# Patient Record
Sex: Male | Born: 1960
Health system: Southern US, Community
[De-identification: ages and names within clinical notes are randomized; demographics above are authoritative.]

## PROBLEM LIST (undated history)

## (undated) DIAGNOSIS — E7800 Pure hypercholesterolemia, unspecified: Secondary | ICD-10-CM

## (undated) DIAGNOSIS — I10 Essential (primary) hypertension: Secondary | ICD-10-CM

## (undated) DIAGNOSIS — E785 Hyperlipidemia, unspecified: Secondary | ICD-10-CM

## (undated) HISTORY — PX: APPENDECTOMY: SHX54

---

## 2010-01-04 ENCOUNTER — Encounter: Admission: RE | Admit: 2010-01-04 | Discharge: 2010-01-27 | Payer: Self-pay | Admitting: Internal Medicine

## 2010-10-09 ENCOUNTER — Ambulatory Visit: Payer: Self-pay | Admitting: Family Medicine

## 2010-10-09 DIAGNOSIS — J069 Acute upper respiratory infection, unspecified: Secondary | ICD-10-CM | POA: Insufficient documentation

## 2010-10-09 DIAGNOSIS — E785 Hyperlipidemia, unspecified: Secondary | ICD-10-CM

## 2010-10-11 ENCOUNTER — Telehealth (INDEPENDENT_AMBULATORY_CARE_PROVIDER_SITE_OTHER): Payer: Self-pay

## 2010-12-27 NOTE — Progress Notes (Signed)
Summary: Courtesy call  Phone Note Outgoing Call   Call placed by: Areta Haber CMA,  October 11, 2010 12:10 PM Call placed to: Patient Summary of Call: Courtesy call to pt - Wife states spouse emailed Dr. Cathren Harsh re cough. She does not know response. Wife stated she would let him know that we had called. Initial call taken by: Areta Haber CMA,  October 11, 2010 12:17 PM

## 2010-12-27 NOTE — Assessment & Plan Note (Signed)
Summary: Runny nose.cough - clear,dry, sinus press/HA/ST x 3 dys rm 5   Vital Signs:  Patient Profile:   50 Years Old Male CC:      Cold & URI symptoms Height:     73 inches Weight:      173 pounds O2 Sat:      100 % O2 treatment:    Oxygen Temp:     98.4 degrees F oral Pulse rate:   81 / minute Pulse rhythm:   regular Resp:     18 per minute BP sitting:   144 / 89  (left arm) Cuff size:   regular  Vitals Entered By: Areta Haber CMA (October 09, 2010 12:16 PM)                  Current Allergies: No known allergies History of Present Illness Chief Complaint: Cold & URI symptoms History of Present Illness:    Subjective: Patient complains of onset of URI symptoms 3 days ago with mild sore throat and nasal congestion, followed by a mild cough which is now worse at night.  His wife developed similar symptoms several days earlier.   No pleuritic pain No wheezing ? post-nasal drainage No sinus pain/pressure No itchy/red eyes No earache No hemoptysis No SOB No fever/chills No nausea No vomiting No abdominal pain No diarrhea No skin rashes + mild fatigue No myalgias No headache    Current Problems: UPPER RESPIRATORY INFECTION, VIRAL (ICD-465.9) FAMILY HISTORY OF CAD MALE 1ST DEGREE RELATIVE <60 (ICD-V16.49) HYPERLIPIDEMIA (ICD-272.4)   Current Meds LIPITOR 10 MG TABS (ATORVASTATIN CALCIUM) 1 tab by mouth once daily MUCINEX DM 30-600 MG XR12H-TAB (DEXTROMETHORPHAN-GUAIFENESIN) as directed BENZONATATE 200 MG CAPS (BENZONATATE) One by mouth hs as needed cough  REVIEW OF SYSTEMS Constitutional Symptoms       Complains of fatigue.     Denies fever, chills, night sweats, weight loss, and weight gain.  Eyes       Denies change in vision, eye pain, eye discharge, glasses, contact lenses, and eye surgery. Ear/Nose/Throat/Mouth       Complains of sinus problems and sore throat.      Denies hearing loss/aids, change in hearing, ear pain, ear discharge,  dizziness, frequent runny nose, frequent nose bleeds, hoarseness, and tooth pain or bleeding.      Comments: x 3 dys Respiratory       Complains of dry cough.      Denies productive cough, wheezing, shortness of breath, asthma, bronchitis, and emphysema/COPD.  Cardiovascular       Denies murmurs, chest pain, and tires easily with exhertion.    Gastrointestinal       Denies stomach pain, nausea/vomiting, diarrhea, constipation, blood in bowel movements, and indigestion. Genitourniary       Denies painful urination, kidney stones, and loss of urinary control. Neurological       Complains of headaches.      Denies paralysis, seizures, and fainting/blackouts. Musculoskeletal       Denies muscle pain, joint pain, joint stiffness, decreased range of motion, redness, swelling, muscle weakness, and gout.  Skin       Denies bruising, unusual mles/lumps or sores, and hair/skin or nail changes.  Psych       Denies mood changes, temper/anger issues, anxiety/stress, speech problems, depression, and sleep problems. Other Comments: Pt has not seen his PCP for this.   Past History:  Past Medical History: Hyperlipidemia  Past Surgical History: Appendectomy  Family History: Family History of Arthritis Family  History of CAD Male 1st degree relative <60 Family History High cholesterol Family History Hypertension Family History Kidney disease  Social History: Married Never Smoked Alcohol use-yes - 1 weekly Drug use-no Regular exercise-yes Smoking Status:  never Does Patient Exercise:  yes Drug Use:  no   Objective:  Appearance:  Patient appears healthy, stated age, and in no acute distress  Eyes:  Pupils are equal, round, and reactive to light and accomdation.  Extraocular movement is intact.  Conjunctivae are not inflamed.  Ears:  Canals normal.  Tympanic membranes normal.   Nose:  Normal septum.  Normal turbinates, mildly congested.    No sinus tenderness present. Pharynx:  Normal    Neck:  Supple.  No adenopathy is present.  No thyromegaly is present  Lungs:  Clear to auscultation.  Breath sounds are equal.  Heart:  Regular rate and rhythm without murmurs, rubs, or gallops.  Abdomen:  Nontender without masses or hepatosplenomegaly.  Bowel sounds are present.  No CVA or flank tenderness.  Extremities:  No edema.   Skin:  No rash Assessment New Problems: UPPER RESPIRATORY INFECTION, VIRAL (ICD-465.9) FAMILY HISTORY OF CAD MALE 1ST DEGREE RELATIVE <60 (ICD-V16.49) HYPERLIPIDEMIA (ICD-272.4)  NO EVIDENCE BACTERIAL INFECTION TODAY  Plan New Medications/Changes: BENZONATATE 200 MG CAPS (BENZONATATE) One by mouth hs as needed cough  #12 x 0, 10/09/2010, Donna Christen MD  New Orders: New Patient Level III [phone] Planning Comments:   Treat symptomatically for now:  Increase fluid intake, begin expectorant/decongestant, cough suppressant at bedtime.  Call if not improving within one week or if symptoms worsen.   The patient and/or caregiver has been counseled thoroughly with regard to medications prescribed including dosage, schedule, interactions, rationale for use, and possible side effects and they verbalize understanding.  Diagnoses and expected course of recovery discussed and will return if not improved as expected or if the condition worsens. Patient and/or caregiver verbalized understanding.  Prescriptions: BENZONATATE 200 MG CAPS (BENZONATATE) One by mouth hs as needed cough  #12 x 0   Entered and Authorized by:   Donna Christen MD   Signed by:   Donna Christen MD on 10/09/2010   Method used:   Print then Give to Patient   RxID:   [id number]   Patient Instructions: 1)  May use Mucinex D (guaifenesin with decongestant) twice daily for congestion. 2)  Increase fluid intake, rest. 3)  May use Afrin nasal spray (or generic oxymetazoline) twice daily for about 5 days.  Also recommend using saline nasal spray several times daily and/or saline nasal  irrigation. 4)  Call if not improving 7 to 10 days.  Orders Added: 1)  New Patient Level III [95621]

## 2011-02-03 ENCOUNTER — Encounter: Payer: Self-pay | Admitting: Emergency Medicine

## 2011-02-03 ENCOUNTER — Ambulatory Visit (INDEPENDENT_AMBULATORY_CARE_PROVIDER_SITE_OTHER): Payer: Commercial Managed Care - PPO | Admitting: Emergency Medicine

## 2011-02-03 DIAGNOSIS — J069 Acute upper respiratory infection, unspecified: Secondary | ICD-10-CM

## 2011-02-03 LAB — CONVERTED CEMR LAB: Rapid Strep: NEGATIVE

## 2011-02-04 ENCOUNTER — Encounter: Payer: Self-pay | Admitting: Emergency Medicine

## 2011-02-06 ENCOUNTER — Telehealth (INDEPENDENT_AMBULATORY_CARE_PROVIDER_SITE_OTHER): Payer: Self-pay | Admitting: *Deleted

## 2011-02-07 NOTE — Assessment & Plan Note (Signed)
Summary: SORE THROAT, HEADACHE Room 5   Vital Signs:  Patient Profile:   50 Years Old Male CC:      Sore throat, hedache x 3 days Height:     73 inches Weight:      165 pounds O2 Sat:      100 % O2 treatment:    Room Air Temp:     98.0 degrees F oral Pulse rate:   73 / minute Pulse rhythm:   regular Resp:     12 per minute BP sitting:   147 / 91  (left arm) Cuff size:   regular  Vitals Entered By: Emilio Math (February 03, 2011 10:22 AM)                  Current Allergies: No known allergies History of Present Illness History from: patient Chief Complaint: Sore throat, hedache x 3 days History of Present Illness: 50 Years Old Male complains of onset of cold symptoms for 3 days.  Larry [name] has been using no OTC meds. ++ sore throat No cough No pleuritic pain No wheezing + nasal congestion + post-nasal drainage No sinus pain/pressure No chest congestion No itchy/red eyes No earache No hemoptysis No SOB No chills/sweats No fever No nausea No vomiting No abdominal pain No diarrhea No skin rashes No fatigue No myalgias No headache   REVIEW OF SYSTEMS Constitutional Symptoms      Denies fever, chills, night sweats, weight loss, weight gain, and fatigue.  Eyes       Denies change in vision, eye pain, eye discharge, glasses, contact lenses, and eye surgery. Ear/Nose/Throat/Mouth       Complains of sore throat.      Denies hearing loss/aids, change in hearing, ear pain, ear discharge, dizziness, frequent runny nose, frequent nose bleeds, sinus problems, hoarseness, and tooth pain or bleeding.  Respiratory       Denies dry cough, productive cough, wheezing, shortness of breath, asthma, bronchitis, and emphysema/COPD.  Cardiovascular       Denies murmurs, chest pain, and tires easily with exhertion.    Gastrointestinal       Denies stomach pain, nausea/vomiting, diarrhea, constipation, blood in bowel movements, and indigestion. Genitourniary       Denies painful  urination, kidney stones, and loss of urinary control. Neurological       Complains of headaches.      Denies paralysis, seizures, and fainting/blackouts. Musculoskeletal       Denies muscle pain, joint pain, joint stiffness, decreased range of motion, redness, swelling, muscle weakness, and gout.  Skin       Denies bruising, unusual mles/lumps or sores, and hair/skin or nail changes.  Psych       Denies mood changes, temper/anger issues, anxiety/stress, speech problems, depression, and sleep problems.  Past History:  Past Medical History: Reviewed history from 10/09/2010 and no changes required. Hyperlipidemia  Past Surgical History: Reviewed history from 10/09/2010 and no changes required. Appendectomy  Family History: Reviewed history from 10/09/2010 and no changes required. Family History of Arthritis Family History of CAD Male 1st degree relative <60 Family History High cholesterol Family History Hypertension Family History Kidney disease  Social History: Reviewed history from 10/09/2010 and no changes required. Married Never Smoked Alcohol use-yes - 1 weekly Drug use-no Regular exercise-yes [place] Physical Exam General appearance: well developed, well nourished, no acute distress Ears: normal, no lesions or deformities Nasal: mucosa pink, nonedematous, no septal deviation, turbinates normal Oral/Pharynx: tongue normal, posterior pharynx  without erythema or exudate Chest/Lungs: no rales, wheezes, or rhonchi bilateral, breath sounds equal without effort Heart: regular rate and  rhythm, no murmur MSE: oriented to time, place, and person Assessment New Problems: UPPER RESPIRATORY INFECTION, ACUTE (ICD-465.9)   Plan New Medications/Changes: AMOXICILLIN 875 MG TABS (AMOXICILLIN) 1 by mouth two times a day for 7 days  #14 x 0, 02/03/2011, Hoyt Koch MD  New Orders: Est. Patient Level IV [16109] Rapid Strep [87880] T-Culture, Throat  [[id number]] Planning Comments:   Rapid strep negative 1)  Take the prescribed antibiotic as instructed.  Hold for a few days since is currently just viral. 2)  Use nasal saline solution (over the counter) at least 3 times a day. 3)  Use over the counter decongestants like Zyrtec-D every 12 hours as needed to help with congestion. 4)  Can take tylenol every 6 hours or motrin every 8 hours for pain or fever. 5)  Follow up with your primary doctor  if no improvement in 5-7 days, sooner if increasing pain, fever, or new symptoms.    The patient and/or caregiver has been counseled thoroughly with regard to medications prescribed including dosage, schedule, interactions, rationale for use, and possible side effects and they verbalize understanding.  Diagnoses and expected course of recovery discussed and will return if not improved as expected or if the condition worsens. Patient and/or caregiver verbalized understanding.  Prescriptions: AMOXICILLIN 875 MG TABS (AMOXICILLIN) 1 by mouth two times a day for 7 days  #14 x 0   Entered and Authorized by:   Hoyt Koch MD   Signed by:   Hoyt Koch MD on 02/03/2011   Method used:   Print then Give to Patient   RxID:   [id number]   Orders Added: 1)  Est. Patient Level IV [78469] 2)  Rapid Strep [62952] 3)  T-Culture, Throat [[id number]]    Laboratory Results  Date/Time Received: February 03, 2011 10:41 AM  Date/Time Reported: February 03, 2011 10:41 AM   Other Tests  Rapid Strep: negative

## 2011-02-14 NOTE — Progress Notes (Signed)
  Phone Note Outgoing Call Call back at Home Phone [phone] [facility]     Call placed by: Lajean Saver RN,  February 06, 2011 3:24 PM Call placed to: Patient Summary of Call: Patient unavailable. Spoke with patient's wife who reports he is doing better after fill his Rx. I gave her his negative throat culture result

## 2014-04-15 ENCOUNTER — Emergency Department
Admission: EM | Admit: 2014-04-15 | Discharge: 2014-04-15 | Disposition: A | Discharge status: Home / Self Care | Payer: 59 | Source: Home / Self Care | Attending: Family Medicine | Admitting: Family Medicine

## 2014-04-15 ENCOUNTER — Encounter: Payer: Self-pay | Admitting: Emergency Medicine

## 2014-04-15 DIAGNOSIS — B9789 Other viral agents as the cause of diseases classified elsewhere: Principal | ICD-10-CM

## 2014-04-15 DIAGNOSIS — J069 Acute upper respiratory infection, unspecified: Secondary | ICD-10-CM

## 2014-04-15 HISTORY — DX: Hyperlipidemia, unspecified: E78.5

## 2014-04-15 LAB — POCT RAPID STREP A (OFFICE): Rapid Strep A Screen: NEGATIVE

## 2014-04-15 MED ORDER — BENZONATATE 200 MG PO CAPS: 200.0000 mg | ORAL_CAPSULE | Freq: Every day | ORAL | Status: DC

## 2014-04-15 MED ORDER — AZITHROMYCIN 250 MG PO TABS: ORAL_TABLET | ORAL | Status: DC

## 2014-04-15 NOTE — ED Notes (Signed)
Pt c/o sore throat, cough, RT ear pain, and HA x 2 days. Denies fever.

## 2014-04-15 NOTE — Discharge Instructions (Signed)
Take plain Mucinex (1200 mg guaifenesin) twice daily for cough and congestion.  May add Sudafed for sinus congestion.   Increase fluid intake, rest. °May use Afrin nasal spray (or generic oxymetazoline) twice daily for about 5 days.  Also recommend using saline nasal spray several times daily and saline nasal irrigation (AYR is a common brand) °Try warm salt water gargles for sore throat.  °Stop all antihistamines for now, and other non-prescription cough/cold preparations. °Begin Azithromycin if not improving about one week or if persistent fever develops   °Follow-up with family doctor if not improving about10 days.  °

## 2014-04-15 NOTE — ED Provider Notes (Signed)
CSN: [id number]     Arrival date & time 04/15/14  1839 History   First MD Initiated Contact with Patient 04/15/14 1859     Chief Complaint  Patient presents with  . Nasal Congestion      HPI Comments: Patient complains of two day history of mild sore throat, nasal congestion, and cough.  The history is provided by the patient.    Past Medical History  Diagnosis Date  . Hyperlipidemia    Past Surgical History  Procedure Laterality Date  . Appendectomy     Family History  Problem Relation Age of Onset  . Hyperlipidemia Mother   . Hyperlipidemia Father    History  Substance Use Topics  . Smoking status: Never Smoker   . Smokeless tobacco: Not on file  . Alcohol Use: Yes     Comment: 2 q wk    Review of Systems + sore throat + cough No pleuritic pain No wheezing + nasal congestion + post-nasal drainage No sinus pain/pressure No itchy/red eyes No earache No hemoptysis No SOB No fever/chills No nausea No vomiting No abdominal pain No diarrhea No urinary symptoms No skin rash + fatigue No myalgias + headache Used OTC meds without relief  Allergies  Review of patient's allergies indicates no known allergies.  Home Medications   Prior to Admission medications   Not on File   BP 154/93  Pulse 76  Temp(Src) 98.2 F (36.8 C) (Oral)  Resp 18  Ht 6\' 1"  (1.854 m)  Wt 190 lb (86.183 kg)  BMI 25.07 kg/m2  SpO2 100% Physical Exam Nursing notes and Vital Signs reviewed. Appearance:  Patient appears healthy, stated age, and in no acute distress Eyes:  Pupils are equal, round, and reactive to light and accomodation.  Extraocular movement is intact.  Conjunctivae are not inflamed  Ears:  Canals normal.  Tympanic membranes normal.  Nose:  Mildly congested turbinates.  No sinus tenderness.   Pharynx:   Minimal erythema.  Uvula slightly erythematous Neck:  Supple.  Slightly tender shotty posterior nodes are palpated bilaterally  Lungs:  Clear to auscultation.   Breath sounds are equal.  Heart:  Regular rate and rhythm without murmurs, rubs, or gallops.  Abdomen:  Nontender without masses or hepatosplenomegaly.  Bowel sounds are present.  No CVA or flank tenderness.  Extremities:  No edema.  No calf tenderness Skin:  No rash present.   ED Course  Procedures  none    Labs Reviewed  POCT RAPID STREP A (OFFICE) negative         MDM   1. Viral URI with cough    Prescription written for Benzonatate (Tessalon) to take at bedtime for night-time cough.  There is no evidence of bacterial infection today.  Treat symptomatically for now:  Take plain Mucinex (1200 mg guaifenesin) twice daily for cough and congestion.  May add Sudafed for sinus congestion.   Increase fluid intake, rest. May use Afrin nasal spray (or generic oxymetazoline) twice daily for about 5 days.  Also recommend using saline nasal spray several times daily and saline nasal irrigation (AYR is a common brand) Try warm salt water gargles for sore throat.  Stop all antihistamines for now, and other non-prescription cough/cold preparations. Begin Azithromycin if not improving about one week or if persistent fever develops (Given a prescription to hold) Follow-up with family doctor if not improving about10 days.     Kandra Nicolas, MD 04/20/14 1136

## 2014-04-20 ENCOUNTER — Telehealth: Payer: Self-pay | Admitting: *Deleted

## 2014-09-07 ENCOUNTER — Telehealth: Payer: Self-pay

## 2014-09-07 NOTE — Telephone Encounter (Signed)
Scheduled patient for a New sport medicine appointment.

## 2014-09-08 ENCOUNTER — Ambulatory Visit (INDEPENDENT_AMBULATORY_CARE_PROVIDER_SITE_OTHER): Payer: 59 | Admitting: Sports Medicine

## 2014-09-08 ENCOUNTER — Ambulatory Visit (INDEPENDENT_AMBULATORY_CARE_PROVIDER_SITE_OTHER): Payer: 59

## 2014-09-08 ENCOUNTER — Encounter: Payer: Self-pay | Admitting: Sports Medicine

## 2014-09-08 VITALS — BP 132/81 | HR 77 | Ht 74.0 in | Wt 192.0 lb

## 2014-09-08 DIAGNOSIS — M25572 Pain in left ankle and joints of left foot: Secondary | ICD-10-CM

## 2014-09-08 DIAGNOSIS — M79672 Pain in left foot: Secondary | ICD-10-CM | POA: Insufficient documentation

## 2014-09-08 NOTE — Assessment & Plan Note (Signed)
3 weeks post stubbing his toe. Pain is at the distal neck of the fifth metatarsal. This is likely a simple sprain. Toes were buddy taped together. X-rays. NSAIDs as needed. Return in 3 weeks.

## 2014-09-08 NOTE — Progress Notes (Signed)
   Subjective:    I'm seeing this patient as a consultation for:  Dr. Morene Crocker  CC: Left foot pain  HPI: This is a very pleasant 53 year old 3 weeks ago he kicked a box with his left foot, had immediate pain but no swelling or bruising at the neck of his fifth metatarsal. Pain has been persistent, and he presents here for further evaluation and definitive treatment.  Past medical history, Surgical history, Family history not pertinant except as noted below, Social history, Allergies, and medications have been entered into the medical record, reviewed, and no changes needed.   Review of Systems: No headache, visual changes, nausea, vomiting, diarrhea, constipation, dizziness, abdominal pain, skin rash, fevers, chills, night sweats, weight loss, swollen lymph nodes, body aches, joint swelling, muscle aches, chest pain, shortness of breath, mood changes, visual or auditory hallucinations.   Objective:   General: Well Developed, well nourished, and in no acute distress.  Neuro/Psych: Alert and oriented x3, extra-ocular muscles intact, able to move all 4 extremities, sensation grossly intact. Skin: Warm and dry, no rashes noted.  Respiratory: Not using accessory muscles, speaking in full sentences, trachea midline.  Cardiovascular: Pulses palpable, no extremity edema. Abdomen: Does not appear distended. Left Foot: No visible erythema or swelling. Range of motion is full in all directions. Strength is 5/5 in all directions. No hallux valgus. Pes planus. No abnormal callus noted. No pain over the navicular prominence, or base of fifth metatarsal. No tenderness to palpation of the calcaneal insertion of plantar fascia. No pain at the Achilles insertion. No pain over the calcaneal bursa. No pain of the retrocalcaneal bursa. Tender to palpation at the neck of the fifth metatarsal No hallux rigidus or limitus. No tenderness palpation over interphalangeal joints. No pain with compression  of the metatarsal heads. Neurovascularly intact distally.  Fourth and fifth toes were buddy taped together  Impression and Recommendations:   This case required medical decision making of moderate complexity.

## 2014-09-29 ENCOUNTER — Encounter: Payer: Self-pay | Admitting: Sports Medicine

## 2014-09-29 ENCOUNTER — Ambulatory Visit (INDEPENDENT_AMBULATORY_CARE_PROVIDER_SITE_OTHER): Payer: 59

## 2014-09-29 ENCOUNTER — Ambulatory Visit (INDEPENDENT_AMBULATORY_CARE_PROVIDER_SITE_OTHER): Payer: 59 | Admitting: Sports Medicine

## 2014-09-29 VITALS — BP 137/75 | HR 76 | Ht 73.0 in | Wt 192.0 lb

## 2014-09-29 DIAGNOSIS — M545 Low back pain, unspecified: Secondary | ICD-10-CM

## 2014-09-29 DIAGNOSIS — M5416 Radiculopathy, lumbar region: Secondary | ICD-10-CM | POA: Insufficient documentation

## 2014-09-29 DIAGNOSIS — M79672 Pain in left foot: Secondary | ICD-10-CM

## 2014-09-29 NOTE — Assessment & Plan Note (Signed)
Sounds discogenic, initially had some left-sided radicular symptoms but todayrecalls it more on the right side. Home rehabilitation exercises, x-rays, he does have some Flexeril which seems to help. Return as needed for this.

## 2014-09-29 NOTE — Assessment & Plan Note (Signed)
Resolved, return as needed 

## 2014-09-29 NOTE — Progress Notes (Signed)
  Subjective:    CC: follow-up  HPI: Foot sprain: Pain is resolved.  Back pain: Present for years, moderate, persistent, did have some left-sided radicular symptoms in the past but currently resolved, pain is predominantly axial and right-sided worse with flexion. No bowel or bladder dysfunction, no constitutional symptoms.  Past medical history, Surgical history, Family history not pertinant except as noted below, Social history, Allergies, and medications have been entered into the medical record, reviewed, and no changes needed.   Review of Systems: No fevers, chills, night sweats, weight loss, chest pain, or shortness of breath.   Objective:    General: Well Developed, well nourished, and in no acute distress.  Neuro: Alert and oriented x3, extra-ocular muscles intact, sensation grossly intact.  HEENT: Normocephalic, atraumatic, pupils equal round reactive to light, neck supple, no masses, no lymphadenopathy, thyroid nonpalpable.  Skin: Warm and dry, no rashes. Cardiac: Regular rate and rhythm, no murmurs rubs or gallops, no lower extremity edema.  Respiratory: Clear to auscultation bilaterally. Not using accessory muscles, speaking in full sentences. leftFoot: No visible erythema or swelling. Range of motion is full in all directions. Strength is 5/5 in all directions. No hallux valgus. No pes cavus or pes planus. No abnormal callus noted. No pain over the navicular prominence, or base of fifth metatarsal. No tenderness to palpation of the calcaneal insertion of plantar fascia. No pain at the Achilles insertion. No pain over the calcaneal bursa. No pain of the retrocalcaneal bursa. No tenderness to palpation over the tarsals, metatarsals, or phalanges. No hallux rigidus or limitus. No tenderness palpation over interphalangeal joints. No pain with compression of the metatarsal heads. Neurovascularly intact distally.  X-rays personally reviewed and show L3-S1 degenerative  disc disease.  Impression and Recommendations:

## 2015-02-05 ENCOUNTER — Encounter: Payer: Self-pay | Admitting: Sports Medicine

## 2015-02-05 ENCOUNTER — Ambulatory Visit (INDEPENDENT_AMBULATORY_CARE_PROVIDER_SITE_OTHER): Payer: 59 | Admitting: Sports Medicine

## 2015-02-05 DIAGNOSIS — M5416 Radiculopathy, lumbar region: Secondary | ICD-10-CM

## 2015-02-05 DIAGNOSIS — M5417 Radiculopathy, lumbosacral region: Secondary | ICD-10-CM

## 2015-02-05 MED ORDER — PREDNISONE 50 MG PO TABS
ORAL_TABLET | ORAL | Status: DC
Start: 2015-02-05 — End: 2015-03-04

## 2015-02-05 MED ORDER — CYCLOBENZAPRINE HCL 10 MG PO TABS: ORAL_TABLET | ORAL | Status: DC

## 2015-02-05 NOTE — Assessment & Plan Note (Signed)
Clinically represents right L4. Overall does well, has a flare recently. Herniated disc rehabilitation, prednisone, Flexeril at bedtime, over-the-counter analgesics. X-ray does show L3-S1 moderate to severe degenerative changes. Return to see me in a month, MRI for intervention if no better.

## 2015-02-05 NOTE — Progress Notes (Signed)
  Subjective:    CC: Flare of back pain  HPI: This is a pleasant 54 year old male, he has known lumbar degenerative disc disease at multiple levels, more recently he overdid it and developed pain in his back worse with sitting, flexion, Valsalva with radiation to the right anterior thigh but not past the knee. Pain is moderate, persistent without constitutional symptoms, trauma, or bowel or bladder dysfunction.  Past medical history, Surgical history, Family history not pertinant except as noted below, Social history, Allergies, and medications have been entered into the medical record, reviewed, and no changes needed.   Review of Systems: No fevers, chills, night sweats, weight loss, chest pain, or shortness of breath.   Objective:    General: Well Developed, well nourished, and in no acute distress.  Neuro: Alert and oriented x3, extra-ocular muscles intact, sensation grossly intact.  HEENT: Normocephalic, atraumatic, pupils equal round reactive to light, neck supple, no masses, no lymphadenopathy, thyroid nonpalpable.  Skin: Warm and dry, no rashes. Cardiac: Regular rate and rhythm, no murmurs rubs or gallops, no lower extremity edema.  Respiratory: Clear to auscultation bilaterally. Not using accessory muscles, speaking in full sentences. Back Exam:  Inspection: Unremarkable  Motion: Flexion 45 deg, Extension 45 deg, Side Bending to 45 deg bilaterally,  Rotation to 45 deg bilaterally  SLR laying: Negative  XSLR laying: Negative  Palpable tenderness: None. FABER: negative. Sensory change: Gross sensation intact to all lumbar and sacral dermatomes.  Reflexes: 2+ at both patellar tendons, 2+ at achilles tendons, Babinski's downgoing.  Strength at foot  Plantar-flexion: 5/5 Dorsi-flexion: 5/5 Eversion: 5/5 Inversion: 5/5  Leg strength  Quad: 5/5 Hamstring: 5/5 Hip flexor: 5/5 Hip abductors: 5/5  Gait unremarkable.  X-rays again reviewed and show multilevel degenerative disc disease  from L3-S1 worst at the L5-S1 level.  Impression and Recommendations:

## 2015-02-09 ENCOUNTER — Ambulatory Visit: Payer: 59 | Admitting: Sports Medicine

## 2015-03-04 ENCOUNTER — Ambulatory Visit (INDEPENDENT_AMBULATORY_CARE_PROVIDER_SITE_OTHER): Payer: 59 | Admitting: Sports Medicine

## 2015-03-04 ENCOUNTER — Encounter: Payer: Self-pay | Admitting: Sports Medicine

## 2015-03-04 VITALS — BP 141/86 | HR 95 | Ht 73.0 in | Wt 192.0 lb

## 2015-03-04 DIAGNOSIS — M5417 Radiculopathy, lumbosacral region: Secondary | ICD-10-CM

## 2015-03-04 DIAGNOSIS — M5416 Radiculopathy, lumbar region: Secondary | ICD-10-CM

## 2015-03-04 NOTE — Progress Notes (Signed)
  Subjective:    CC: follow-up  HPI: Check returns, he has multilevel lumbar degenerative disc disease with right-sided L4 and L3 radiculopathy, he had a flare in pain, we treated him conservatively with therapy, steroids, NSAIDs, and he returns with symptoms completely resolved after 3 weeks. Happy with results. He had a few questions about using chiropractors and acupuncturists which were answered.  Past medical history, Surgical history, Family history not pertinant except as noted below, Social history, Allergies, and medications have been entered into the medical record, reviewed, and no changes needed.   Review of Systems: No fevers, chills, night sweats, weight loss, chest pain, or shortness of breath.   Objective:    General: Well Developed, well nourished, and in no acute distress.  Neuro: Alert and oriented x3, extra-ocular muscles intact, sensation grossly intact.  HEENT: Normocephalic, atraumatic, pupils equal round reactive to light, neck supple, no masses, no lymphadenopathy, thyroid nonpalpable.  Skin: Warm and dry, no rashes. Cardiac: Regular rate and rhythm, no murmurs rubs or gallops, no lower extremity edema.  Respiratory: Clear to auscultation bilaterally. Not using accessory muscles, speaking in full sentences.  Impression and Recommendations:

## 2015-03-04 NOTE — Assessment & Plan Note (Signed)
Overall symptoms have completely resolved, he does have extensive lumbar degenerative disc disease. For future flares we can certainly call in medication and physical therapy. Prominent radicular component is right L3 and L4.

## 2015-07-15 ENCOUNTER — Encounter: Payer: Self-pay | Admitting: Sports Medicine

## 2015-07-15 ENCOUNTER — Ambulatory Visit (INDEPENDENT_AMBULATORY_CARE_PROVIDER_SITE_OTHER): Payer: 59 | Admitting: Sports Medicine

## 2015-07-15 DIAGNOSIS — M5417 Radiculopathy, lumbosacral region: Secondary | ICD-10-CM | POA: Diagnosis not present

## 2015-07-15 DIAGNOSIS — M5416 Radiculopathy, lumbar region: Secondary | ICD-10-CM

## 2015-07-15 MED ORDER — PREDNISONE 10 MG (21) PO TBPK: ORAL_TABLET | ORAL | Status: DC

## 2015-07-15 NOTE — Progress Notes (Signed)
  Subjective:    CC: follow-up  HPI: Larry [name] returns, he had a fantasticresponse to conservative treatment for lumbar radiculitis in the recent past, unfortunately for the past couple of weeks he's had increasing pain with numbness and tingling on the right leg, anterior shin as well as lateral leg. Minimal back pain.symptoms are moderate, persistent, no constitutional symptoms or bowel or bladder dysfunction.  Past medical history, Surgical history, Family history not pertinant except as noted below, Social history, Allergies, and medications have been entered into the medical record, reviewed, and no changes needed.   Review of Systems: No fevers, chills, night sweats, weight loss, chest pain, or shortness of breath.   Objective:    General: Well Developed, well nourished, and in no acute distress.  Neuro: Alert and oriented x3, extra-ocular muscles intact, sensation grossly intact.  HEENT: Normocephalic, atraumatic, pupils equal round reactive to light, neck supple, no masses, no lymphadenopathy, thyroid nonpalpable.  Skin: Warm and dry, no rashes. Cardiac: Regular rate and rhythm, no murmurs rubs or gallops, no lower extremity edema.  Respiratory: Clear to auscultation bilaterally. Not using accessory muscles, speaking in full sentences. Back Exam:  Inspection: Unremarkable  Motion: Flexion 45 deg, Extension 45 deg, Side Bending to 45 deg bilaterally,  Rotation to 45 deg bilaterally  SLR laying: positive with reproduction of right sided radicular symptoms. XSLR laying: Negative  Palpable tenderness: None. FABER: negative. Sensory change: Gross sensation intact to all lumbar and sacral dermatomes.  Reflexes: 2+ at both patellar tendons, 2+ at achilles tendons, Babinski's downgoing.  Strength at foot  Plantar-flexion: 5/5 Dorsi-flexion: 5/5 Eversion: 5/5 Inversion: 5/5  Leg strength  Quad: 5/5 Hamstring: 5/5 Hip flexor: 5/5 Hip abductors: 5/5  Gait unremarkable.  Impression and  Recommendations:

## 2015-07-15 NOTE — Assessment & Plan Note (Signed)
Persistence of right-sided L5 radiculopathy. We are going to go with another conservative course of tapering prednisone, and herniated disc rehabilitation exercises, at this point however I am going to obtain an MRI for further evaluation of clinical right-sided L5 radiculopathy. Next line return to go over MRI results.

## 2015-07-23 ENCOUNTER — Encounter: Payer: Self-pay | Admitting: Sports Medicine

## 2015-08-02 ENCOUNTER — Encounter: Payer: Self-pay | Admitting: Sports Medicine

## 2015-08-03 MED ORDER — CYCLOBENZAPRINE HCL 10 MG PO TABS: ORAL_TABLET | ORAL | Status: DC

## 2015-08-03 MED ORDER — MELOXICAM 15 MG PO TABS: ORAL_TABLET | ORAL | Status: DC

## 2015-08-04 ENCOUNTER — Encounter: Payer: Self-pay | Admitting: Sports Medicine

## 2015-08-04 NOTE — Telephone Encounter (Signed)
Please look into this. Still waiting to hear from [place] regarding his MRI.

## 2015-08-05 NOTE — Telephone Encounter (Signed)
Pt preferred location, [facility]. Spoke with them, they will call to contact Pt now.

## 2015-08-05 NOTE — Telephone Encounter (Signed)
Thank you so much, [place].

## 2015-09-08 ENCOUNTER — Encounter: Payer: Self-pay | Admitting: Sports Medicine

## 2015-09-09 MED ORDER — DIAZEPAM 5 MG PO TABS: ORAL_TABLET | ORAL | Status: DC

## 2015-09-10 ENCOUNTER — Inpatient Hospital Stay: Admission: RE | Admit: 2015-09-10 | Payer: 59 | Source: Ambulatory Visit

## 2015-09-29 ENCOUNTER — Ambulatory Visit
Admission: RE | Admit: 2015-09-29 | Discharge: 2015-09-29 | Disposition: A | Discharge status: Home / Self Care | Payer: 59 | Source: Ambulatory Visit | Attending: Sports Medicine | Admitting: Sports Medicine

## 2015-09-29 DIAGNOSIS — M5416 Radiculopathy, lumbar region: Secondary | ICD-10-CM

## 2015-10-10 ENCOUNTER — Encounter: Payer: Self-pay | Admitting: Emergency Medicine

## 2015-10-10 ENCOUNTER — Emergency Department
Admission: EM | Admit: 2015-10-10 | Discharge: 2015-10-10 | Disposition: A | Discharge status: Home / Self Care | Payer: 59 | Source: Home / Self Care | Attending: Family Medicine | Admitting: Family Medicine

## 2015-10-10 DIAGNOSIS — J029 Acute pharyngitis, unspecified: Secondary | ICD-10-CM | POA: Diagnosis not present

## 2015-10-10 LAB — POCT RAPID STREP A (OFFICE): Rapid Strep A Screen: NEGATIVE

## 2015-10-10 MED ORDER — AZITHROMYCIN 250 MG PO TABS: ORAL_TABLET | ORAL | Status: DC

## 2015-10-10 MED ORDER — BENZONATATE 200 MG PO CAPS: 200.0000 mg | ORAL_CAPSULE | Freq: Every day | ORAL | Status: DC

## 2015-10-10 NOTE — Discharge Instructions (Signed)
As cold symptoms develop, try the following: Take plain guaifenesin (1200mg  extended release tabs such as Mucinex) twice daily, with plenty of water, for cough and congestion.  May add Pseudoephedrine (30mg , one or two every 4 to 6 hours) for sinus congestion.  Get adequate rest.   May use Afrin nasal spray (or generic oxymetazoline) twice daily for about 5 days and then discontinue.  Also recommend using saline nasal spray several times daily and saline nasal irrigation (AYR is a common brand).   Try warm salt water gargles for sore throat.  Stop all antihistamines for now, and other non-prescription cough/cold preparations. May take Ibuprofen 200mg , 4 tabs every 8 hours with food for chest/sternum discomfort, sore throat, headache, etc. Begin Azithromycin if not improving about one week or if persistent fever develops   Follow-up with family doctor if not improving about10 days.

## 2015-10-10 NOTE — ED Provider Notes (Signed)
CSN: [id number]     Arrival date & time 10/10/15  1425 History   First MD Initiated Contact with Patient 10/10/15 1556     Chief Complaint  Patient presents with  . Nasal Congestion  . Sore Throat      HPI Comments:  Patient complains of two day history of early cold-like symptoms including mild sore throat, sinus congestion, fatigue, and right earache.   The history is provided by the patient.    Past Medical History  Diagnosis Date  . Hyperlipidemia    Past Surgical History  Procedure Laterality Date  . Appendectomy     Family History  Problem Relation Age of Onset  . Hyperlipidemia Mother   . Hyperlipidemia Father    Social History  Substance Use Topics  . Smoking status: Never Smoker   . Smokeless tobacco: None  . Alcohol Use: Yes     Comment: 2 q wk    Review of Systems + sore throat No cough No pleuritic pain, but has soreness in anterior chest No wheezing + nasal congestion + post-nasal drainage No sinus pain/pressure No itchy/red eyes ? earache No hemoptysis No SOB No fever/chills No nausea No vomiting No abdominal pain No diarrhea No urinary symptoms No skin rash + fatigue No myalgias + headache Used OTC meds without relief  Allergies  Review of patient's allergies indicates no known allergies.  Home Medications   Prior to Admission medications   Medication Sig Start Date End Date Taking? Authorizing Provider  atorvastatin (LIPITOR) 10 MG tablet Take 10 mg by mouth daily.    Historical Provider, MD  azithromycin (ZITHROMAX Z-PAK) 250 MG tablet Take 2 tabs today; then begin one tab once daily for 4 more days. 10/10/15   Kandra Nicolas, MD  benzonatate (TESSALON) 200 MG capsule Take 1 capsule (200 mg total) by mouth at bedtime. Take as needed for cough 10/10/15   Kandra Nicolas, MD  cyclobenzaprine (FLEXERIL) 10 MG tablet One half tab PO qHS, then increase gradually to one tab TID. 08/03/15   Silverio Decamp, MD  diazepam (VALIUM) 5 MG  tablet Take 1 tab PO 1 hour before procedure or imaging. 09/09/15   Silverio Decamp, MD  meloxicam (MOBIC) 15 MG tablet One tab PO qAM with breakfast for 2 weeks, then daily prn pain. 08/03/15   Silverio Decamp, MD  predniSONE (STERAPRED UNI-PAK 21 TAB) 10 MG (21) TBPK tablet 12 day taper pack, use as directed 07/15/15   Silverio Decamp, MD   Meds Ordered and Administered this Visit  Medications - No data to display  BP 126/78 mmHg  Pulse 98  Temp(Src) 99.6 F (37.6 C) (Oral)  Resp 16  Ht 6\' 1"  (1.854 m)  Wt 190 lb (86.183 kg)  BMI 25.07 kg/m2  SpO2 94% No data found.   Physical Exam Nursing notes and Vital Signs reviewed. Appearance:  Patient appears stated age, and in no acute distress Eyes:  Pupils are equal, round, and reactive to light and accomodation.  Extraocular movement is intact.  Conjunctivae are not inflamed  Ears:  Canals normal.  Tympanic membranes normal.  Nose:  Mildly congested turbinates.  No sinus tenderness.   Pharynx:   Uvula slightly erythematous Neck:  Supple.  Nontender enlarged posterior nodes are palpated bilaterally  Lungs:  Clear to auscultation.  Breath sounds are equal.  Moving air well. Heart:  Regular rate and rhythm without murmurs, rubs, or gallops.  Abdomen:  Nontender without masses or  hepatosplenomegaly.  Bowel sounds are present.  No CVA or flank tenderness.  Extremities:  No edema.  No calf tenderness Skin:  No rash present.   ED Course  Procedures  None    Labs Reviewed  POCT RAPID STREP A (OFFICE) negative     MDM   1. Acute pharyngitis, unspecified etiology; suspect early viral URI   There is no evidence of bacterial infection today.  Treat symptomatically for now  Throat culture pending Prescription written for Benzonatate (Tessalon) to take at bedtime for night-time cough.  As cold symptoms develop, try the following: Take plain guaifenesin (1200mg  extended release tabs such as Mucinex) twice daily, with  plenty of water, for cough and congestion.  May add Pseudoephedrine (30mg , one or two every 4 to 6 hours) for sinus congestion.  Get adequate rest.   May use Afrin nasal spray (or generic oxymetazoline) twice daily for about 5 days and then discontinue.  Also recommend using saline nasal spray several times daily and saline nasal irrigation (AYR is a common brand).   Try warm salt water gargles for sore throat.  Stop all antihistamines for now, and other non-prescription cough/cold preparations. May take Ibuprofen 200mg , 4 tabs every 8 hours with food for chest/sternum discomfort, sore throat, headache, etc. Begin Azithromycin if not improving about one week or if persistent fever develops (Given a prescription to hold, with an expiration date)  Follow-up with family doctor if not improving about10 days.     Kandra Nicolas, MD 10/16/15 [phone]

## 2015-10-10 NOTE — ED Notes (Signed)
Reports start of sore throat 2 days ago and now head congestion more prominent. No OTC today.

## 2016-01-04 DIAGNOSIS — L709 Acne, unspecified: Secondary | ICD-10-CM | POA: Diagnosis not present

## 2016-01-04 DIAGNOSIS — D239 Other benign neoplasm of skin, unspecified: Secondary | ICD-10-CM | POA: Diagnosis not present

## 2016-01-04 DIAGNOSIS — L720 Epidermal cyst: Secondary | ICD-10-CM | POA: Diagnosis not present

## 2016-02-21 MED FILL — SIMVASTATIN 20 MG TABLET: 20 | 90 days supply | Qty: 90 | Fill #1

## 2016-02-21 MED FILL — MINOCYCLINE 50 MG CAPSULE: 50 | 30 days supply | Qty: 60 | Fill #0

## 2016-04-19 MED FILL — MINOCYCLINE 50 MG CAPSULE: 50 | 30 days supply | Qty: 60 | Fill #1

## 2016-05-18 DIAGNOSIS — H6123 Impacted cerumen, bilateral: Secondary | ICD-10-CM | POA: Diagnosis not present

## 2016-06-19 MED FILL — MINOCYCLINE 50 MG CAPSULE: 50 | 30 days supply | Qty: 60 | Fill #2

## 2016-07-24 MED FILL — MINOCYCLINE 50 MG CAPSULE: 50 | 30 days supply | Qty: 60 | Fill #3

## 2016-07-24 MED FILL — SIMVASTATIN 20 MG TABLET: 20 | 90 days supply | Qty: 90 | Fill #2

## 2016-09-21 DIAGNOSIS — H00012 Hordeolum externum right lower eyelid: Secondary | ICD-10-CM | POA: Diagnosis not present

## 2016-09-22 MED FILL — NEO/POLYMYXIN/DEXAMETH DROP: 3.5-10000-0 | 10 days supply | Qty: 5 | Fill #0

## 2016-10-05 MED FILL — MINOCYCLINE 50 MG CAPSULE: 50 | 30 days supply | Qty: 60 | Fill #4

## 2016-10-10 DIAGNOSIS — F411 Generalized anxiety disorder: Secondary | ICD-10-CM | POA: Diagnosis not present

## 2016-10-10 DIAGNOSIS — M549 Dorsalgia, unspecified: Secondary | ICD-10-CM | POA: Diagnosis not present

## 2016-10-10 DIAGNOSIS — Z0000 Encounter for general adult medical examination without abnormal findings: Secondary | ICD-10-CM | POA: Diagnosis not present

## 2016-10-10 DIAGNOSIS — Z79899 Other long term (current) drug therapy: Secondary | ICD-10-CM | POA: Diagnosis not present

## 2016-10-10 DIAGNOSIS — E785 Hyperlipidemia, unspecified: Secondary | ICD-10-CM | POA: Diagnosis not present

## 2016-10-10 DIAGNOSIS — R413 Other amnesia: Secondary | ICD-10-CM | POA: Diagnosis not present

## 2016-10-10 DIAGNOSIS — Z23 Encounter for immunization: Secondary | ICD-10-CM | POA: Diagnosis not present

## 2016-10-10 DIAGNOSIS — R030 Elevated blood-pressure reading, without diagnosis of hypertension: Secondary | ICD-10-CM | POA: Diagnosis not present

## 2016-10-10 DIAGNOSIS — F419 Anxiety disorder, unspecified: Secondary | ICD-10-CM | POA: Diagnosis not present

## 2016-11-07 MED FILL — CYCLOBENZAPRINE 10 MG TAB: 10 | 15 days supply | Qty: 30 | Fill #0

## 2016-11-07 MED FILL — MELOXICAM 15 MG TABLET: 15 | 30 days supply | Qty: 30 | Fill #0

## 2016-11-08 ENCOUNTER — Other Ambulatory Visit: Payer: Self-pay | Admitting: *Deleted

## 2016-11-08 ENCOUNTER — Ambulatory Visit (INDEPENDENT_AMBULATORY_CARE_PROVIDER_SITE_OTHER): Payer: 59 | Admitting: Sports Medicine

## 2016-11-08 ENCOUNTER — Telehealth: Payer: Self-pay | Admitting: Internal Medicine

## 2016-11-08 DIAGNOSIS — M5416 Radiculopathy, lumbar region: Secondary | ICD-10-CM

## 2016-11-08 MED ORDER — PREDNISONE 50 MG PO TABS: ORAL_TABLET | ORAL | 0 refills | Status: DC

## 2016-11-08 MED ORDER — KETOROLAC TROMETHAMINE 30 MG/ML IJ SOLN
30.0000 mg | Freq: Once | INTRAMUSCULAR | Status: AC
  Administered 2016-11-08: 30 mg via INTRAMUSCULAR

## 2016-11-08 MED ORDER — HYDROCODONE-ACETAMINOPHEN 5-325 MG PO TABS: 1.0000 | ORAL_TABLET | Freq: Three times a day (TID) | ORAL | 0 refills | Status: DC | PRN

## 2016-11-08 MED FILL — predniSONE 50 MG TABS: 50 | 5 days supply | Qty: 5 | Fill #0

## 2016-11-08 MED FILL — HYDROCODON-APAP 5-325: 5-325 | 7 days supply | Qty: 20 | Fill #0

## 2016-11-08 NOTE — Progress Notes (Signed)
  Subjective:    CC: Low back pain  HPI: Larry [name] is a pleasant 55 year old male, he is one of our [employer], we have been treating him for axial and occasionally radicular low back pain, we did an MRI sometime ago with L4-5 and L5-S1 protruding discs.  Overall he was doing well until he bent over to pick up something, felt a sharp pain in his right low back, there is no radiation down the leg, but pain was axial, discogenic, worse with sitting, flexion, Valsalva. No bowel or bladder distention, saddle numbness, constitutional symptoms.  Past medical history:  Negative.  See flowsheet/record as well for more information.  Surgical history: Negative.  See flowsheet/record as well for more information.  Family history: Negative.  See flowsheet/record as well for more information.  Social history: Negative.  See flowsheet/record as well for more information.  Allergies, and medications have been entered into the medical record, reviewed, and no changes needed.   Review of Systems: No fevers, chills, night sweats, weight loss, chest pain, or shortness of breath.   Objective:    General: Well Developed, well nourished, and in no acute distress.  Neuro: Alert and oriented x3, extra-ocular muscles intact, sensation grossly intact.  HEENT: Normocephalic, atraumatic, pupils equal round reactive to light, neck supple, no masses, no lymphadenopathy, thyroid nonpalpable.  Skin: Warm and dry, no rashes. Cardiac: Regular rate and rhythm, no murmurs rubs or gallops, no lower extremity edema.  Respiratory: Clear to auscultation bilaterally. Not using accessory muscles, speaking in full sentences. Back Exam:  Inspection: Unremarkable  Motion: Flexion 45 deg, Extension 45 deg, Side Bending to 45 deg bilaterally,  Rotation to 45 deg bilaterally  SLR laying: Negative  XSLR laying: Negative  Palpable tenderness: None. FABER: negative. Sensory change: Gross sensation intact to all lumbar and sacral  dermatomes.  Reflexes: 2+ at both patellar tendons, 2+ at achilles tendons, Babinski's downgoing.  Strength at foot  Plantar-flexion: 5/5 Dorsi-flexion: 5/5 Eversion: 5/5 Inversion: 5/5  Leg strength  Quad: 5/5 Hamstring: 5/5 Hip flexor: 5/5 Hip abductors: 5/5  Gait unremarkable.  Impression and Recommendations:    Right lumbar radiculitis Axial discogenic low back pain, nothing radicular today. He has done well with occasional bursts of prednisone. Bent over and had a new onset pain, nothing resembling cauda equina syndrome. Adding prednisone, his PCP called in Flexeril and meloxicam appropriately, I'm going to add a bit of hydrocodone, he is going on a road trip to [place] and I would like him to have something for emergency pain.

## 2016-11-08 NOTE — Assessment & Plan Note (Signed)
Axial discogenic low back pain, nothing radicular today. He has done well with occasional bursts of prednisone. Bent over and had a new onset pain, nothing resembling cauda equina syndrome. Adding prednisone, his PCP called in Flexeril and meloxicam appropriately, I'm going to add a bit of hydrocodone, he is going on a road trip to [place] and I would like him to have something for emergency pain.

## 2016-11-08 NOTE — Addendum Note (Signed)
Addended by: Elizabeth Sauer on: 11/08/2016 03:20 PM   Modules accepted: Orders

## 2016-11-08 NOTE — Telephone Encounter (Signed)
Communicated with Larry [name] He has a hx of muscle spasms and has been treated with meloxicam previously. He reports no neurologic compromise  I called in meloxicam and flexeril to [place]. He will call his md for appt and/or call me if sxs wrosen

## 2016-11-14 DIAGNOSIS — F419 Anxiety disorder, unspecified: Secondary | ICD-10-CM | POA: Diagnosis not present

## 2016-11-14 DIAGNOSIS — Z79899 Other long term (current) drug therapy: Secondary | ICD-10-CM | POA: Diagnosis not present

## 2016-11-14 DIAGNOSIS — E785 Hyperlipidemia, unspecified: Secondary | ICD-10-CM | POA: Diagnosis not present

## 2016-11-14 DIAGNOSIS — I10 Essential (primary) hypertension: Secondary | ICD-10-CM | POA: Diagnosis not present

## 2016-11-14 MED FILL — HYDROCHLOROTHIAZIDE 25 MG T: 25 | 90 days supply | Qty: 90 | Fill #0

## 2016-11-15 DIAGNOSIS — I10 Essential (primary) hypertension: Secondary | ICD-10-CM | POA: Diagnosis not present

## 2016-11-28 MED FILL — MINOCYCLINE 50 MG CAPSULE: 50 | 30 days supply | Qty: 60 | Fill #5

## 2016-11-28 MED FILL — SIMVASTATIN 20 MG TABLET: 20 | 90 days supply | Qty: 90 | Fill #0

## 2016-12-19 DIAGNOSIS — I10 Essential (primary) hypertension: Secondary | ICD-10-CM | POA: Diagnosis not present

## 2016-12-19 MED FILL — HYDROCHLOROTHIAZIDE 12.5 MG: 12.5 | 90 days supply | Qty: 90 | Fill #0

## 2016-12-19 MED FILL — LISINOPRIL 10 MG TABLET: 10 | 90 days supply | Qty: 90 | Fill #0

## 2017-01-02 MED FILL — MINOCYCLINE 50 MG CAPSULE: 50 | 30 days supply | Qty: 60 | Fill #6

## 2017-02-06 MED FILL — MINOCYCLINE 50 MG CAPSULE: 50 | 30 days supply | Qty: 60 | Fill #0

## 2017-02-09 DIAGNOSIS — E785 Hyperlipidemia, unspecified: Secondary | ICD-10-CM | POA: Diagnosis not present

## 2017-02-09 DIAGNOSIS — Z79899 Other long term (current) drug therapy: Secondary | ICD-10-CM | POA: Diagnosis not present

## 2017-02-09 DIAGNOSIS — I10 Essential (primary) hypertension: Secondary | ICD-10-CM | POA: Diagnosis not present

## 2017-03-12 MED FILL — MINOCYCLINE 50 MG CAPSULE: 50 | 30 days supply | Qty: 60 | Fill #1

## 2017-03-19 MED FILL — SIMVASTATIN 20 MG TABLET: 20 | 90 days supply | Qty: 90 | Fill #1

## 2017-03-19 MED FILL — LISINOPRIL 10 MG TABLET: 10 | 90 days supply | Qty: 90 | Fill #1

## 2017-03-19 MED FILL — HYDROCHLOROTHIAZIDE 12.5 MG: 12.5 | 90 days supply | Qty: 90 | Fill #1

## 2017-03-20 DIAGNOSIS — H6123 Impacted cerumen, bilateral: Secondary | ICD-10-CM | POA: Diagnosis not present

## 2017-04-10 MED FILL — MINOCYCLINE 50 MG CAPSULE: 50 | 30 days supply | Qty: 60 | Fill #2

## 2017-04-12 ENCOUNTER — Ambulatory Visit (INDEPENDENT_AMBULATORY_CARE_PROVIDER_SITE_OTHER): Payer: 59 | Admitting: Sports Medicine

## 2017-04-12 ENCOUNTER — Ambulatory Visit (INDEPENDENT_AMBULATORY_CARE_PROVIDER_SITE_OTHER): Payer: 59

## 2017-04-12 DIAGNOSIS — M7581 Other shoulder lesions, right shoulder: Secondary | ICD-10-CM

## 2017-04-12 DIAGNOSIS — S4991XA Unspecified injury of right shoulder and upper arm, initial encounter: Secondary | ICD-10-CM | POA: Diagnosis not present

## 2017-04-12 DIAGNOSIS — M25511 Pain in right shoulder: Secondary | ICD-10-CM | POA: Diagnosis not present

## 2017-04-12 MED ORDER — MELOXICAM 15 MG PO TABS: ORAL_TABLET | ORAL | 3 refills | Status: DC

## 2017-04-12 MED FILL — MELOXICAM 15 MG TABLET: 15 | 30 days supply | Qty: 30 | Fill #0

## 2017-04-12 NOTE — Assessment & Plan Note (Signed)
Suspect subscapularis tendinopathy. Avoid overhead press, shoulder press, chest press, incline bench press in the gym. Formal physical therapy, x-rays, meloxicam.  Return in 6 weeks, subacromial versus subcoracoid injection if no better.

## 2017-04-12 NOTE — Progress Notes (Signed)
   Subjective:    I'm seeing this patient as a consultation for:  Dr. Morene Crocker  CC: Right shoulder pain  HPI: This is a pleasant 56 year old male, for the past month or so he's had pain that he localizes on the anterolateral aspect of his right shoulder after doing bench press and chest press. Moderate, persistent without radiation, no mechanical symptoms, no trauma.  Past medical history:  Negative.  See flowsheet/record as well for more information.  Surgical history: Negative.  See flowsheet/record as well for more information.  Family history: Negative.  See flowsheet/record as well for more information.  Social history: Negative.  See flowsheet/record as well for more information.  Allergies, and medications have been entered into the medical record, reviewed, and no changes needed.   Review of Systems: No headache, visual changes, nausea, vomiting, diarrhea, constipation, dizziness, abdominal pain, skin rash, fevers, chills, night sweats, weight loss, swollen lymph nodes, body aches, joint swelling, muscle aches, chest pain, shortness of breath, mood changes, visual or auditory hallucinations.   Objective:   General: Well Developed, well nourished, and in no acute distress.  Neuro/Psych: Alert and oriented x3, extra-ocular muscles intact, able to move all 4 extremities, sensation grossly intact. Skin: Warm and dry, no rashes noted.  Respiratory: Not using accessory muscles, speaking in full sentences, trachea midline.  Cardiovascular: Pulses palpable, no extremity edema. Abdomen: Does not appear distended. Right Shoulder: Inspection reveals no abnormalities, atrophy or asymmetry. Palpation is normal with no tenderness over AC joint or bicipital groove. ROM is full in all planes. Rotator cuff strength normal with the exception of a positive lift off test consistent with subscapularis dysfunction Mildly positive Neer and Hawkin's tests, empty can. Speeds and Yergason's tests  normal. No labral pathology noted with negative Obrien's, negative crank, negative clunk, and good stability. Normal scapular function observed. No painful arc and no drop arm sign. No apprehension sign  X-rays personally reviewed and are negative.  Impression and Recommendations:   This case required medical decision making of moderate complexity.  Right rotator cuff tendinitis Suspect subscapularis tendinopathy. Avoid overhead press, shoulder press, chest press, incline bench press in the gym. Formal physical therapy, x-rays, meloxicam.  Return in 6 weeks, subacromial versus subcoracoid injection if no better.

## 2017-04-25 ENCOUNTER — Ambulatory Visit: Payer: 59 | Attending: Sports Medicine

## 2017-04-25 DIAGNOSIS — M25611 Stiffness of right shoulder, not elsewhere classified: Secondary | ICD-10-CM

## 2017-04-25 DIAGNOSIS — M25511 Pain in right shoulder: Secondary | ICD-10-CM

## 2017-04-25 DIAGNOSIS — R293 Abnormal posture: Secondary | ICD-10-CM

## 2017-04-25 DIAGNOSIS — R252 Cramp and spasm: Secondary | ICD-10-CM | POA: Diagnosis present

## 2017-04-25 NOTE — Therapy (Signed)
[facility] [address], [place], [place], [address] Phone: [phone]   Fax:  [phone]  Physical Therapy Evaluation  Patient Details  Name: Larry [name] MRN: [id number] Date of Birth: 1961-03-14 Referring Provider: Aundria Mems, MD  Encounter Date: 04/25/2017      PT End of Session - 04/25/17 1316    Visit Number 1   Date for PT Re-Evaluation 06/20/17   PT Start Time 1324   PT Stop Time 1316   PT Time Calculation (min) 43 min   Activity Tolerance Patient tolerated treatment well   Behavior During Therapy [facility] for tasks assessed/performed      Past Medical History:  Diagnosis Date  . Hyperlipidemia     Past Surgical History:  Procedure Laterality Date  . APPENDECTOMY      There were no vitals filed for this visit.       Subjective Assessment - 04/25/17 1236    Subjective Pt is a Lt hand dominant male who presents to PT with complaints of Rt shoulder pain that began ~1 month ago.  Pt reports that this pain has been intermittent over the past.   No incident or injury.  MD has advised pt to avoid chest, shoulder and incline press at the gym.     Pertinent History MD wants pt to avoid: overhead shoulder press, chest and incline press at the gym.    How long can you sit comfortably? 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 Yes   Pain Score 0-No pain  5/10 with lifting weights   Pain Location Shoulder   Pain Orientation Right   Pain Descriptors / Indicators Aching   Pain Type Acute pain   Pain Onset More than a month ago   Pain Frequency Intermittent   Aggravating Factors  strenuous activity, lifting at the gym   Pain Relieving Factors not performing the aggravating activity            [facility] PT Assessment - 04/25/17 0001      Assessment   Medical Diagnosis Rt rotator  cuff tendinitis   Referring Provider Aundria Mems, MD   Onset Date/Surgical Date 03/28/17   Hand Dominance Left   Next MD Visit 05/24/17     Precautions   Precautions Other (comment)   Precaution Comments avoid overhead, chest and incline press with weights     Restrictions   Weight Bearing Restrictions No     Balance Screen   Has the patient fallen in the past 6 months No   Has the patient had a decrease in activity level because of a fear of falling?  No   Is the patient reluctant to leave their home because of a fear of falling?  No     Home Ec[employer] residence     Prior Function   Level of Independence Independent   Vocation Full time employment   Vocation Requirements desk work   Leisure gym exercises, travel, reading     Cognition   Overall Cognitive Status Within Functional Limits for tasks assessed     Observation/Other Assessments   Focus on Therapeutic Outcomes (FOTO)  18% limitation     Posture/Postural Control   Posture/Postural Control Postural limitations   Postural Limitations Forward head;Rounded Shoulders     ROM / Strength   AROM /  PROM / Strength AROM;PROM;Strength     AROM   Overall AROM  Deficits   Overall AROM Comments bil. shoulder flexibility limited by 25% into flexion and abduction without pain     PROM   Overall PROM  Deficits   Overall PROM Comments flexion, abduction and IR limited by 10% bilaterally without pain     Strength   Overall Strength Within functional limits for tasks performed     Palpation   Palpation comment pt with trigger points and tension in Rt upper trap and levator.  Tenderness over posterior aspect of Rt glenohumeral joint and tension at medial aspect of scapular at subscapularis     Ambulation/Gait   Gait Pattern Within Functional Limits            Objective measurements completed on examination: See above findings.          [facility] Adult PT Treatment/Exercise  - 04/25/17 0001      Modalities   Modalities Iontophoresis     Iontophoresis   Type of Iontophoresis Dexamethasone   Location Rt posterior shoulder   Dose 1.0 cc  #1   Time 6 hour wear time                PT Education - 04/25/17 1315    Education provided Yes   Education Details shoulder flexion, upper trap stretch, ER and horizontal abduction   Person(s) Educated Patient   Methods Explanation;Demonstration;Handout   Comprehension Verbalized understanding;Returned demonstration          PT Short Term Goals - 04/25/17 1407      PT SHORT TERM GOAL #1   Title be independent in initial HEP   Time 4   Period Weeks   Status New     PT SHORT TERM GOAL #2   Title report a 25% reduction in Rt shoulder pain with lifting   Time 4   Period Weeks   Status New     PT SHORT TERM GOAL #3   Title verbalize and demonstrate correct posture and report corrections at work and home   Time 4   Period Weeks   Status New           PT Long Term Goals - 04/25/17 1407      PT LONG TERM GOAL #1   Title be independent in advanced HEP   Time 8   Period Weeks   Status New     PT LONG TERM GOAL #2   Title return to lifting weights at the gym as allowed by MD and report < or = to 1/10 Rt shoulder pain   Time 8   Period Weeks   Status New     PT LONG TERM GOAL #3   Title report a 75% reduction in Rt shoulder pain with lifting   Time 8   Period Weeks   Status New                Plan - 04/25/17 1356    Clinical Impression Statement Pt is a Lt hand dominant male who presents to PT with complaints of a 1 month history of Rt shoulder pain.  Pt reports an intermittent history of Rt shoulder pain and most recent flare-up was 1 month ago.  Recent x-ray was negative.  Pt reports up to 5/10 Rt shoulder pain when working out with weights at the gym.  MD has told pt to stop doing chest press, incline and overhead press at this  time.  Pt demonstrates forward shoulder posture  and forward head, decreased bilateral shoulder A/ROM due to stiffness and decreased joint mobility and palpalbe tenderness over the posterior aspect of the glenohumeral joint.  Pt with trigger points in Rt upper trap and levator muscles.  Pt will benefit from skilled PT for postural education and strength, flexibility, manual and dry needling to reduce pain and allow for return to lifting weights without pain.     History and Personal Factors relevant to plan of care: none   Clinical Presentation Stable   Clinical Decision Making Low   Rehab Potential Good   PT Frequency 2x / week   PT Duration 8 weeks   PT Treatment/Interventions ADLs/Self Care Home Management;Electrical Stimulation;Cryotherapy;Iontophoresis 4mg /ml Dexamethasone;Ultrasound;Moist Heat;Traction;Therapeutic activities;Therapeutic exercise;Neuromuscular re-education;Patient/family education;Passive range of motion;Manual techniques;Dry needling;Taping   PT Next Visit Plan dry needling to Rt upper trap and levator, shoulder flexibility, ionto #2, scapular/postural strength   Consulted and Agree with Plan of Care Patient      Patient will benefit from skilled therapeutic intervention in order to improve the following deficits and impairments:  Postural dysfunction, Impaired flexibility, Pain, Decreased activity tolerance, Increased muscle spasms, Impaired UE functional use, Decreased range of motion  Visit Diagnosis: Acute pain of right shoulder - Plan: PT plan of care cert/re-cert  Abnormal posture - Plan: PT plan of care cert/re-cert  Cramp and spasm - Plan: PT plan of care cert/re-cert  Stiffness of right shoulder, not elsewhere classified - Plan: PT plan of care cert/re-cert     Problem List Patient Active Problem List   Diagnosis Date Noted  . Right rotator cuff tendinitis 04/12/2017  . Right lumbar radiculitis 09/29/2014  . Foot pain, left 09/08/2014  . HYPERLIPIDEMIA 10/09/2010     [facility] [address] [place], [place], [address] Phone: [phone]   Fax:  [phone]  Name: Larry [name] MRN: [id number] Date of Birth: 11-02-1961

## 2017-04-25 NOTE — Patient Instructions (Signed)
AROM: Lateral Neck Flexion    Slowly tilt head toward one shoulder, then the other. Hold each position _20___ seconds. Repeat _3___ times per set. Do __3__ sets per session. Do ____ sessions per day.  http://orth.exer.us/296   Copyright  VHI. All rights reserved.  Hold all stretches 5-10 seconds and perform 5-10 times, 3 times a day.   Slide right arm up wall, with  PALM FACING THE WALL, by leaning toward wall.     Resisted Horizontal Abduction: Bilateral   Sit or stand, tubing in both hands, arms out in front. Keeping arms straight, pinch shoulder blades together and stretch arms out. Repeat _10___ times per set. Do 2____ sets per session. Do _1-2___ sessions per day.        Scapular Retraction: Elbow Flexion (Standing)   With elbows bent to 90, pinch shoulder blades together and rotate arms out while holding the green band. keeping elbows bent. Repeat _2x10___ times per set. Do _1___ sets per session. Do 1-2____ sessions per day.    Posture - Standing   Good posture is important. Avoid slouching and forward head thrust. Maintain curve in low back and align ears over shoulders, hips over ankles.  Pull your belly button in toward your back bone. Posture Tips DO: - stand tall and erect - keep chin tucked in - keep head and shoulders in alignment - check posture regularly in mirror or large window - pull head back against headrest in car seat;  Change your position often.  Sit with lumbar support. DON'T: - slouch or slump while watching TV or reading - sit, stand or lie in one position  for too long;  Sitting is especially hard on the spine so if you sit at a desk/use the computer, then stand up often! Copyright  VHI. All rights reserved.  Posture - Sitting  Sit upright, head facing forward. Try using a roll to support lower back. Keep shoulders relaxed, and avoid rounded back. Keep hips level with knees. Avoid crossing legs for long periods. Copyright  VHI. All  rights reserved.  Chronic neck strain can develop because of poor posture and faulty work habits  Postural strain related to slumped sitting and forward head posture is a leading cause of headaches, neck and upper back pain  General strengthening and flexibility exercises are helpful in the treatment of neck pain.  Most importantly, you should learn to correct the posture that may be contributing to chronic pain.   Change positions frequently  Change your work or home environment to improve posture and mechanics.   IONTOPHORESIS PATIENT PRECAUTIONS & CONTRAINDICATIONS:  . Redness under one or both electrodes can occur.  This characterized by a uniform redness that usually disappears within 12 hours of treatment. . Small pinhead size blisters may result in response to the drug.  Contact your physician if the problem persists more than 24 hours. . On rare occasions, iontophoresis therapy can result in temporary skin reactions such as rash, inflammation, irritation or burns.  The skin reactions may be the result of individual sensitivity to the ionic solution used, the condition of the skin at the start of treatment, reaction to the materials in the electrodes, allergies or sensitivity to dexamethasone, or a poor connection between the patch and your skin.  Discontinue using iontophoresis if you have any of these reactions and report to your therapist. . Remove the Patch or electrodes if you have any undue sensation of pain or burning during the treatment and report discomfort  to your therapist. . Tell your Therapist if you have had known adverse reactions to the application of electrical current. . If using the Patch, the LED light will turn off when treatment is complete and the patch can be removed.  Approximate treatment time is 1-3 hours.  Remove the patch when light goes off or after 6 hours. . The Patch can be worn during normal activity, however excessive motion where the electrodes have  been placed can cause poor contact between the skin and the electrode or uneven electrical current resulting in greater risk of skin irritation. Marland Kitchen Keep out of the reach of children.   . DO NOT use if you have a cardiac pacemaker or any other electrically sensitive implanted device. . DO NOT use if you have a known sensitivity to dexamethasone. . DO NOT use during Magnetic Resonance Imaging (MRI). . DO NOT use over broken or compromised skin (e.g. sunburn, cuts, or acne) due to the increased risk of skin reaction. . DO NOT SHAVE over the area to be treated:  To establish good contact between the Patch and the skin, excessive hair may be clipped. . DO NOT place the Patch or electrodes on or over your eyes, directly over your heart, or brain. . DO NOT reuse the Patch or electrodes as this may cause burns to occur.  [facility] [address] [place], [place] [address] Phone # [phone] Fax [phone]

## 2017-05-03 ENCOUNTER — Encounter: Payer: Self-pay | Admitting: Physical Therapy

## 2017-05-03 ENCOUNTER — Ambulatory Visit: Payer: 59 | Attending: Sports Medicine | Admitting: Physical Therapy

## 2017-05-03 DIAGNOSIS — R293 Abnormal posture: Secondary | ICD-10-CM | POA: Insufficient documentation

## 2017-05-03 DIAGNOSIS — M25511 Pain in right shoulder: Secondary | ICD-10-CM | POA: Diagnosis present

## 2017-05-03 DIAGNOSIS — R252 Cramp and spasm: Secondary | ICD-10-CM | POA: Insufficient documentation

## 2017-05-03 DIAGNOSIS — M25611 Stiffness of right shoulder, not elsewhere classified: Secondary | ICD-10-CM | POA: Insufficient documentation

## 2017-05-03 NOTE — Patient Instructions (Signed)
Trapezius Squeeze: Easy Positions    1.Middle: Head on ball, elbow bent, arm resting on ball, thumb UP, squeeze shoulder blade in, lift arm, rounding ball. 2.Lower: Head on ball, arm over head, resting on ball, thumb UP, squeeze shoulder blade in, lift arm, rounding ball. Hold _5__ seconds. Repeat _5__ times. Lift right arm. Do _2__ sessions per day.  Copyright  VHI. All rights reserved.   [facility] [address] [place], [place] [address] Phone # [phone] Fax [phone]

## 2017-05-03 NOTE — Therapy (Signed)
[facility] [address] [place], [place], [address] Phone: [phone]   Fax:  [phone]  Physical Therapy Treatment  Patient Details  Name: ZYRION COEY MRN: [id number] Date of Birth: 1961/03/08 Referring Provider: Aundria Mems, MD  Encounter Date: 05/03/2017      PT End of Session - 05/03/17 0925    Visit Number 2   Date for PT Re-Evaluation 06/20/17   PT Start Time 0800   PT Stop Time 0845   PT Time Calculation (min) 45 min   Activity Tolerance Patient tolerated treatment well   Behavior During Therapy [facility] for tasks assessed/performed      Past Medical History:  Diagnosis Date  . Hyperlipidemia     Past Surgical History:  Procedure Laterality Date  . APPENDECTOMY      There were no vitals filed for this visit.      Subjective Assessment - 05/03/17 0805    Subjective I am still going to the gym with cardio and lifting. My pain is fine today.  I am terrified with needles. The ionto patch helped.    Pertinent History MD wants pt to avoid: overhead shoulder press, chest and incline press at the gym.    How long can you sit comfortably? 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 No/denies                         [facility] Adult PT Treatment/Exercise - 05/03/17 0001      Exercises   Exercises Shoulder     Shoulder Exercises: Prone   Other Prone Exercises hand over head with lifting elbow to depress shoulder     Shoulder Exercises: ROM/Strengthening   Rebounder Level 0 4 min forward     Manual Therapy   Manual Therapy Soft tissue mobilization;Joint mobilization   Manual therapy comments PROM to bil. cervical rotation and sidebending; isometric contraction of the suboccipitals to elongate the muscles   Joint Mobilization P_A mobilization to T1-T4 grade 3;  Sideglide mobilization to C3-C7 bil. sides   Soft tissue mobilization right upper trap, levator scapula, rhomboide, thoracic paraspinals, supraspinatus, bil. cervical paraspinals, bil. SCM, bil. suboccipitals, bil. scalenes                PT Education - 05/03/17 0925    Education provided Yes   Education Details lower trap strengthening; posture education   Person(s) Educated Patient   Methods Explanation;Verbal cues;Handout   Comprehension Verbalized understanding;Returned demonstration          PT Short Term Goals - 05/03/17 7371      PT SHORT TERM GOAL #1   Title be independent in initial HEP   Period Weeks   Status On-going     PT SHORT TERM GOAL #3   Title verbalize and demonstrate correct posture and report corrections at work and home   Time 4   Period Weeks   Status Achieved           PT Long Term Goals - 04/25/17 1407      PT LONG TERM GOAL #1   Title be independent in advanced HEP   Time 8   Period Weeks   Status New     PT LONG TERM GOAL #2   Title return to lifting weights at the gym as allowed  by MD and report < or = to 1/10 Rt shoulder pain   Time 8   Period Weeks   Status New     PT LONG TERM GOAL #3   Title report a 75% reduction in Rt shoulder pain with lifting   Time 8   Period Weeks   Status New               Plan - 05/03/17 0802    Clinical Impression Statement Patient had full cervical ROM bil. after therapy.  Patient had difficulty with scapula retraction and depression without contracting the upper trap and levato.  Patient did not have the ionto today due to focusing on the soft tissue work.  Patient will benefit from skilled therapy to reduce pain and allow for return to lifing weights without pain,    Rehab Potential Good   PT Frequency 2x / week   PT Duration 8 weeks   PT Treatment/Interventions ADLs/Self Care Home Management;Electrical Stimulation;Cryotherapy;Iontophoresis 4mg /ml Dexamethasone;Ultrasound;Moist  Heat;Traction;Therapeutic activities;Therapeutic exercise;Neuromuscular re-education;Patient/family education;Passive range of motion;Manual techniques;Dry needling;Taping   PT Next Visit Plan dry needling to Rt upper trap and levator, shoulder flexibility, ionto #2, scapular/postural strength   Consulted and Agree with Plan of Care Patient      Patient will benefit from skilled therapeutic intervention in order to improve the following deficits and impairments:  Postural dysfunction, Impaired flexibility, Pain, Decreased activity tolerance, Increased muscle spasms, Impaired UE functional use, Decreased range of motion  Visit Diagnosis: Acute pain of right shoulder  Abnormal posture  Cramp and spasm  Stiffness of right shoulder, not elsewhere classified     Problem List Patient Active Problem List   Diagnosis Date Noted  . Right rotator cuff tendinitis 04/12/2017  . Right lumbar radiculitis 09/29/2014  . Foot pain, left 09/08/2014  . HYPERLIPIDEMIA 10/09/2010    Earlie Counts, PT 05/03/17 9:30 AM   [facility] [address] [place], [place], [address] Phone: [phone]   Fax:  [phone]  Name: GERMANY CHELF MRN: [id number] Date of Birth: Oct 30, 1961

## 2017-05-09 ENCOUNTER — Ambulatory Visit: Payer: 59

## 2017-05-09 DIAGNOSIS — M25611 Stiffness of right shoulder, not elsewhere classified: Secondary | ICD-10-CM | POA: Diagnosis not present

## 2017-05-09 DIAGNOSIS — R252 Cramp and spasm: Secondary | ICD-10-CM | POA: Diagnosis not present

## 2017-05-09 DIAGNOSIS — R293 Abnormal posture: Secondary | ICD-10-CM | POA: Diagnosis not present

## 2017-05-09 DIAGNOSIS — M25511 Pain in right shoulder: Secondary | ICD-10-CM

## 2017-05-09 NOTE — Therapy (Signed)
[facility] [address] [place], [place], [address] Phone: [phone]   Fax:  [phone]  Physical Therapy Treatment  Patient Details  Name: Larry [name] MRN: [id number] Date of Birth: 03-03-1961 Referring Provider: Aundria Mems, MD  Encounter Date: 05/09/2017      PT End of Session - 05/09/17 0926    Visit Number 3   Date for PT Re-Evaluation 06/20/17   PT Start Time 0846   PT Stop Time 0927   PT Time Calculation (min) 41 min   Activity Tolerance Patient tolerated treatment well   Behavior During Therapy [facility] for tasks assessed/performed      Past Medical History:  Diagnosis Date  . Hyperlipidemia     Past Surgical History:  Procedure Laterality Date  . APPENDECTOMY      There were no vitals filed for this visit.      Subjective Assessment - 05/09/17 0848    Subjective I was able to exercise at the gym without pain.     Currently in Pain? No/denies                         [facility] Adult PT Treatment/Exercise - 05/09/17 0001      Shoulder Exercises: Seated   Horizontal ABduction Strengthening;Both;20 reps;Theraband   Theraband Level (Shoulder Horizontal ABduction) Level 4 (Blue)   External Rotation Strengthening;Both;20 reps;Theraband   Theraband Level (Shoulder External Rotation) Level 4 (Blue)   Other Seated Exercises D2 with blue band: 2x10     Iontophoresis   Type of Iontophoresis Dexamethasone   Location Rt posterior shoulder   Dose 1.0 cc  #2   Time 6 hour wear time     Manual Therapy   Manual Therapy Soft tissue mobilization;Joint mobilization   Manual therapy comments PROM to bil. cervical rotation and sidebending; isometric contraction of the suboccipitals to elongate the muscles   Joint Mobilization P_A mobilization to T1-T4 grade 3; Sideglide mobilization to C3-C7 bil. sides   Soft tissue mobilization right upper trap, levator scapula, rhomboide,  thoracic paraspinals, supraspinatus, bil. cervical paraspinals, bil. SCM, bil. suboccipitals, bil. scalenes                PT Education - 05/09/17 0926    Education provided Yes   Education Details ionto    Person(s) Educated Patient   Methods Explanation;Handout   Comprehension Verbalized understanding          PT Short Term Goals - 05/09/17 0852      PT SHORT TERM GOAL #1   Title be independent in initial HEP   Status Achieved     PT [place] #2   Title report a 25% reduction in Rt shoulder pain with lifting   Status Achieved     PT SHORT TERM GOAL #3   Title verbalize and demonstrate correct posture and report corrections at work and home   Status Achieved           PT Long Term Goals - 04/25/17 1407      PT LONG TERM GOAL #1   Title be independent in advanced HEP   Time 8   Period Weeks   Status New     PT LONG TERM GOAL #2   Title return to lifting weights at the gym as allowed by MD and report < or = to 1/10 Rt shoulder pain   Time 8   Period Weeks   Status  New     PT LONG TERM GOAL #3   Title report a 75% reduction in Rt shoulder pain with lifting   Time 8   Period Weeks   Status New               Plan - 05/09/17 8341    Clinical Impression Statement Pt with improved postural awareness and is making corrections at home.  Pt with Lt scapular weakness vs the Rt.  Pt reports that he has been able to lift weights at the gym without increased pain over the past week.  Pt with continued tension and trigger points in traps, thoracic spine and cervical spine.  Pt will continue to benefit from skilled PT for manual therapy, ionto and postural strength.     Rehab Potential Good   PT Frequency 2x / week   PT Duration 8 weeks   PT Treatment/Interventions ADLs/Self Care Home Management;Electrical Stimulation;Cryotherapy;Iontophoresis 4mg /ml Dexamethasone;Ultrasound;Moist Heat;Traction;Therapeutic activities;Therapeutic  exercise;Neuromuscular re-education;Patient/family education;Passive range of motion;Manual techniques;Dry needling;Taping   PT Next Visit Plan  shoulder flexibility, ionto #3, scapular/postural strength, manual to cervical and thoracic spine   Consulted and Agree with Plan of Care Patient      Patient will benefit from skilled therapeutic intervention in order to improve the following deficits and impairments:  Postural dysfunction, Impaired flexibility, Pain, Decreased activity tolerance, Increased muscle spasms, Impaired UE functional use, Decreased range of motion  Visit Diagnosis: Acute pain of right shoulder  Abnormal posture  Cramp and spasm  Stiffness of right shoulder, not elsewhere classified     Problem List Patient Active Problem List   Diagnosis Date Noted  . Right rotator cuff tendinitis 04/12/2017  . Right lumbar radiculitis 09/29/2014  . Foot pain, left 09/08/2014  . HYPERLIPIDEMIA 10/09/2010     [name], PT 05/09/17 9:28 AM  [facility] [address] [place], [place], [address] Phone: [phone]   Fax:  [phone]  Name: Larry [name] MRN: [id number] Date of Birth: 1961/01/05

## 2017-05-09 NOTE — Patient Instructions (Signed)

## 2017-05-10 MED FILL — MINOCYCLINE 50 MG CAPSULE: 50 | 30 days supply | Qty: 60 | Fill #0

## 2017-05-11 MED FILL — ALPRAZolam 0.5 MG TABS: 0.5 | 7 days supply | Qty: 30 | Fill #0

## 2017-05-17 ENCOUNTER — Ambulatory Visit: Payer: 59

## 2017-05-17 DIAGNOSIS — M25511 Pain in right shoulder: Secondary | ICD-10-CM

## 2017-05-17 DIAGNOSIS — R252 Cramp and spasm: Secondary | ICD-10-CM

## 2017-05-17 DIAGNOSIS — R293 Abnormal posture: Secondary | ICD-10-CM

## 2017-05-17 DIAGNOSIS — M25611 Stiffness of right shoulder, not elsewhere classified: Secondary | ICD-10-CM

## 2017-05-17 NOTE — Therapy (Signed)
[facility] [address] [place], [place], [address] Phone: [phone]   Fax:  [phone]  Physical Therapy Treatment  Patient Details  Name: Larry [name] MRN: [id number] Date of Birth: [birth year] Referring Provider: Aundria Mems, MD  Encounter Date: 05/17/2017      PT End of Session - 05/17/17 0847    Visit Number 4   Date for PT Re-Evaluation 06/20/17   PT Start Time 0805   PT Stop Time 0900   PT Time Calculation (min) 55 min   Activity Tolerance Patient tolerated treatment well   Behavior During Therapy [facility] for tasks assessed/performed      Past Medical History:  Diagnosis Date  . Hyperlipidemia     Past Surgical History:  Procedure Laterality Date  . APPENDECTOMY      There were no vitals filed for this visit.      Subjective Assessment - 05/17/17 0807    Subjective Doing all normal exercises at the gym except for pull downs.   MD told him not to do it.     Currently in Pain? No/denies   Aggravating Factors  some sensitivity at end range exercise                         [facility] Adult PT Treatment/Exercise - 05/17/17 0001      Shoulder Exercises: Seated   Horizontal ABduction Strengthening;Both;20 reps;Theraband   Theraband Level (Shoulder Horizontal ABduction) Level 4 (Blue)   External Rotation Strengthening;Both;20 reps;Theraband  verbal and tactile cues for scapular activation and posture   Theraband Level (Shoulder External Rotation) Level 4 (Blue)   Other Seated Exercises D2 with blue band: 2x10     Modalities   Modalities Moist Heat     Moist Heat Therapy   Number Minutes Moist Heat 12 Minutes   Moist Heat Location Cervical  thoracic     Iontophoresis   Type of Iontophoresis Dexamethasone   Location Rt posterior shoulder   Dose 1.0 cc  #3   Time 6 hour wear time     Manual Therapy   Manual Therapy Soft tissue mobilization;Joint mobilization    Manual therapy comments PROM to bil. cervical rotation and sidebending; isometric contraction of the suboccipitals to elongate the muscles   Joint Mobilization P_A mobilization to T1-T4 grade 3; Sideglide mobilization to C3-C7 bil. sides   Soft tissue mobilization right upper trap, levator scapula, rhomboide, thoracic paraspinals, supraspinatus, bil. cervical paraspinals, bil. SCM, bil. suboccipitals, bil. scalenes                  PT Short Term Goals - 05/09/17 9379      PT SHORT TERM GOAL #1   Title be independent in initial HEP   Status Achieved     PT SHORT TERM GOAL #2   Title report a 25% reduction in Rt shoulder pain with lifting   Status Achieved     PT SHORT TERM GOAL #3   Title verbalize and demonstrate correct posture and report corrections at work and home   Status Achieved           PT Long Term Goals - 05/17/17 0240      PT LONG TERM GOAL #1   Title be independent in advanced HEP   Time 8   Period Weeks   Status On-going     PT LONG TERM GOAL #2   Title return to lifting weights at the  gym as allowed by MD and report < or = to 1/10 Rt shoulder pain   Time 8   Period Weeks   Status On-going     PT LONG TERM GOAL #3   Title report a 75% reduction in Rt shoulder pain with lifting   Time 8   Period Weeks   Status On-going               Plan - 05/17/17 0819    Clinical Impression Statement Pt with improved postural awareness at home and work and reports corrections at home.  Pt with Lt scapular weakness vs the Rt and notices this with gym exercises.  Pt has been lifting weights at the gym and has not been doing pull downs per MD orders.  Pt with continued trigger points and tension in the traps, thoracic and cervical spine.  Pt will continue to benefit from skilled PT for manual, ionto and postural strength.     Rehab Potential Good   PT Frequency 2x / week   PT Duration 8 weeks   PT Treatment/Interventions ADLs/Self Care Home  Management;Electrical Stimulation;Cryotherapy;Iontophoresis 4mg /ml Dexamethasone;Ultrasound;Moist Heat;Traction;Therapeutic activities;Therapeutic exercise;Neuromuscular re-education;Patient/family education;Passive range of motion;Manual techniques;Dry needling;Taping   PT Next Visit Plan ionto #4, FOTO and MD note for appointment on 05/24/17.  Continue manual therapy.      Patient will benefit from skilled therapeutic intervention in order to improve the following deficits and impairments:  Postural dysfunction, Impaired flexibility, Pain, Decreased activity tolerance, Increased muscle spasms, Impaired UE functional use, Decreased range of motion  Visit Diagnosis: Acute pain of right shoulder  Abnormal posture  Cramp and spasm  Stiffness of right shoulder, not elsewhere classified     Problem List Patient Active Problem List   Diagnosis Date Noted  . Right rotator cuff tendinitis 04/12/2017  . Right lumbar radiculitis 09/29/2014  . Foot pain, left 09/08/2014  . HYPERLIPIDEMIA 10/09/2010    [name], PT 05/17/17 8:51 AM  [facility] [address], [place], [place], [address] Phone: [phone]   Fax:  [phone]  Name: Larry [name] MRN: [id number] Date of Birth: March 02, 1961

## 2017-05-23 ENCOUNTER — Ambulatory Visit: Payer: 59

## 2017-05-23 DIAGNOSIS — M25511 Pain in right shoulder: Secondary | ICD-10-CM | POA: Diagnosis not present

## 2017-05-23 DIAGNOSIS — M25611 Stiffness of right shoulder, not elsewhere classified: Secondary | ICD-10-CM | POA: Diagnosis not present

## 2017-05-23 DIAGNOSIS — R293 Abnormal posture: Secondary | ICD-10-CM

## 2017-05-23 DIAGNOSIS — R252 Cramp and spasm: Secondary | ICD-10-CM

## 2017-05-23 NOTE — Therapy (Addendum)
[facility] [address] [place], [place], [address] Phone: [phone]   Fax:  [phone]  Physical Therapy Treatment  Patient Details  Name: Larry [name] MRN: [id number] Date of Birth: August 16, 1961 Referring Provider: Aundria Mems, MD  Encounter Date: 05/23/2017      PT End of Session - 05/23/17 0845    Visit Number 5   Date for PT Re-Evaluation 06/20/17   PT Start Time 0801   PT Stop Time 0858   PT Time Calculation (min) 57 min   Activity Tolerance Patient tolerated treatment well   Behavior During Therapy [facility] for tasks assessed/performed      Past Medical History:  Diagnosis Date  . Hyperlipidemia     Past Surgical History:  Procedure Laterality Date  . APPENDECTOMY      There were no vitals filed for this visit.          [facility] PT Assessment - 05/23/17 0001      Assessment   Medical Diagnosis Rt rotator cuff tendinitis     Prior Function   Level of Independence Independent     Cognition   Overall Cognitive Status Within Functional Limits for tasks assessed     Observation/Other Assessments   Focus on Therapeutic Outcomes (FOTO)  13% limitation     PROM   Overall PROM  Within functional limits for tasks performed                     [facility] Adult PT Treatment/Exercise - 05/23/17 0001      Shoulder Exercises: Seated   Horizontal ABduction Strengthening;Both;20 reps;Theraband   Theraband Level (Shoulder Horizontal ABduction) Level 4 (Blue)   External Rotation Strengthening;Both;20 reps;Theraband  verbal and tactile cues for scapular activation and posture   Theraband Level (Shoulder External Rotation) Level 4 (Blue)   Other Seated Exercises D2 with blue band: 2x10     Modalities   Modalities Moist Heat     Moist Heat Therapy   Number Minutes Moist Heat 12 Minutes   Moist Heat Location Cervical  thoracic     Iontophoresis   Type of Iontophoresis  Dexamethasone   Location Rt posterior shoulder   Dose 1.0 cc  #4   Time 6 hour wear time     Manual Therapy   Manual Therapy Soft tissue mobilization;Joint mobilization   Manual therapy comments PROM to bil. cervical rotation and sidebending; isometric contraction of the suboccipitals to elongate the muscles   Joint Mobilization P_A mobilization to T1-T4 grade 3; Sideglide mobilization to C3-C7 bil. sides   Soft tissue mobilization right upper trap, levator scapula, rhomboids, thoracic paraspinals, supraspinatus, bil. cervical paraspinals, bil. SCM, bil. suboccipitals, bil. scalenes                  PT Short Term Goals - 05/09/17 1443      PT SHORT TERM GOAL #1   Title be independent in initial HEP   Status Achieved     PT SHORT TERM GOAL #2   Title report a 25% reduction in Rt shoulder pain with lifting   Status Achieved     PT SHORT TERM GOAL #3   Title verbalize and demonstrate correct posture and report corrections at work and home   Status Achieved           PT Long Term Goals - 05/23/17 0803      PT LONG TERM GOAL #1   Title be independent in  advanced HEP   Time 8   Period Weeks   Status On-going     PT LONG TERM GOAL #2   Title return to lifting weights at the gym as allowed by MD and report < or = to 1/10 Rt shoulder pain   Baseline 2/10 with weights.  Not lifting as much weight now.     Time 8   Period Weeks   Status On-going     PT LONG TERM GOAL #3   Title report a 75% reduction in Rt shoulder pain with lifting   Baseline 90% improvement   Status Achieved               Plan - 05/23/17 0808    Clinical Impression Statement Pt reports 90% overall improvement in symptoms since the start of care.  Pt is lifting weights with modificaitons and reports 2/10 Rt shoulder pain with this activity.  Pt is independent in postural corrections and compliance with postural strength exercises and Rt shoulder flexibility.  Pt will see MD tomorrow.      Rehab Potential Good   PT Frequency 2x / week   PT Duration 8 weeks   PT Treatment/Interventions ADLs/Self Care Home Management;Electrical Stimulation;Cryotherapy;Iontophoresis 71m/ml Dexamethasone;Ultrasound;Moist Heat;Traction;Therapeutic activities;Therapeutic exercise;Neuromuscular re-education;Patient/family education;Passive range of motion;Manual techniques;Dry needling;Taping   PT Next Visit Plan See what MD says.  D/C if MD is OK.     Consulted and Agree with Plan of Care Patient      Patient will benefit from skilled therapeutic intervention in order to improve the following deficits and impairments:  Postural dysfunction, Impaired flexibility, Pain, Decreased activity tolerance, Increased muscle spasms, Impaired UE functional use, Decreased range of motion  Visit Diagnosis: Acute pain of right shoulder  Abnormal posture  Cramp and spasm  Stiffness of right shoulder, not elsewhere classified     Problem List Patient Active Problem List   Diagnosis Date Noted  . Right rotator cuff tendinitis 04/12/2017  . Right lumbar radiculitis 09/29/2014  . Foot pain, left 09/08/2014  . HYPERLIPIDEMIA 10/09/2010     KSigurd Sos PT 05/23/17 8:47 AM PHYSICAL THERAPY DISCHARGE SUMMARY  Visits from Start of Care: 5  Current functional level related to goals / functional outcomes: See above for current status.  Pt will return to gym exercises.     Remaining deficits: See above for current status.     Education / Equipment: HEP Plan: Patient agrees to discharge.  Patient goals were partially met. Patient is being discharged due to being pleased with the current functional level.  ?????        KSigurd Sos PT 06/07/17 9:14 AM   [facility] [address] [place] [address]Phone: [phone]  Fax:  [phone] Name: Larry WILLIARDMRN: [id number] of Birth: 103-03-1961

## 2017-05-24 ENCOUNTER — Ambulatory Visit (INDEPENDENT_AMBULATORY_CARE_PROVIDER_SITE_OTHER): Payer: 59 | Admitting: Sports Medicine

## 2017-05-24 DIAGNOSIS — M7581 Other shoulder lesions, right shoulder: Secondary | ICD-10-CM | POA: Diagnosis not present

## 2017-05-24 NOTE — Progress Notes (Signed)
  Subjective:    CC: Follow-up  HPI: Larry [name] is a pleasant 56 year old male, he had a subscapularis strain on the right, this was from doing a bench press. He is improved considerably with activity modification and physical therapy, and essentially pain-free now. Happy with how things are going.  Past medical history:  Negative.  See flowsheet/record as well for more information.  Surgical history: Negative.  See flowsheet/record as well for more information.  Family history: Negative.  See flowsheet/record as well for more information.  Social history: Negative.  See flowsheet/record as well for more information.  Allergies, and medications have been entered into the medical record, reviewed, and no changes needed.   Review of Systems: No fevers, chills, night sweats, weight loss, chest pain, or shortness of breath.   Objective:    General: Well Developed, well nourished, and in no acute distress.  Neuro: Alert and oriented x3, extra-ocular muscles intact, sensation grossly intact.  HEENT: Normocephalic, atraumatic, pupils equal round reactive to light, neck supple, no masses, no lymphadenopathy, thyroid nonpalpable.  Skin: Warm and dry, no rashes. Cardiac: Regular rate and rhythm, no murmurs rubs or gallops, no lower extremity edema.  Respiratory: Clear to auscultation bilaterally. Not using accessory muscles, speaking in full sentences. Right Shoulder: Inspection reveals no abnormalities, atrophy or asymmetry. Palpation is normal with no tenderness over AC joint or bicipital groove. ROM is full in all planes. Rotator cuff strength normal throughout, still has a bit of pain with belly press but significantly stronger. No signs of impingement with negative Neer and Hawkin's tests, empty can. Speeds and Yergason's tests normal. No labral pathology noted with negative Obrien's, negative crank, negative clunk, and good stability. Normal scapular function observed. No painful arc and no  drop arm sign. No apprehension sign  Impression and Recommendations:    Right rotator cuff tendinitis Subscapularis tendinopathy has improved considerably, still has a bit of pain with resisted internal rotation of physical therapy has done wonders, he will do the rehabilitation exercises every other day, and he can get back into the gym when he is able to do resisted internal rotation without any discomfort.

## 2017-05-24 NOTE — Progress Notes (Signed)
Pt is here for follow up on right shoulder.  Has been to PT and almost finished with visits.  States that his shoulder is much better.

## 2017-05-24 NOTE — Assessment & Plan Note (Signed)
Subscapularis tendinopathy has improved considerably, still has a bit of pain with resisted internal rotation of physical therapy has done wonders, he will do the rehabilitation exercises every other day, and he can get back into the gym when he is able to do resisted internal rotation without any discomfort.

## 2017-06-06 DIAGNOSIS — D229 Melanocytic nevi, unspecified: Secondary | ICD-10-CM | POA: Diagnosis not present

## 2017-06-06 DIAGNOSIS — L722 Steatocystoma multiplex: Secondary | ICD-10-CM | POA: Diagnosis not present

## 2017-06-06 DIAGNOSIS — B360 Pityriasis versicolor: Secondary | ICD-10-CM | POA: Diagnosis not present

## 2017-06-27 MED FILL — SIMVASTATIN 20 MG TABLET: 20 | 90 days supply | Qty: 90 | Fill #2

## 2017-07-04 MED FILL — LISINOPRIL 10 MG TABLET: 10 | 90 days supply | Qty: 90 | Fill #2

## 2017-07-04 MED FILL — HYDROCHLOROTHIAZIDE 12.5 MG: 12.5 | 90 days supply | Qty: 90 | Fill #2

## 2017-07-06 ENCOUNTER — Ambulatory Visit (INDEPENDENT_AMBULATORY_CARE_PROVIDER_SITE_OTHER): Payer: 59 | Admitting: Sports Medicine

## 2017-07-06 ENCOUNTER — Encounter: Payer: Self-pay | Admitting: Sports Medicine

## 2017-07-06 DIAGNOSIS — M5416 Radiculopathy, lumbar region: Secondary | ICD-10-CM

## 2017-07-06 NOTE — Progress Notes (Signed)
  Subjective:    CC: Follow-up  HPI:  Low back pain: Check returns, he's one of our [employer], he has known multilevel lumbar degenerative disc disease albeit mild, he's done extremely well in the past with occasional bursts of prednisone.  His pain now is on the right side, axial without radicular symptoms. No bowel or bladder dysfunction, saddle numbness, constitutional symptoms. During the day he has no pain, he does have a bit of discomfort at night. He already has some prednisone and Flexeril that he has not really taken.  Past medical history:  Negative.  See flowsheet/record as well for more information.  Surgical history: Negative.  See flowsheet/record as well for more information.  Family history: Negative.  See flowsheet/record as well for more information.  Social history: Negative.  See flowsheet/record as well for more information.  Allergies, and medications have been entered into the medical record, reviewed, and no changes needed.   Review of Systems: No fevers, chills, night sweats, weight loss, chest pain, or shortness of breath.   Objective:    General: Well Developed, well nourished, and in no acute distress.  Neuro: Alert and oriented x3, extra-ocular muscles intact, sensation grossly intact.  HEENT: Normocephalic, atraumatic, pupils equal round reactive to light, neck supple, no masses, no lymphadenopathy, thyroid nonpalpable.  Skin: Warm and dry, no rashes. Cardiac: Regular rate and rhythm, no murmurs rubs or gallops, no lower extremity edema.  Respiratory: Clear to auscultation bilaterally. Not using accessory muscles, speaking in full sentences. Back Exam:  Inspection: Unremarkable  Motion: Flexion 45 deg, Extension 45 deg, Side Bending to 45 deg bilaterally,  Rotation to 45 deg bilaterally  SLR laying: Negative  XSLR laying: Negative  Palpable tenderness: None. FABER: negative. Sensory change: Gross sensation intact to all lumbar and sacral dermatomes.   Reflexes: 2+ at both patellar tendons, 2+ at achilles tendons, Babinski's downgoing.  Strength at foot  Plantar-flexion: 5/5 Dorsi-flexion: 5/5 Eversion: 5/5 Inversion: 5/5  Leg strength  Quad: 5/5 Hamstring: 5/5 Hip flexor: 5/5 Hip abductors: 5/5  Gait unremarkable.  Impression and Recommendations:    Right lumbar radiculitis Mild pain, similar in nature to previously. It was not bad enough to consider prednisone however his pain is at night so he does have some Flexeril that he is going to take at bedtime for the next couple of weeks. Also asked him to get more diligent with his rehabilitation exercises, in 2 weeks he'll call me if not better and we can do a burst of prednisone.

## 2017-07-06 NOTE — Assessment & Plan Note (Signed)
Mild pain, similar in nature to previously. It was not bad enough to consider prednisone however his pain is at night so he does have some Flexeril that he is going to take at bedtime for the next couple of weeks. Also asked him to get more diligent with his rehabilitation exercises, in 2 weeks he'll call me if not better and we can do a burst of prednisone.

## 2017-07-12 MED FILL — PROCTO-MED HC 2.5% CREAM: 2.5 | 10 days supply | Qty: 30 | Fill #0

## 2017-08-21 MED FILL — MELOXICAM 15 MG TABLET: 15 | 30 days supply | Qty: 30 | Fill #1

## 2017-08-21 MED FILL — CYCLOBENZAPRINE 10 MG TABLE: 10 | 15 days supply | Qty: 30 | Fill #1

## 2017-09-10 MED FILL — MINOCYCLINE 50 MG CAPSULE: 50 | 30 days supply | Qty: 60 | Fill #0

## 2017-09-11 DIAGNOSIS — B360 Pityriasis versicolor: Secondary | ICD-10-CM | POA: Diagnosis not present

## 2017-09-11 DIAGNOSIS — L0390 Cellulitis, unspecified: Secondary | ICD-10-CM | POA: Diagnosis not present

## 2017-09-11 DIAGNOSIS — L723 Sebaceous cyst: Secondary | ICD-10-CM | POA: Diagnosis not present

## 2017-09-11 DIAGNOSIS — L089 Local infection of the skin and subcutaneous tissue, unspecified: Secondary | ICD-10-CM | POA: Diagnosis not present

## 2017-09-11 MED FILL — SULFAMETHOXAZOLE-TMP DS TAB: 800-160 | 14 days supply | Qty: 28 | Fill #0

## 2017-09-16 ENCOUNTER — Encounter: Payer: Self-pay | Admitting: Emergency Medicine

## 2017-09-16 ENCOUNTER — Emergency Department
Admission: EM | Admit: 2017-09-16 | Discharge: 2017-09-16 | Disposition: A | Discharge status: Home / Self Care | Payer: 59 | Source: Home / Self Care | Attending: Family Medicine | Admitting: Family Medicine

## 2017-09-16 DIAGNOSIS — M545 Low back pain, unspecified: Secondary | ICD-10-CM

## 2017-09-16 MED ORDER — KETOROLAC TROMETHAMINE 60 MG/2ML IM SOLN
60.0000 mg | Freq: Once | INTRAMUSCULAR | Status: AC
  Administered 2017-09-16: 60 mg via INTRAMUSCULAR

## 2017-09-16 MED ORDER — PREDNISONE 20 MG PO TABS: ORAL_TABLET | ORAL | 0 refills | Status: DC

## 2017-09-16 MED ORDER — METHYLPREDNISOLONE ACETATE 80 MG/ML IJ SUSP
80.0000 mg | Freq: Once | INTRAMUSCULAR | Status: AC
  Administered 2017-09-16: 80 mg via INTRAMUSCULAR

## 2017-09-16 NOTE — Discharge Instructions (Signed)
°  You were given a shot of depo-medrol (a steroid) today to help with muscle pain and inflammation.  You have been prescribed prednisone, an oral steroid.  You may start this medication tomorrow with breakfast.    You may also continue to take your cyclobenzaprine (Flexeril-muscle relaxer), mexlociam (anti-inflammatory) and hydrocodone as prescribed along with today's added prednisone to help with pain.

## 2017-09-16 NOTE — ED Provider Notes (Signed)
[facility]    CSN: [id number] Arrival date & time: 09/16/17  1401     History   Chief Complaint Chief Complaint  Patient presents with  . Back Pain    HPI LOPEZ DENTINGER is a 56 y.o. male.   HPI  RANJIT ASHURST is a 56 y.o. male presenting to UC with c/o low back pain that started yesterday after bending down. Hx of 3 herniated discs in his lower back.  He has been seen by Dr. Dianah Field, Sports Medicine, in the past for his back but had been doing well.  He has not needed to see him for his back since December 2017.  He has tried leftover cyclobenzaprine, meloxicam, and hydrococone w/o relief.  Pain is aching and sharp spasm at times, 9/10. Denies radiation of pain or numbness in arms or legs.      Past Medical History:  Diagnosis Date  . Hyperlipidemia     Patient Active Problem List   Diagnosis Date Noted  . Right rotator cuff tendinitis 04/12/2017  . Right lumbar radiculitis 09/29/2014  . Foot pain, left 09/08/2014  . HYPERLIPIDEMIA 10/09/2010    Past Surgical History:  Procedure Laterality Date  . APPENDECTOMY         Home Medications    Prior to Admission medications   Medication Sig Start Date End Date Taking? Authorizing Provider  candesartan-hydrochlorothiazide (ATACAND HCT) 32-12.5 MG tablet Take 1 tablet by mouth daily.   Yes [provider]  lisinopril (PRINIVIL,ZESTRIL) 10 MG tablet Take 10 mg by mouth daily.   Yes [provider]  simvastatin (ZOCOR) 20 MG tablet Take 20 mg by mouth daily.   Yes [provider]  predniSONE (DELTASONE) 20 MG tablet 3 tabs po day one, then 2 po daily x 4 days 09/16/17   Noe Gens, PA-C    Family History Family History  Problem Relation Age of Onset  . Hyperlipidemia Mother   . Hyperlipidemia Father     Social History Social History  Substance Use Topics  . Smoking status: Never Smoker  . Smokeless tobacco: Never Used  . Alcohol use Yes   Comment: 2 q wk     Allergies   Patient has no known allergies.   Review of Systems Review of Systems  Constitutional: Negative for chills and fever.  Genitourinary: Negative for dysuria, flank pain, frequency and hematuria.  Musculoskeletal: Positive for back pain and myalgias.  Skin: Negative for rash.  Neurological: Negative for weakness and numbness.     Physical Exam Triage Vital Signs ED Triage Vitals  Enc Vitals Group     BP 09/16/17 1422 134/74     Pulse Rate 09/16/17 1422 77     Resp --      Temp 09/16/17 1422 98.3 F (36.8 C)     Temp Source 09/16/17 1422 Oral     SpO2 09/16/17 1422 99 %     Weight --      Height --      Head Circumference --      Peak Flow --      Pain Score 09/16/17 1423 9     Pain Loc --      Pain Edu? --      Excl. in [place]? --    No data found.   Updated Vital Signs BP 134/74 (BP Location: Left Arm)   Pulse 77   Temp 98.3 F (36.8 C) (Oral)   SpO2 99%   Visual  Acuity Right Eye Distance:   Left Eye Distance:   Bilateral Distance:    Right Eye Near:   Left Eye Near:    Bilateral Near:     Physical Exam  Constitutional: He is oriented to person, place, and time. He appears well-developed and well-nourished. No distress.  HENT:  Head: Normocephalic and atraumatic.  Eyes: EOM are normal.  Neck: Normal range of motion.  Cardiovascular: Normal rate.   Pulmonary/Chest: Effort normal. No respiratory distress.  Musculoskeletal: Normal range of motion. He exhibits tenderness. He exhibits no edema.  No midline spinal tenderness. Tenderness to lower lumbar paraspinal muscles bilaterally. Full ROM upper and lower extremities bilaterally Normal gait. Slowed position changes.   Neurological: He is alert and oriented to person, place, and time.  Skin: Skin is warm and dry. No rash noted. He is not diaphoretic.  Psychiatric: He has a normal mood and affect. His behavior is normal.  Nursing note and vitals reviewed.    UC Treatments  / Results  Labs (all labs ordered are listed, but only abnormal results are displayed) Labs Reviewed - No data to display  EKG  EKG Interpretation None       Radiology No results found.  Procedures Procedures (including critical care time)  Medications Ordered in UC Medications  ketorolac (TORADOL) injection 60 mg (60 mg Intramuscular Given 09/16/17 1438)  methylPREDNISolone acetate (DEPO-MEDROL) injection 80 mg (80 mg Intramuscular Given 09/16/17 1438)     Initial Impression / Assessment and Plan / UC Course  I have reviewed the triage vital signs and the nursing notes.  Pertinent labs & imaging results that were available during my care of the patient were reviewed by me and considered in my medical decision making (see chart for details).     Hx and exam c/w back muscle spasm/strain  Toradol 60mg  IM and Depo-medrol 80mg  IM given in UC May continue to take his Meloxicam, cyclobenzaprine and hydrocodone as prescribed F/u with Dr. Dianah Field as needed later this week.    Final Clinical Impressions(s) / UC Diagnoses   Final diagnoses:  Acute bilateral low back pain without sciatica    New Prescriptions Discharge Medication List as of 09/16/2017  2:33 PM    START taking these medications   Details  predniSONE (DELTASONE) 20 MG tablet 3 tabs po day one, then 2 po daily x 4 days, Normal         Controlled Substance Prescriptions [place] Controlled Substance Registry consulted? Not Applicable   Tyrell Antonio 09/16/17 1503

## 2017-09-16 NOTE — ED Triage Notes (Signed)
Back pain x 1 day

## 2017-09-17 ENCOUNTER — Encounter: Payer: Self-pay | Admitting: Sports Medicine

## 2017-09-17 MED ORDER — CYCLOBENZAPRINE HCL 10 MG PO TABS
ORAL_TABLET | ORAL | 0 refills | Status: DC
Start: 2017-09-17 — End: 2021-10-12

## 2017-09-17 MED ORDER — HYDROCODONE-ACETAMINOPHEN 5-325 MG PO TABS: 1.0000 | ORAL_TABLET | Freq: Three times a day (TID) | ORAL | 0 refills | Status: DC | PRN

## 2017-09-17 MED FILL — CYCLOBENZAPRINE 10 MG TAB: 10 | 10 days supply | Qty: 30 | Fill #0

## 2017-09-21 MED FILL — HYDROCODON-APAP 5-325: 5-325 | 5 days supply | Qty: 15 | Fill #0

## 2017-09-24 MED FILL — SIMVASTATIN 20 MG TABLET: 20 | 90 days supply | Qty: 90 | Fill #3

## 2017-10-01 MED FILL — LISINOPRIL 10 MG TABS: 10 | 90 days supply | Qty: 90 | Fill #3

## 2017-10-01 MED FILL — HYDROCHLOROTHIAZIDE 12.5 MG: 12.5 | 90 days supply | Qty: 90 | Fill #3

## 2017-10-22 MED FILL — MINOCYCLINE 50 MG CAPSULE: 50 | 30 days supply | Qty: 60 | Fill #1

## 2017-11-02 DIAGNOSIS — F419 Anxiety disorder, unspecified: Secondary | ICD-10-CM | POA: Diagnosis not present

## 2017-11-02 DIAGNOSIS — E785 Hyperlipidemia, unspecified: Secondary | ICD-10-CM | POA: Diagnosis not present

## 2017-11-02 DIAGNOSIS — I10 Essential (primary) hypertension: Secondary | ICD-10-CM | POA: Diagnosis not present

## 2017-11-02 DIAGNOSIS — Z0000 Encounter for general adult medical examination without abnormal findings: Secondary | ICD-10-CM | POA: Diagnosis not present

## 2017-11-28 MED FILL — MINOCYCLINE 50 MG CAPSULE: 50 | 30 days supply | Qty: 60 | Fill #2

## 2018-01-01 MED FILL — ALPRAZolam 0.5 MG TABS: 0.5 | 30 days supply | Qty: 30 | Fill #0

## 2018-01-02 MED FILL — MINOCYCLINE 50 MG CAPSULE: 50 | 30 days supply | Qty: 60 | Fill #3

## 2018-01-03 MED FILL — SIMVASTATIN 20 MG TABS: 20 | 90 days supply | Qty: 90 | Fill #0

## 2018-01-23 MED FILL — HYDROCHLOROTHIAZIDE 12.5 MG: 12.5 | 90 days supply | Qty: 90 | Fill #0

## 2018-01-23 MED FILL — LISINOPRIL 10 MG TABS: 10 | 90 days supply | Qty: 90 | Fill #0

## 2018-02-06 MED FILL — MINOCYCLINE 50 MG CAPSULE: 50 | 30 days supply | Qty: 60 | Fill #4

## 2018-03-11 MED FILL — MINOCYCLINE 50 MG CAPSULE: 50 | 30 days supply | Qty: 60 | Fill #5

## 2018-04-10 MED FILL — MINOCYCLINE 50 MG CAPSULE: 50 | 30 days supply | Qty: 60 | Fill #6

## 2018-04-10 MED FILL — SIMVASTATIN 20 MG TABS: 20 | 90 days supply | Qty: 90 | Fill #1

## 2018-05-13 MED FILL — HYDROCHLOROTHIAZIDE 12.5 MG: 12.5 | 90 days supply | Qty: 90 | Fill #1

## 2018-05-13 MED FILL — LISINOPRIL 10 MG TABLET: 10 | 90 days supply | Qty: 90 | Fill #1

## 2018-05-23 MED FILL — MINOCYCLINE 50 MG CAPSULE: 50 | 30 days supply | Qty: 60 | Fill #7

## 2018-06-27 ENCOUNTER — Ambulatory Visit: Payer: 59 | Admitting: Family Medicine

## 2018-06-27 ENCOUNTER — Encounter: Payer: Self-pay | Admitting: Family Medicine

## 2018-06-27 VITALS — BP 118/82 | HR 76 | Ht 73.0 in | Wt 180.0 lb

## 2018-06-27 DIAGNOSIS — M5416 Radiculopathy, lumbar region: Secondary | ICD-10-CM

## 2018-06-27 MED ORDER — PREDNISONE 50 MG PO TABS: 50.0000 mg | ORAL_TABLET | Freq: Every day | ORAL | 0 refills | Status: DC

## 2018-06-27 MED ORDER — METHYLPREDNISOLONE ACETATE 80 MG/ML IJ SUSP
80.0000 mg | Freq: Once | INTRAMUSCULAR | Status: AC
  Administered 2018-06-27: 80 mg via INTRAMUSCULAR

## 2018-06-27 MED ORDER — KETOROLAC TROMETHAMINE 60 MG/2ML IM SOLN
60.0000 mg | Freq: Once | INTRAMUSCULAR | Status: AC
  Administered 2018-06-27: 60 mg via INTRAMUSCULAR

## 2018-06-27 MED FILL — predniSONE 50 MG TABS: 50 | 5 days supply | Qty: 5 | Fill #0

## 2018-06-27 NOTE — Assessment & Plan Note (Signed)
Patient is having more of a back spasm.  Patient did have significant tightness noted of the hamstring and could be more of the radicular symptoms.  Discussed with patient in great length.  Decided to do a 2 injections, discussed icing regimen, discussed home exercise.  Patient agreed to increase activity as tolerated.  Follow-up with me again 1 week

## 2018-06-27 NOTE — Progress Notes (Signed)
[name] [address] [place], [place] [address] Phone: [phone] Subjective:     CC: Back pain  [id number]  Larry [name] is a 57 y.o. male coming in with complaint of back pain. Has been having pain since last Friday and today experienced some spasms. Does have 3 herniated discs. Denies any radicular symptoms. Does seem to occur on 6 months.  Patient denies any radiation of the leg, denies any numbness.  States it seems to stay very localized to the back.  Patient's history exacerbation like this happen  years ago.  Had MRI.  This was independently visualized by me.  Mild to moderate arthritic changes but no true nerve impingement.      Past Medical History:  Diagnosis Date  . Hyperlipidemia    Past Surgical History:  Procedure Laterality Date  . APPENDECTOMY     Social History   Socioeconomic History  . Marital status: Married    Spouse name: Not on file  . Number of children: Not on file  . Years of education: Not on file  . Highest education level: Not on file  Occupational History  . Not on file  Social Needs  . Financial resource strain: Not on file  . Food insecurity:    Worry: Not on file    Inability: Not on file  . Transportation needs:    Medical: Not on file    Non-medical: Not on file  Tobacco Use  . Smoking status: Never Smoker  . Smokeless tobacco: Never Used  Substance and Sexual Activity  . Alcohol use: Yes    Comment: 2 q wk  . Drug use: No  . Sexual activity: Not on file  Lifestyle  . Physical activity:    Days per week: Not on file    Minutes per session: Not on file  . Stress: Not on file  Relationships  . Social connections:    Talks on phone: Not on file    Gets together: Not on file    Attends religious service: Not on file    Active member of club or organization: Not on file    Attends meetings of clubs or organizations: Not on file    Relationship status: Not on file  Other Topics  Concern  . Not on file  Social History Narrative  . Not on file   No Known Allergies Family History  Problem Relation Age of Onset  . Hyperlipidemia Mother   . Hyperlipidemia Father      Past medical history, social, surgical and family history all reviewed in electronic medical record.  No pertanent information unless stated regarding to the chief complaint.   Review of Systems:Review of systems updated and as accurate as of 06/27/18  No headache, visual changes, nausea, vomiting, diarrhea, constipation, dizziness, abdominal pain, skin rash, fevers, chills, night sweats, weight loss, swollen lymph nodes, body aches, joint swelling, muscle aches, chest pain, shortness of breath, mood changes.   Objective  Blood pressure 118/82, pulse 76, height 6\' 1"  (1.854 m), weight 180 lb (81.6 kg), SpO2 97 %. Systems examined below as of 06/27/18   General: No apparent distress alert and oriented x3 mood and affect normal, dressed appropriately.  HEENT: Pupils equal, extraocular movements intact  Respiratory: Patient's speak in full sentences and does not appear short of breath  Cardiovascular: No lower extremity edema, non tender, no erythema  Skin: Warm dry intact with no signs of infection or rash on extremities  or on axial skeleton.  Abdomen: Soft nontender  Neuro: Cranial nerves II through XII are intact, neurovascularly intact in all extremities with 2+ DTRs and 2+ pulses.  Lymph: No lymphadenopathy of posterior or anterior cervical chain or axillae bilaterally.  Gait normal with good balance and coordination.  MSK:  Non tender with full range of motion and good stability and symmetric strength and tone of shoulders, elbows, wrist, hip, knee and ankles bilaterally.  Back Exam:  Inspection: Loss of lordosis Motion: Flexion 35 deg, Extension 15 deg, Side Bending to 35 deg bilaterally,  Rotation to 35 deg bilaterally  SLR laying: Negative tight bilaterally XSLR laying: Negative  Palpable  tenderness: Moderate severe tenderness in the paraspinal musculature in the lumbar spine bilaterally. FABER: Tightness bilaterally. Sensory change: Gross sensation intact to all lumbar and sacral dermatomes.  Reflexes: 2+ at both patellar tendons, 2+ at achilles tendons, Babinski's downgoing.  Strength at foot  Plantar-flexion: 5/5 Dorsi-flexion: 5/5 Eversion: 5/5 Inversion: 5/5  Leg strength  Quad: 5/5 Hamstring: 5/5 Hip flexor: 5/5 Hip abductors: 5/5  Gait unremarkable.   Impression and Recommendations:     This case required medical decision making of moderate complexity.      Note: This dictation was prepared with Dragon dictation along with smaller phrase technology. Any transcriptional errors that result from this process are unintentional.

## 2018-06-27 NOTE — Patient Instructions (Addendum)
Good to see you  2 injection today  Ice 20 minutes 2 times daily. Usually after activity and before bed. Starting tomorrow Prednisone daily for 5 days Flexeril at night for 3 nights then as needed Exercises 3 times a week.   OK to lift but 50% of weight until I see you  Bike or elliptical  Vitamin D 2000 IU daily  See me again in 7-14 days (ok to double book) or call me sooner if worsening.

## 2018-06-28 ENCOUNTER — Ambulatory Visit: Payer: 59 | Admitting: Family Medicine

## 2018-07-01 ENCOUNTER — Ambulatory Visit: Payer: 59 | Admitting: Family Medicine

## 2018-07-01 MED FILL — MINOCYCLINE 50 MG CAPSULE: 50 | 30 days supply | Qty: 60 | Fill #0

## 2018-07-03 DIAGNOSIS — M9902 Segmental and somatic dysfunction of thoracic region: Secondary | ICD-10-CM | POA: Diagnosis not present

## 2018-07-03 DIAGNOSIS — M5137 Other intervertebral disc degeneration, lumbosacral region: Secondary | ICD-10-CM | POA: Diagnosis not present

## 2018-07-03 DIAGNOSIS — M5126 Other intervertebral disc displacement, lumbar region: Secondary | ICD-10-CM | POA: Diagnosis not present

## 2018-07-03 DIAGNOSIS — M7918 Myalgia, other site: Secondary | ICD-10-CM | POA: Diagnosis not present

## 2018-07-03 DIAGNOSIS — M545 Low back pain: Secondary | ICD-10-CM | POA: Diagnosis not present

## 2018-07-03 DIAGNOSIS — M9905 Segmental and somatic dysfunction of pelvic region: Secondary | ICD-10-CM | POA: Diagnosis not present

## 2018-07-03 DIAGNOSIS — M9903 Segmental and somatic dysfunction of lumbar region: Secondary | ICD-10-CM | POA: Diagnosis not present

## 2018-07-03 DIAGNOSIS — M9901 Segmental and somatic dysfunction of cervical region: Secondary | ICD-10-CM | POA: Diagnosis not present

## 2018-07-03 DIAGNOSIS — M47817 Spondylosis without myelopathy or radiculopathy, lumbosacral region: Secondary | ICD-10-CM | POA: Diagnosis not present

## 2018-07-08 NOTE — Progress Notes (Signed)
[name] [address] [place], [place] [address] Phone: [phone] Subjective:    I'm seeing this patient by the request  of:    CC: Back pain follow-up  [id number]  Larry [name] VANECEK is a 57 y.o. male coming in with complaint of back pain. He was here on a Thursday and had relief by Friday afternoon. Still does have pain in the mornings. Patient also feels tightness in chairs that are lower than usual. Can get sharp pain when in a chair that is low. Is back in the gym and is doing well with that. Also has been adjusted by a chiropractor since last visit.  Patient is doing much better.  States that he is feeling 95% better.  Still some mild discomfort but nothing severe.  Is working out on a more regular basis again.     Past Medical History:  Diagnosis Date  . Hyperlipidemia    Past Surgical History:  Procedure Laterality Date  . APPENDECTOMY     Social History   Socioeconomic History  . Marital status: Married    Spouse name: Not on file  . Number of children: Not on file  . Years of education: Not on file  . Highest education level: Not on file  Occupational History  . Not on file  Social Needs  . Financial resource strain: Not on file  . Food insecurity:    Worry: Not on file    Inability: Not on file  . Transportation needs:    Medical: Not on file    Non-medical: Not on file  Tobacco Use  . Smoking status: Never Smoker  . Smokeless tobacco: Never Used  Substance and Sexual Activity  . Alcohol use: Yes    Comment: 2 q wk  . Drug use: No  . Sexual activity: Not on file  Lifestyle  . Physical activity:    Days per week: Not on file    Minutes per session: Not on file  . Stress: Not on file  Relationships  . Social connections:    Talks on phone: Not on file    Gets together: Not on file    Attends religious service: Not on file    Active member of club or organization: Not on file    Attends meetings of clubs or  organizations: Not on file    Relationship status: Not on file  Other Topics Concern  . Not on file  Social History Narrative  . Not on file   No Known Allergies Family History  Problem Relation Age of Onset  . Hyperlipidemia Mother   . Hyperlipidemia Father      Past medical history, social, surgical and family history all reviewed in electronic medical record.  No pertanent information unless stated regarding to the chief complaint.   Review of Systems:Review of systems updated and as accurate as of 07/09/18  No headache, visual changes, nausea, vomiting, diarrhea, constipation, dizziness, abdominal pain, skin rash, fevers, chills, night sweats, weight loss, swollen lymph nodes, body aches, joint swelling, chest pain, shortness of breath, mood changes.  Positive muscle aches  Objective  Blood pressure 112/82, pulse 87, height 6\' 1"  (1.854 m), weight 176 lb (79.8 kg), SpO2 97 %. Systems examined below as of 07/09/18   General: No apparent distress alert and oriented x3 mood and affect normal, dressed appropriately.  HEENT: Pupils equal, extraocular movements intact  Respiratory: Patient's speak in full sentences and does not appear short of  breath  Cardiovascular: No lower extremity edema, non tender, no erythema  Skin: Warm dry intact with no signs of infection or rash on extremities or on axial skeleton.  Abdomen: Soft nontender  Neuro: Cranial nerves II through XII are intact, neurovascularly intact in all extremities with 2+ DTRs and 2+ pulses.  Lymph: No lymphadenopathy of posterior or anterior cervical chain or axillae bilaterally.  Gait normal with good balance and coordination.  MSK:  Non tender with full range of motion and good stability and symmetric strength and tone of shoulders, elbows, wrist, hip, knee and ankles bilaterally.  Back Exam:  Inspection: Unremarkable  Motion: Flexion 40 deg, Extension 25 deg, Side Bending to 30 deg bilaterally,  Rotation to 35 deg  bilaterally  SLR laying: Negative  XSLR laying: Negative  Palpable tenderness: Tender to palpation the paraspinal musculature. FABER: Tightness bilaterally. Sensory change: Gross sensation intact to all lumbar and sacral dermatomes.  Reflexes: 2+ at both patellar tendons, 2+ at achilles tendons, Babinski's downgoing.  Strength at foot  Plantar-flexion: 5/5 Dorsi-flexion: 5/5 Eversion: 5/5 Inversion: 5/5  Leg strength  Quad: 5/5 Hamstring: 5/5 Hip flexor: 5/5 Hip abductors: 5/5  Gait unremarkable.  Osteopathic findings C2 flexed rotated and side bent right T11 extended rotated and side bent left L2 flexed rotated and side bent right Sacrum right on right     Impression and Recommendations:     This case required medical decision making of moderate complexity.      Note: This dictation was prepared with Dragon dictation along with smaller phrase technology. Any transcriptional errors that result from this process are unintentional.

## 2018-07-09 ENCOUNTER — Ambulatory Visit: Payer: 59 | Admitting: Family Medicine

## 2018-07-09 ENCOUNTER — Encounter: Payer: Self-pay | Admitting: Family Medicine

## 2018-07-09 VITALS — BP 112/82 | HR 87 | Ht 73.0 in | Wt 176.0 lb

## 2018-07-09 DIAGNOSIS — M5416 Radiculopathy, lumbar region: Secondary | ICD-10-CM

## 2018-07-09 DIAGNOSIS — M9901 Segmental and somatic dysfunction of cervical region: Secondary | ICD-10-CM | POA: Diagnosis not present

## 2018-07-09 DIAGNOSIS — M9903 Segmental and somatic dysfunction of lumbar region: Secondary | ICD-10-CM | POA: Diagnosis not present

## 2018-07-09 DIAGNOSIS — M9905 Segmental and somatic dysfunction of pelvic region: Secondary | ICD-10-CM | POA: Diagnosis not present

## 2018-07-09 DIAGNOSIS — M999 Biomechanical lesion, unspecified: Secondary | ICD-10-CM | POA: Insufficient documentation

## 2018-07-09 DIAGNOSIS — M5137 Other intervertebral disc degeneration, lumbosacral region: Secondary | ICD-10-CM | POA: Diagnosis not present

## 2018-07-09 DIAGNOSIS — M545 Low back pain: Secondary | ICD-10-CM | POA: Diagnosis not present

## 2018-07-09 DIAGNOSIS — M7918 Myalgia, other site: Secondary | ICD-10-CM | POA: Diagnosis not present

## 2018-07-09 DIAGNOSIS — M9902 Segmental and somatic dysfunction of thoracic region: Secondary | ICD-10-CM | POA: Diagnosis not present

## 2018-07-09 DIAGNOSIS — M5126 Other intervertebral disc displacement, lumbar region: Secondary | ICD-10-CM | POA: Diagnosis not present

## 2018-07-09 DIAGNOSIS — M47817 Spondylosis without myelopathy or radiculopathy, lumbosacral region: Secondary | ICD-10-CM | POA: Diagnosis not present

## 2018-07-09 NOTE — Assessment & Plan Note (Signed)
Decision today to treat with OMT was based on Physical Exam  After verbal consent patient was treated with HVLA, ME, FPR techniques in cervical, thoracic, lumbar and sacral areas  Patient tolerated the procedure well with improvement in symptoms  Patient given exercises, stretches and lifestyle modifications  See medications in patient instructions if given  Patient will follow up in 4 weeks 

## 2018-07-09 NOTE — Patient Instructions (Signed)
Good to see you  Alvera Singh is your friend.  Move the exercises to after working out.  Planks 30 second holds and increase to 2 minutes slowly over time.  You will do great  See me again in 4 weeks

## 2018-07-09 NOTE — Assessment & Plan Note (Signed)
No longer having radicular pain.  Does have some instability with core strengthening encouraged him to work on this.  Responded well to osteopathic manipulation today.  Discussed icing regimen.  Patient will continue with vitamin supplementations and follow-up with me again in 4 weeks

## 2018-07-30 MED FILL — MINOCYCLINE 50 MG CAPSULE: 50 | 30 days supply | Qty: 60 | Fill #1

## 2018-07-30 MED FILL — SIMVASTATIN 20 MG TABLET: 20 | 90 days supply | Qty: 90 | Fill #2

## 2018-08-08 ENCOUNTER — Ambulatory Visit: Payer: 59 | Admitting: Family Medicine

## 2018-08-23 MED FILL — LISINOPRIL 10 MG TABLET: 10 | 90 days supply | Qty: 90 | Fill #2

## 2018-08-23 MED FILL — HYDROCHLOROTHIAZIDE 12.5 MG: 12.5 | 90 days supply | Qty: 90 | Fill #2

## 2018-08-26 NOTE — Progress Notes (Signed)
[name] [address] [place], [place] [address] Phone: [phone] Subjective:   Larry [name], am serving as a scribe for Dr. Hulan Saas.   CC: Back pain  [id number]  Larry [name] is a 57 y.o. male coming in with complaint of back pain. Patient has had intermittent pain in his back but overall had been doing well. Woke up Saturday with pain in left side of cervical spine. Did work out on Monday doing some overhead presses which exacerbated his pain. Denies any radiating symptoms. Has used Mobic for his pain.  Patient though has been using it intermittently not every day.      Past Medical History:  Diagnosis Date  . Hyperlipidemia    Past Surgical History:  Procedure Laterality Date  . APPENDECTOMY     Social History   Socioeconomic History  . Marital status: Married    Spouse name: Not on file  . Number of children: Not on file  . Years of education: Not on file  . Highest education level: Not on file  Occupational History  . Not on file  Social Needs  . Financial resource strain: Not on file  . Food insecurity:    Worry: Not on file    Inability: Not on file  . Transportation needs:    Medical: Not on file    Non-medical: Not on file  Tobacco Use  . Smoking status: Never Smoker  . Smokeless tobacco: Never Used  Substance and Sexual Activity  . Alcohol use: Yes    Comment: 2 q wk  . Drug use: [name]  . Sexual activity: Not on file  Lifestyle  . Physical activity:    Days per week: Not on file    Minutes per session: Not on file  . Stress: Not on file  Relationships  . Social connections:    Talks on phone: Not on file    Gets together: Not on file    Attends religious service: Not on file    Active member of club or organization: Not on file    Attends meetings of clubs or organizations: Not on file    Relationship status: Not on file  Other Topics Concern  . Not on file  Social History Narrative  . Not  on file   [name] Known Allergies Family History  Problem Relation Age of Onset  . Hyperlipidemia Mother   . Hyperlipidemia Father     Current Outpatient Medications (Endocrine & Metabolic):  .  predniSONE (DELTASONE) 50 MG tablet, Take 1 tablet (50 mg total) by mouth daily.  Current Outpatient Medications (Cardiovascular):  .  candesartan-hydrochlorothiazide (ATACAND HCT) 32-12.5 MG tablet, Take 1 tablet by mouth daily. Marland Kitchen  lisinopril (PRINIVIL,ZESTRIL) 10 MG tablet, Take 10 mg by mouth daily. .  simvastatin (ZOCOR) 20 MG tablet, Take 20 mg by mouth daily.   Current Outpatient Medications (Analgesics):  .  HYDROcodone-acetaminophen (NORCO/VICODIN) 5-325 MG tablet, Take 1 tablet by mouth every 8 (eight) hours as needed for moderate pain.   Current Outpatient Medications (Other):  .  cyclobenzaprine (FLEXERIL) 10 MG tablet, One half tab PO qHS, then increase gradually to one tab TID.    Past medical history, social, surgical and family history all reviewed in electronic medical record.  [name] pertanent information unless stated regarding to the chief complaint.   Review of Systems:  [name] headache, visual changes, nausea, vomiting, diarrhea, constipation, dizziness, abdominal pain, skin rash, fevers, chills, night sweats, weight  loss, swollen lymph nodes, body aches, joint swelling, muscle aches, chest pain, shortness of breath, mood changes.   Objective  Blood pressure 122/82, pulse 74, height 6\' 1"  (1.854 m), weight 175 lb (79.4 kg), SpO2 98 %.    General: [name] apparent distress alert and oriented x3 mood and affect normal, dressed appropriately.  HEENT: Pupils equal, extraocular movements intact  Respiratory: Patient's speak in full sentences and does not appear short of breath  Cardiovascular: [name] lower extremity edema, non tender, [name] erythema  Skin: Warm dry intact with [name] signs of infection or rash on extremities or on axial skeleton.  Abdomen: Soft nontender  Neuro: Cranial nerves II  through XII are intact, neurovascularly intact in all extremities with 2+ DTRs and 2+ pulses.  Lymph: [name] lymphadenopathy of posterior or anterior cervical chain or axillae bilaterally.  Gait normal with good balance and coordination.  MSK:  Non tender with full range of motion and good stability and symmetric strength and tone of shoulders, elbows, wrist, hip, knee and ankles bilaterally.  Neck: Inspection loss of lordosis. [name] palpable stepoffs. Negative Spurling's maneuver. Limited range of motion lacking last 10 degrees of rotation to the left as well as sidebending. Grip strength and sensation normal in bilateral hands Strength good C4 to T1 distribution [name] sensory change to C4 to T1 Negative Hoffman sign bilaterally Reflexes normal Tenderness left trapezius  Low back exam does show mild loss of lordosis.  Some tenderness to palpation over the sacroiliac joint.  Right greater than left.  Mild positive Corky Sox.  [name] pain with straight leg test though.  Mild tightness in all planes  Osteopathic findings C4 flexed rotated and side bent left C6 flexed rotated and side bent left T3 extended rotated and side bent right inhaled third rib T5 extended rotated and side bent left L2 flexed rotated and side bent right Sacrum right on right     Impression and Recommendations:     This case required medical decision making of moderate complexity. The above documentation has been reviewed and is accurate and complete Lyndal Pulley, DO       Note: This dictation was prepared with Dragon dictation along with smaller phrase technology. Any transcriptional errors that result from this process are unintentional.

## 2018-08-27 ENCOUNTER — Ambulatory Visit: Payer: 59 | Admitting: Family Medicine

## 2018-08-27 ENCOUNTER — Encounter: Payer: Self-pay | Admitting: Family Medicine

## 2018-08-27 VITALS — BP 122/82 | HR 74 | Ht 73.0 in | Wt 175.0 lb

## 2018-08-27 DIAGNOSIS — M999 Biomechanical lesion, unspecified: Secondary | ICD-10-CM

## 2018-08-27 DIAGNOSIS — M5416 Radiculopathy, lumbar region: Secondary | ICD-10-CM | POA: Diagnosis not present

## 2018-08-27 NOTE — Patient Instructions (Signed)
Good to see you  Ice is your friend Keep hands within peripheral vision  Stay active See em again in 5-6 weeks

## 2018-08-27 NOTE — Assessment & Plan Note (Signed)
No longer having radicular symptoms.  Had more increase in tightness of the neck today.  Discussed icing regimen and home exercises.  Discussed posture and ergonomics.  Discussed which activities to avoid.  Patient will continue to work out.  We discussed some mild restrictions.  Follow-up again in 4 to 6 weeks

## 2018-08-27 NOTE — Assessment & Plan Note (Signed)
Decision today to treat with OMT was based on Physical Exam  After verbal consent patient was treated with HVLA, ME, FPR techniques in cervical, thoracic, rib lumbar and sacral areas  Patient tolerated the procedure well with improvement in symptoms  Patient given exercises, stretches and lifestyle modifications  See medications in patient instructions if given  Patient will follow up in 4 weeks 

## 2018-09-09 MED FILL — MINOCYCLINE 50 MG CAPSULE: 50 | 30 days supply | Qty: 60 | Fill #0

## 2018-10-01 NOTE — Progress Notes (Signed)
[name] [address] [place], [place] [address] Phone: [phone] Subjective:   [name], am serving as a scribe for Dr. Hulan Saas.  I'm seeing this patient by the request  of:    CC: Back pain  [id number]  Larry [name] is a 57 y.o. male coming in with complaint of back pain.  Patient has been doing relatively better.  No radicular symptoms.  No severe pain.  Has been working out on a more regular basis.      Past Medical History:  Diagnosis Date  . Hyperlipidemia    Past Surgical History:  Procedure Laterality Date  . APPENDECTOMY     Social History   Socioeconomic History  . Marital status: Married    Spouse name: Not on file  . Number of children: Not on file  . Years of education: Not on file  . Highest education level: Not on file  Occupational History  . Not on file  Social Needs  . Financial resource strain: Not on file  . Food insecurity:    Worry: Not on file    Inability: Not on file  . Transportation needs:    Medical: Not on file    Non-medical: Not on file  Tobacco Use  . Smoking status: Never Smoker  . Smokeless tobacco: Never Used  Substance and Sexual Activity  . Alcohol use: Yes    Comment: 2 q wk  . Drug use: No  . Sexual activity: Not on file  Lifestyle  . Physical activity:    Days per week: Not on file    Minutes per session: Not on file  . Stress: Not on file  Relationships  . Social connections:    Talks on phone: Not on file    Gets together: Not on file    Attends religious service: Not on file    Active member of club or organization: Not on file    Attends meetings of clubs or organizations: Not on file    Relationship status: Not on file  Other Topics Concern  . Not on file  Social History Narrative  . Not on file   No Known Allergies Family History  Problem Relation Age of Onset  . Hyperlipidemia Mother   . Hyperlipidemia Father     Current Outpatient  Medications (Endocrine & Metabolic):  .  predniSONE (DELTASONE) 50 MG tablet, Take 1 tablet (50 mg total) by mouth daily.  Current Outpatient Medications (Cardiovascular):  .  candesartan-hydrochlorothiazide (ATACAND HCT) 32-12.5 MG tablet, Take 1 tablet by mouth daily. Marland Kitchen  lisinopril (PRINIVIL,ZESTRIL) 10 MG tablet, Take 10 mg by mouth daily. .  simvastatin (ZOCOR) 20 MG tablet, Take 20 mg by mouth daily.   Current Outpatient Medications (Analgesics):  .  HYDROcodone-acetaminophen (NORCO/VICODIN) 5-325 MG tablet, Take 1 tablet by mouth every 8 (eight) hours as needed for moderate pain.   Current Outpatient Medications (Other):  .  cyclobenzaprine (FLEXERIL) 10 MG tablet, One half tab PO qHS, then increase gradually to one tab TID.    Past medical history, social, surgical and family history all reviewed in electronic medical record.  No pertanent information unless stated regarding to the chief complaint.   Review of Systems:  No headache, visual changes, nausea, vomiting, diarrhea, constipation, dizziness, abdominal pain, skin rash, fevers, chills, night sweats, weight loss, swollen lymph nodes, body aches, joint swelling,  chest pain, shortness of breath, mood changes.  Positive muscle aches  Objective  Blood pressure 110/86, pulse 79, height 6\' 1"  (1.854 m), weight 176 lb (79.8 kg), SpO2 98 %.   General: No apparent distress alert and oriented x3 mood and affect normal, dressed appropriately.  HEENT: Pupils equal, extraocular movements intact  Respiratory: Patient's speak in full sentences and does not appear short of breath  Cardiovascular: No lower extremity edema, non tender, no erythema  Skin: Warm dry intact with no signs of infection or rash on extremities or on axial skeleton.  Abdomen: Soft nontender  Neuro: Cranial nerves II through XII are intact, neurovascularly intact in all extremities with 2+ DTRs and 2+ pulses.  Lymph: No lymphadenopathy of posterior or anterior  cervical chain or axillae bilaterally.  Gait normal with good balance and coordination.  MSK:  Non tender with full range of motion and good stability and symmetric strength and tone of shoulders, elbows, wrist, hip, knee and ankles bilaterally.  Back Exam:  Inspection: Left lordosis Motion: Flexion 35 deg, Extension 25 deg, Side Bending to 35 deg bilaterally,  Rotation to 45 deg bilaterally  SLR laying: Negative  XSLR laying: Negative  Palpable tenderness: Tender to palpation the paraspinal muscle discussed tightness.Corky Sox: Positive Faber. Sensory change: Gross sensation intact to all lumbar and sacral dermatomes.  Reflexes: 2+ at both patellar tendons, 2+ at achilles tendons, Babinski's downgoing.  Strength at foot  Plantar-flexion: 5/5 Dorsi-flexion: 5/5 Eversion: 5/5 Inversion: 5/5  Leg strength  Quad: 5/5 Hamstring: 5/5 Hip flexor: 5/5 Hip abductors: 4/5 but symmetric Gait unremarkable.  Osteopathic findings  T7 extended rotated and side bent left L2 flexed rotated and side bent right Sacrum right on right    Impression and Recommendations:     This case required medical decision making of moderate complexity. The above documentation has been reviewed and is accurate and complete Lyndal Pulley, DO       Note: This dictation was prepared with Dragon dictation along with smaller phrase technology. Any transcriptional errors that result from this process are unintentional.

## 2018-10-03 ENCOUNTER — Ambulatory Visit: Payer: 59 | Admitting: Family Medicine

## 2018-10-03 ENCOUNTER — Encounter: Payer: Self-pay | Admitting: Family Medicine

## 2018-10-03 VITALS — BP 110/86 | HR 79 | Ht 73.0 in | Wt 176.0 lb

## 2018-10-03 DIAGNOSIS — M5416 Radiculopathy, lumbar region: Secondary | ICD-10-CM | POA: Diagnosis not present

## 2018-10-03 DIAGNOSIS — M999 Biomechanical lesion, unspecified: Secondary | ICD-10-CM

## 2018-10-03 NOTE — Assessment & Plan Note (Signed)
Radicular symptoms have discontinued.  Discussed icing regimen

## 2018-10-03 NOTE — Assessment & Plan Note (Addendum)
Decision today to treat with OMT was based on Physical Exam  After verbal consent patient was treated with HVLA, ME, FPR techniques in cervical, thoracic, lumbar and sacral areas  Patient tolerated the procedure well with improvement in symptoms  Patient given exercises, stretches and lifestyle modifications  See medications in patient instructions if given  Patient will follow up in 1-2 weeks 

## 2018-10-03 NOTE — Patient Instructions (Signed)
Good to see you  Larry [name] is your friend I am impressed Keep doing what you are doing Call [phone] if you need [place] Otherwise see me again in 3 months

## 2018-10-17 MED FILL — MINOCYCLINE 50 MG CAPSULE: 50 | 30 days supply | Qty: 60 | Fill #1

## 2018-11-01 MED FILL — ALPRAZolam 0.5 MG TABS: 0.5 | 7 days supply | Qty: 30 | Fill #0

## 2018-11-06 DIAGNOSIS — F419 Anxiety disorder, unspecified: Secondary | ICD-10-CM | POA: Diagnosis not present

## 2018-11-06 DIAGNOSIS — I10 Essential (primary) hypertension: Secondary | ICD-10-CM | POA: Diagnosis not present

## 2018-11-06 DIAGNOSIS — E785 Hyperlipidemia, unspecified: Secondary | ICD-10-CM | POA: Diagnosis not present

## 2018-11-06 DIAGNOSIS — Z0000 Encounter for general adult medical examination without abnormal findings: Secondary | ICD-10-CM | POA: Diagnosis not present

## 2018-11-19 ENCOUNTER — Ambulatory Visit (INDEPENDENT_AMBULATORY_CARE_PROVIDER_SITE_OTHER): Payer: Self-pay | Admitting: Nurse Practitioner

## 2018-11-19 ENCOUNTER — Encounter: Payer: Self-pay | Admitting: Nurse Practitioner

## 2018-11-19 VITALS — BP 130/72 | Temp 98.1°F | Wt 178.0 lb

## 2018-11-19 DIAGNOSIS — R509 Fever, unspecified: Secondary | ICD-10-CM

## 2018-11-19 DIAGNOSIS — R6889 Other general symptoms and signs: Secondary | ICD-10-CM

## 2018-11-19 DIAGNOSIS — R319 Hematuria, unspecified: Secondary | ICD-10-CM

## 2018-11-19 LAB — POCT INFLUENZA A/B
INFLUENZA A, POC: NEGATIVE
Influenza B, POC: NEGATIVE

## 2018-11-19 MED ORDER — OSELTAMIVIR PHOSPHATE 75 MG PO CAPS: 75.0000 mg | ORAL_CAPSULE | Freq: Two times a day (BID) | ORAL | 0 refills | Status: AC

## 2018-11-19 MED ORDER — BENZONATATE 200 MG PO CAPS: 200.0000 mg | ORAL_CAPSULE | Freq: Three times a day (TID) | ORAL | 0 refills | Status: AC | PRN

## 2018-11-19 MED FILL — BENZONATATE 200 MG CAP: 200 | 10 days supply | Qty: 30 | Fill #0

## 2018-11-19 MED FILL — OSELTAMIVIR PHOSPHATE 75 MG: 75 | 5 days supply | Qty: 10 | Fill #0

## 2018-11-19 NOTE — Progress Notes (Signed)
Subjective:  Larry [name] is a 57 y.o. male who presents for evaluation of influenza like symptoms.  Symptoms include achiness, cough described as nonproductive, fever: fevers up to 100.2 degrees, nasal congestion, non productive cough, sore throat and headache.  Onset of symptoms was 1 day ago, and has been unchanged since that time.  Treatment to date:  ibuprofen.  High risk factors for influenza complications:  co-morbid illness, history of HTN and hyperlipidemia.    Patient also informs when he woke up this morning he did have an episode of hematuria with some burning.  Patient states that he has urinated since that time and has not had any other issues.  The following portions of the patient's history were reviewed and updated as appropriate:  allergies, current medications and past medical history.  Constitutional: positive for anorexia, chills, fatigue, fevers and malaise, negative for sweats and weight loss Eyes: negative Ears, nose, mouth, throat, and face: positive for nasal congestion and sore throat, negative for ear drainage, earaches and hoarseness Respiratory: positive for cough, negative for asthma, chronic bronchitis, dyspnea on exertion, sputum, stridor and wheezing Cardiovascular: negative Gastrointestinal: positive for decreased appetite, negative for abdominal pain, change in bowel habits, constipation, nausea and vomiting Genitourinary:positive for dysuria and hematuria, negative for decreased stream, frequency, hesitancy and nocturia Neurological: negative Objective:  BP 130/72 (BP Location: Right Arm, Patient Position: Sitting)   Temp 98.1 F (36.7 C) (Oral)   Wt 178 lb (80.7 kg)   SpO2 98%   BMI 23.48 kg/m  General appearance: alert, cooperative, fatigued and no distress Head: Normocephalic, without obvious abnormality, atraumatic Eyes: conjunctivae/corneas clear. PERRL, EOM's intact. Fundi benign. Ears: normal TM's and external ear canals both ears Nose:  Nares normal. Septum midline. Mucosa normal. No drainage or sinus tenderness. Throat: lips, mucosa, and tongue normal; teeth and gums normal Lungs: clear to auscultation bilaterally Heart: regular rate and rhythm, S1, S2 normal, no murmur, click, rub or gallop Abdomen: soft, non-tender; bowel sounds normal; no masses,  no organomegaly Pulses: 2+ and symmetric Skin: Skin color, texture, turgor normal. No rashes or lesions Lymph nodes: cervical and submandibular nodes normal Neurologic: Grossly normal    Assessment:  influenza like syndrome    Plan:   Exam findings, diagnosis etiology and medication use and indications reviewed with patient. Follow- Up and discharge instructions provided. No emergent/urgent issues found on exam.  Patient symptoms are consistent with influenza-like symptoms.  Discussed with patient the options of continuing symptomatic treatment or adding Tamiflu to his current medication regimen.  Patient opted to have Tamiflu.  Will prescribe Tamiflu for 5 days along with tessalon perles for cough.  Regarding patient's complaints of hematuria, informed patient that he will need to follow-up with his PCP as this office does not treat male urinary symptoms.  Patient verbalized understanding and was in agreement with this treatment plan.  Patient education was provided. Patient verbalized understanding of information provided and agrees with plan of care (POC), all questions answered. The patient is advised to call or return to clinic if condition does not see an improvement in symptoms, or to seek the care of the closest emergency department if condition worsens with the above plan.   1. Fever, unspecified fever cause  - POCT Influenza A/B-negative  2. Influenza-like symptoms  - oseltamivir (TAMIFLU) 75 MG capsule; Take 1 capsule (75 mg total) by mouth 2 (two) times daily for 5 days.  Dispense: 10 capsule; Refill: 0 - benzonatate (TESSALON) 200 MG capsule; Take 1  capsule (200  mg total) by mouth 3 (three) times daily as needed for up to 10 days for cough.  Dispense: 30 capsule; Refill: 0 1. Fever, unspecified fever cause  - POCT Influenza A/B-negative  2. Influenza-like symptoms  - oseltamivir (TAMIFLU) 75 MG capsule; Take 1 capsule (75 mg total) by mouth 2 (two) times daily for 5 days.  Dispense: 10 capsule; Refill: 0 - benzonatate (TESSALON) 200 MG capsule; Take 1 capsule (200 mg total) by mouth 3 (three) times daily as needed for up to 10 days for cough.  Dispense: 30 capsule; Refill: 0 -Take medication as prescribed. -Ibuprofen or Tylenol for pain, fever, or general discomfort. -Increase fluids. -Sleep elevated on at least 2 pillows at bedtime to help with cough. -Use a humidifier or vaporizer when at home and during sleep to help with cough. -May use a normal saline nasal spray to help with nasal congestion -May use a teaspoon of honey or over-the-counter cough drops to help with cough. -Follow-up if symptoms do not improve. Follow up with your PCP regarding your urinary symptoms within the next 3-5 days.   3. Hematuria, unspecified type  -Follow up with PCP -Patient education was provided.

## 2018-11-19 NOTE — Patient Instructions (Addendum)
Influenza-Like Symptoms, Adult -Take medication as prescribed. -Ibuprofen or Tylenol for pain, fever, or general discomfort. -Increase fluids. -Sleep elevated on at least 2 pillows at bedtime to help with cough. -Use a humidifier or vaporizer when at home and during sleep to help with cough. -May use a normal saline nasal spray to help with nasal congestion -May use a teaspoon of honey or over-the-counter cough drops to help with cough. -Follow-up if symptoms do not improve. Follow up with your PCP regarding your urinary symptoms within the next 3-5 days.   Influenza, more commonly known as "the flu," is a viral infection that mainly affects the respiratory tract. The respiratory tract includes organs that help you breathe, such as the lungs, nose, and throat. The flu causes many symptoms similar to the common cold along with high fever and body aches. The flu spreads easily from person to person (is contagious). Getting a flu shot (influenza vaccination) every year is the best way to prevent the flu. What are the causes? This condition is caused by the influenza virus. You can get the virus by:  Breathing in droplets that are in the air from an infected person's cough or sneeze.  Touching something that has been exposed to the virus (has been contaminated) and then touching your mouth, nose, or eyes. What increases the risk? The following factors may make you more likely to get the flu:  Not washing or sanitizing your hands often.  Having close contact with many people during cold and flu season.  Touching your mouth, eyes, or nose without first washing or sanitizing your hands.  Not getting a yearly (annual) flu shot. You may have a higher risk for the flu, including serious problems such as a lung infection (pneumonia), if you:  Are older than 65.  Are pregnant.  Have a weakened disease-fighting system (immune system). You may have a weakened immune system if you: ? Have HIV or  AIDS. ? Are undergoing chemotherapy. ? Are taking medicines that reduce (suppress) the activity of your immune system.  Have a long-term (chronic) illness, such as heart disease, kidney disease, diabetes, or lung disease.  Have a liver disorder.  Are severely overweight (morbidly obese).  Have anemia. This is a condition that affects your red blood cells.  Have asthma. What are the signs or symptoms? Symptoms of this condition usually begin suddenly and last 4-14 days. They may include:  Fever and chills.  Headaches, body aches, or muscle aches.  Sore throat.  Cough.  Runny or stuffy (congested) nose.  Chest discomfort.  Poor appetite.  Weakness or fatigue.  Dizziness.  Nausea or vomiting. How is this diagnosed? This condition may be diagnosed based on:  Your symptoms and medical history.  A physical exam.  Swabbing your nose or throat and testing the fluid for the influenza virus. How is this treated? If the flu is diagnosed early, you can be treated with medicine that can help reduce how severe the illness is and how long it lasts (antiviral medicine). This may be given by mouth (orally) or through an IV. Taking care of yourself at home can help relieve symptoms. Your health care provider may recommend:  Taking over-the-counter medicines.  Drinking plenty of fluids. In many cases, the flu goes away on its own. If you have severe symptoms or complications, you may be treated in a hospital. Follow these instructions at home: Activity  Rest as needed and get plenty of sleep.  Stay home from work or  school as told by your health care provider. Unless you are visiting your health care provider, avoid leaving home until your fever has been gone for 24 hours without taking medicine. Eating and drinking  Take an oral rehydration solution (ORS). This is a drink that is sold at pharmacies and retail stores.  Drink enough fluid to keep your urine pale  yellow.  Drink clear fluids in small amounts as you are able. Clear fluids include water, ice chips, diluted fruit juice, and low-calorie sports drinks.  Eat bland, easy-to-digest foods in small amounts as you are able. These foods include bananas, applesauce, rice, lean meats, toast, and crackers.  Avoid drinking fluids that contain a lot of sugar or caffeine, such as energy drinks, regular sports drinks, and soda.  Avoid alcohol.  Avoid spicy or fatty foods. General instructions      Take over-the-counter and prescription medicines only as told by your health care provider.  Use a cool mist humidifier to add humidity to the air in your home. This can make it easier to breathe.  Cover your mouth and nose when you cough or sneeze.  Wash your hands with soap and water often, especially after you cough or sneeze. If soap and water are not available, use alcohol-based hand sanitizer.  Keep all follow-up visits as told by your health care provider. This is important. How is this prevented?   Get an annual flu shot. You may get the flu shot in late summer, fall, or winter. Ask your health care provider when you should get your flu shot.  Avoid contact with people who are sick during cold and flu season. This is generally fall and winter. Contact a health care provider if:  You develop new symptoms.  You have: ? Chest pain. ? Diarrhea. ? A fever.  Your cough gets worse.  You produce more mucus.  You feel nauseous or you vomit. Get help right away if:  You develop shortness of breath or difficulty breathing.  Your skin or nails turn a bluish color.  You have severe pain or stiffness in your neck.  You develop a sudden headache or sudden pain in your face or ear.  You cannot eat or drink without vomiting. Summary  Influenza, more commonly known as "the flu," is a viral infection that primarily affects your respiratory tract.  Symptoms of the flu usually begin  suddenly and last 4-14 days.  Getting an annual flu shot is the best way to prevent getting the flu.  Stay home from work or school as told by your health care provider. Unless you are visiting your health care provider, avoid leaving home until your fever has been gone for 24 hours without taking medicine.  Keep all follow-up visits as told by your health care provider. This is important. This information is not intended to replace advice given to you by your health care provider. Make sure you discuss any questions you have with your health care provider. Document Released: 11/10/2000 Document Revised: 05/01/2018 Document Reviewed: 05/01/2018 Elsevier Interactive Patient Education  2019 [employer]  Hematuria, Adult Hematuria is blood in the urine. Blood may be visible in the urine, or it may be identified with a test. This condition can be caused by infections of the bladder, urethra, kidney, or prostate. Other possible causes include:  Kidney stones.  Cancer of the urinary tract.  Too much calcium in the urine.  Conditions that are passed from parent to child (inherited conditions).  Exercise that  requires a lot of energy. Infections can usually be treated with medicine, and a kidney stone usually will pass through your urine. If neither of these is the cause of your hematuria, more tests may be needed to identify the cause of your symptoms. It is very important to tell your health care provider about any blood in your urine, even if it is painless or the blood stops without treatment. Blood in the urine, when it happens and then stops and then happens again, can be a symptom of a very serious condition, including cancer. There is no pain in the initial stages of many urinary cancers. Follow these instructions at home: Medicines  Take over-the-counter and prescription medicines only as told by your health care provider.  If you were prescribed an antibiotic medicine, take it as  told by your health care provider. Do not stop taking the antibiotic even if you start to feel better. Eating and drinking  Drink enough fluid to keep your urine clear or pale yellow. It is recommended that you drink 3-4 quarts (2.8-3.8 L) a day. If you have been diagnosed with an infection, it is recommended that you drink cranberry juice in addition to large amounts of water.  Avoid caffeine, tea, and carbonated beverages. These tend to irritate the bladder.  Avoid alcohol because it may irritate the prostate (men). General instructions  If you have been diagnosed with a kidney stone, follow your health care provider's instructions about straining your urine to catch the stone.  Empty your bladder often. Avoid holding urine for long periods of time.  If you are male: ? After a bowel movement, wipe from front to back and use each piece of toilet paper only once. ? Empty your bladder before and after sex.  Pay attention to any changes in your symptoms. Tell your health care provider about any changes or any new symptoms.  It is your responsibility to get your test results. Ask your health care provider, or the department performing the test, when your results will be ready.  Keep all follow-up visits as told by your health care provider. This is important. Contact a health care provider if:  You develop back pain.  You have a fever.  You have nausea or vomiting.  Your symptoms do not improve after 3 days.  Your symptoms get worse. Get help right away if:  You develop severe vomiting and are unable take medicine without vomiting.  You develop severe pain in your back or abdomen even though you are taking medicine.  You pass a large amount of blood in your urine.  You pass blood clots in your urine.  You feel very weak or like you might faint.  You faint. Summary  Hematuria is blood in the urine. It has many possible causes.  It is very important that you tell your  health care provider about any blood in your urine, even if it is painless or the blood stops without treatment.  Take over-the-counter and prescription medicines only as told by your health care provider.  Drink enough fluid to keep your urine clear or pale yellow. This information is not intended to replace advice given to you by your health care provider. Make sure you discuss any questions you have with your health care provider. Document Released: 11/13/2005 Document Revised: 12/16/2016 Document Reviewed: 12/16/2016 Elsevier Interactive Patient Education  2019 [employer].

## 2018-11-20 ENCOUNTER — Encounter (HOSPITAL_BASED_OUTPATIENT_CLINIC_OR_DEPARTMENT_OTHER): Payer: Self-pay | Admitting: Emergency Medicine

## 2018-11-20 ENCOUNTER — Other Ambulatory Visit: Payer: Self-pay

## 2018-11-20 ENCOUNTER — Emergency Department (HOSPITAL_BASED_OUTPATIENT_CLINIC_OR_DEPARTMENT_OTHER)
Admission: EM | Admit: 2018-11-20 | Discharge: 2018-11-20 | Disposition: A | Discharge status: Home / Self Care | Payer: 59 | Attending: Emergency Medicine | Admitting: Emergency Medicine

## 2018-11-20 DIAGNOSIS — R310 Gross hematuria: Secondary | ICD-10-CM | POA: Diagnosis not present

## 2018-11-20 DIAGNOSIS — R319 Hematuria, unspecified: Secondary | ICD-10-CM | POA: Diagnosis present

## 2018-11-20 DIAGNOSIS — Z79899 Other long term (current) drug therapy: Secondary | ICD-10-CM | POA: Diagnosis not present

## 2018-11-20 DIAGNOSIS — I10 Essential (primary) hypertension: Secondary | ICD-10-CM | POA: Insufficient documentation

## 2018-11-20 DIAGNOSIS — R509 Fever, unspecified: Secondary | ICD-10-CM | POA: Diagnosis not present

## 2018-11-20 HISTORY — DX: Pure hypercholesterolemia, unspecified: E78.00

## 2018-11-20 HISTORY — DX: Essential (primary) hypertension: I10

## 2018-11-20 LAB — CBC WITH DIFFERENTIAL/PLATELET
Abs Immature Granulocytes: 0.01 10*3/uL (ref 0.00–0.07)
Basophils Absolute: 0 10*3/uL (ref 0.0–0.1)
Basophils Relative: 0 %
Eosinophils Absolute: 0 10*3/uL (ref 0.0–0.5)
Eosinophils Relative: 1 %
HCT: 44.4 % (ref 39.0–52.0)
Hemoglobin: 13.9 g/dL (ref 13.0–17.0)
Immature Granulocytes: 0 %
Lymphocytes Relative: 42 %
Lymphs Abs: 1.8 10*3/uL (ref 0.7–4.0)
MCH: 30 pg (ref 26.0–34.0)
MCHC: 31.3 g/dL (ref 30.0–36.0)
MCV: 95.9 fL (ref 80.0–100.0)
Monocytes Absolute: 1 10*3/uL (ref 0.1–1.0)
Monocytes Relative: 23 %
Neutro Abs: 1.5 10*3/uL — ABNORMAL LOW (ref 1.7–7.7)
Neutrophils Relative %: 34 %
Platelets: 183 10*3/uL (ref 150–400)
RBC: 4.63 MIL/uL (ref 4.22–5.81)
RDW: 13.4 % (ref 11.5–15.5)
WBC: 4.3 10*3/uL (ref 4.0–10.5)
nRBC: 0 % (ref 0.0–0.2)

## 2018-11-20 LAB — URINALYSIS, ROUTINE W REFLEX MICROSCOPIC
Bilirubin Urine: NEGATIVE
GLUCOSE, UA: NEGATIVE mg/dL
KETONES UR: 15 mg/dL — AB
LEUKOCYTES UA: NEGATIVE
NITRITE: NEGATIVE
PH: 6 (ref 5.0–8.0)
Protein, ur: NEGATIVE mg/dL
SPECIFIC GRAVITY, URINE: 1.02 (ref 1.005–1.030)

## 2018-11-20 LAB — BASIC METABOLIC PANEL
Anion gap: 4 — ABNORMAL LOW (ref 5–15)
BUN: 12 mg/dL (ref 6–20)
CO2: 29 mmol/L (ref 22–32)
Calcium: 9.1 mg/dL (ref 8.9–10.3)
Chloride: 104 mmol/L (ref 98–111)
Creatinine, Ser: 1.07 mg/dL (ref 0.61–1.24)
GFR calc Af Amer: >60 mL/min (ref >60)
GFR calc non Af Amer: >60 mL/min (ref >60)
Glucose, Bld: 87 mg/dL (ref 70–99)
Potassium: 4.4 mmol/L (ref 3.5–5.1)
Sodium: 137 mmol/L (ref 135–145)

## 2018-11-20 LAB — URINALYSIS, MICROSCOPIC (REFLEX)

## 2018-11-20 LAB — CK: Total CK: 376 U/L (ref 49–397)

## 2018-11-20 NOTE — ED Triage Notes (Signed)
Has seen blood in urine twice in past 24  Hours. Two days ago had chills and low grade fever.

## 2018-11-20 NOTE — Discharge Instructions (Signed)
Please follow-up with the urologist for further evaluation and treatment of the blood in your urine.  Make sure to stay well-hydrated with water.  Please return emergency department you develop any new or worsening symptoms including pain, recurrent fever, or any other concerning symptoms.

## 2018-11-20 NOTE — ED Provider Notes (Signed)
[facility] Provider Note   CSN: [id number] Arrival date & time: 11/20/18  1102     History   Chief Complaint Chief Complaint  Patient presents with  . Hematuria    HPI Larry [name] is a 57 y.o. male with history of hypertension, hyperlipidemia presents with a 2-day history of hematuria.  Patient also reports cough and sore throat that began 3 days ago.  He had fever and chills the first day of the cough, however the fever has not persisted.  He reports he went to [facility] yesterday and tested negative for flu, however was given Tamiflu and Tessalon.  They reported he could not be treated for hematuria and was advised to come to the emergency department if it continued.  Patient continues to see red urine.  He denies any clots.  He denies any dysuria or frequency.  He reports he works out a lot and has some soreness in his back and abdomen from this, but denies any other abdominal pain, back pain, nausea, vomiting.  He denies any scrotal pain or swelling.  He denies any concern for STD exposure.  HPI  Past Medical History:  Diagnosis Date  . High cholesterol   . Hyperlipidemia   . Hypertension     Patient Active Problem List   Diagnosis Date Noted  . Nonallopathic lesion of thoracic region 07/09/2018  . Nonallopathic lesion of cervical region 07/09/2018  . Nonallopathic lesion of lumbar region 07/09/2018  . Nonallopathic lesion of sacral region 07/09/2018  . Right rotator cuff tendinitis 04/12/2017  . Right lumbar radiculitis 09/29/2014  . Foot pain, left 09/08/2014  . HYPERLIPIDEMIA 10/09/2010    Past Surgical History:  Procedure Laterality Date  . APPENDECTOMY          Home Medications    Prior to Admission medications   Medication Sig Start Date End Date Taking? Authorizing Provider  benzonatate (TESSALON) 200 MG capsule Take 1 capsule (200 mg total) by mouth 3 (three) times daily as needed for up to 10 days for cough.  11/19/18 11/29/18 Yes Kara Dies, NP  hydrochlorothiazide (MICROZIDE) 12.5 MG capsule Take 12.5 mg by mouth daily.   Yes [provider]  minocycline (MINOCIN,DYNACIN) 50 MG capsule Take 50 mg by mouth 2 (two) times daily.   Yes [provider]  oseltamivir (TAMIFLU) 75 MG capsule Take 1 capsule (75 mg total) by mouth 2 (two) times daily for 5 days. 11/19/18 11/24/18 Yes Kara Dies, NP  candesartan-hydrochlorothiazide (ATACAND HCT) 32-12.5 MG tablet Take 1 tablet by mouth daily.    [provider]  cyclobenzaprine (FLEXERIL) 10 MG tablet One half tab PO qHS, then increase gradually to one tab TID. 09/17/17   Silverio Decamp, MD  HYDROcodone-acetaminophen (NORCO/VICODIN) 5-325 MG tablet Take 1 tablet by mouth every 8 (eight) hours as needed for moderate pain. Patient not taking: Reported on 11/19/2018 09/17/17   Silverio Decamp, MD  lisinopril (PRINIVIL,ZESTRIL) 10 MG tablet Take 10 mg by mouth daily.    [provider]  predniSONE (DELTASONE) 50 MG tablet Take 1 tablet (50 mg total) by mouth daily. Patient not taking: Reported on 11/19/2018 06/27/18   Lyndal Pulley, DO  simvastatin (ZOCOR) 20 MG tablet Take 20 mg by mouth daily.    [provider]    Family History Family History  Problem Relation Age of Onset  . Hyperlipidemia Mother   . Hyperlipidemia Father     Social History Social History  Tobacco Use  . Smoking status: Never Smoker  . Smokeless tobacco: Never Used  Substance Use Topics  . Alcohol use: Yes    Comment: 2 q wk, socially  . Drug use: No     Allergies   Patient has no known allergies.   Review of Systems Review of Systems  Constitutional: Positive for chills (resolved) and fever (resolved).  HENT: Positive for sore throat. Negative for facial swelling.   Respiratory: Positive for cough. Negative for shortness of breath.   Cardiovascular: Negative for chest pain.    Gastrointestinal: Negative for abdominal pain, nausea and vomiting.  Genitourinary: Positive for hematuria. Negative for dysuria.  Musculoskeletal: Negative for back pain.  Skin: Negative for rash and wound.  Neurological: Negative for headaches.  Psychiatric/Behavioral: The patient is not nervous/anxious.      Physical Exam Updated Vital Signs BP 117/83 (BP Location: Right Arm)   Pulse 78   Temp 98.5 F (36.9 C) (Oral)   Resp 16   Ht 6\' 1"  (1.854 m)   Wt 78.9 kg   SpO2 99%   BMI 22.96 kg/m   Physical Exam Vitals signs and nursing note reviewed.  Constitutional:      General: He is not in acute distress.    Appearance: He is well-developed. He is not diaphoretic.  HENT:     Head: Normocephalic and atraumatic.     Mouth/Throat:     Pharynx: No oropharyngeal exudate or posterior oropharyngeal erythema.  Eyes:     General: No scleral icterus.       Right eye: No discharge.        Left eye: No discharge.     Conjunctiva/sclera: Conjunctivae normal.     Pupils: Pupils are equal, round, and reactive to light.  Neck:     Musculoskeletal: Normal range of motion and neck supple.     Thyroid: No thyromegaly.  Cardiovascular:     Rate and Rhythm: Normal rate and regular rhythm.     Heart sounds: Normal heart sounds. No murmur. No friction rub. No gallop.   Pulmonary:     Effort: Pulmonary effort is normal. No respiratory distress.     Breath sounds: Normal breath sounds. No stridor. No wheezing or rales.  Abdominal:     General: Bowel sounds are normal. There is no distension.     Palpations: Abdomen is soft.     Tenderness: There is no abdominal tenderness. There is no guarding or rebound.     Comments: Patient reports mild muscle soreness type pain in the epigastrium  Musculoskeletal:     Comments: Mild tenderness to bilateral lumbar paraspinal muscles that patient describes as muscle soreness from working out  Lymphadenopathy:     Cervical: No cervical adenopathy.   Skin:    General: Skin is warm and dry.     Coloration: Skin is not pale.     Findings: No rash.  Neurological:     Mental Status: He is alert.     Coordination: Coordination normal.      ED Treatments / Results  Labs (all labs ordered are listed, but only abnormal results are displayed) Labs Reviewed  URINALYSIS, ROUTINE W REFLEX MICROSCOPIC - Abnormal; Notable for the following components:      Result Value   Hgb urine dipstick MODERATE (*)    Ketones, ur 15 (*)    All other components within normal limits  BASIC METABOLIC PANEL - Abnormal; Notable for the following components:   Anion gap 4 (*)  All other components within normal limits  CBC WITH DIFFERENTIAL/PLATELET - Abnormal; Notable for the following components:   Neutro Abs 1.5 (*)    All other components within normal limits  URINALYSIS, MICROSCOPIC (REFLEX) - Abnormal; Notable for the following components:   Bacteria, UA RARE (*)    All other components within normal limits  CK    EKG None  Radiology No results found.  Procedures Procedures (including critical care time)  Medications Ordered in ED Medications - No data to display   Initial Impression / Assessment and Plan / ED Course  I have reviewed the triage vital signs and the nursing notes.  Pertinent labs & imaging results that were available during my care of the patient were reviewed by me and considered in my medical decision making (see chart for details).     Patient presenting with painless hematuria.  He has no significant flank pain.  CBC, BMP unremarkable.  CK is within normal limits.  UA shows moderate hematuria, 15 ketones, 6-10 RBCs, and rare bacteria.  Patient will be referred to urology for further work-up.  Patient advised to continue Tessalon as needed for cough.  Lungs are clear, no indication for chest x-ray at this time.  Considering no fever or body aches and negative flu swab, patient advised he could probably stop taking  Tamiflu.  Return precautions discussed.  Patient understands and agrees with plan.  Patient vitals stable throughout ED course and discharged in satisfactory condition. I discussed patient case with Dr. Winfred Leeds who guided the patient's management and agrees with plan.   Final Clinical Impressions(s) / ED Diagnoses   Final diagnoses:  Gross hematuria    ED Discharge Orders    None       Frederica Kuster, PA-C 11/20/18 1542    Orlie Dakin, MD 11/20/18 1622

## 2018-11-20 NOTE — ED Notes (Signed)
Burning sensation on urination.

## 2018-12-09 MED FILL — SIMVASTATIN 20 MG TABLET: 20 | 90 days supply | Qty: 90 | Fill #3

## 2018-12-12 DIAGNOSIS — R310 Gross hematuria: Secondary | ICD-10-CM | POA: Diagnosis not present

## 2018-12-24 DIAGNOSIS — R310 Gross hematuria: Secondary | ICD-10-CM | POA: Diagnosis not present

## 2018-12-25 MED FILL — HYDROCHLOROTHIAZIDE 12.5 MG: 12.5 | 90 days supply | Qty: 90 | Fill #3

## 2018-12-25 MED FILL — LISINOPRIL 10 MG TABLET: 10 | 90 days supply | Qty: 90 | Fill #3

## 2018-12-26 DIAGNOSIS — R310 Gross hematuria: Secondary | ICD-10-CM | POA: Diagnosis not present

## 2018-12-31 DIAGNOSIS — L821 Other seborrheic keratosis: Secondary | ICD-10-CM | POA: Diagnosis not present

## 2019-01-02 ENCOUNTER — Encounter: Payer: Self-pay | Admitting: Family Medicine

## 2019-01-02 ENCOUNTER — Ambulatory Visit (INDEPENDENT_AMBULATORY_CARE_PROVIDER_SITE_OTHER): Payer: Commercial Managed Care - PPO | Admitting: Family Medicine

## 2019-01-02 VITALS — BP 116/70 | HR 86 | Ht 73.0 in | Wt 176.0 lb

## 2019-01-02 DIAGNOSIS — M999 Biomechanical lesion, unspecified: Secondary | ICD-10-CM

## 2019-01-02 DIAGNOSIS — M5416 Radiculopathy, lumbar region: Secondary | ICD-10-CM | POA: Diagnosis not present

## 2019-01-02 NOTE — Assessment & Plan Note (Signed)
No radicular symptoms.  Doing very well.  Very minimal changes on exam today.  Patient will continue with conservative therapy and follow-up in 3 to 4 months for further evaluation and treatment.

## 2019-01-02 NOTE — Patient Instructions (Signed)
Good to see you  Ice is your friend I am impressed  Keep it up  See me again in 3-4 months

## 2019-01-02 NOTE — Assessment & Plan Note (Signed)
Decision today to treat with OMT was based on Physical Exam  After verbal consent patient was treated with HVLA, ME, FPR techniques in cervical, thoracic, lumbar and sacral areas  Patient tolerated the procedure well with improvement in symptoms  Patient given exercises, stretches and lifestyle modifications  See medications in patient instructions if given  Patient will follow up in 4 weeks 

## 2019-01-02 NOTE — Progress Notes (Signed)
[name] [place] [address] [place], [place] [address] Phone: [phone] Subjective:    I [name] am serving as a Education administrator for Dr. Hulan Saas.   CC: Back pain follow-up  [id number]      Updated 01/02/2019  Larry [name] is a 58 y.o. male coming in with complaint of back pain. States that he is doing well.  Patient has had pain for a little bit off and on but nothing severe.  Will get on and regular basis.  Continues with iron supplementation.  Feeling good overall.  Not have any thing that stops him from activities.  In 3 months since previous manipulation       Past Medical History:  Diagnosis Date  . High cholesterol   . Hyperlipidemia   . Hypertension    Past Surgical History:  Procedure Laterality Date  . APPENDECTOMY     Social History   Socioeconomic History  . Marital status: Married    Spouse name: Not on file  . Number of children: Not on file  . Years of education: Not on file  . Highest education level: Not on file  Occupational History  . Not on file  Social Needs  . Financial resource strain: Not on file  . Food insecurity:    Worry: Not on file    Inability: Not on file  . Transportation needs:    Medical: Not on file    Non-medical: Not on file  Tobacco Use  . Smoking status: Never Smoker  . Smokeless tobacco: Never Used  Substance and Sexual Activity  . Alcohol use: Yes    Comment: 2 q wk, socially  . Drug use: No  . Sexual activity: Not on file  Lifestyle  . Physical activity:    Days per week: Not on file    Minutes per session: Not on file  . Stress: Not on file  Relationships  . Social connections:    Talks on phone: Not on file    Gets together: Not on file    Attends religious service: Not on file    Active member of club or organization: Not on file    Attends meetings of clubs or organizations: Not on file    Relationship status: Not on file  Other Topics Concern  . Not on file   Social History Narrative  . Not on file   No Known Allergies Family History  Problem Relation Age of Onset  . Hyperlipidemia Mother   . Hyperlipidemia Father     Current Outpatient Medications (Endocrine & Metabolic):  .  predniSONE (DELTASONE) 50 MG tablet, Take 1 tablet (50 mg total) by mouth daily.  Current Outpatient Medications (Cardiovascular):  .  candesartan-hydrochlorothiazide (ATACAND HCT) 32-12.5 MG tablet, Take 1 tablet by mouth daily. .  hydrochlorothiazide (MICROZIDE) 12.5 MG capsule, Take 12.5 mg by mouth daily. Marland Kitchen  lisinopril (PRINIVIL,ZESTRIL) 10 MG tablet, Take 10 mg by mouth daily. .  simvastatin (ZOCOR) 20 MG tablet, Take 20 mg by mouth daily.   Current Outpatient Medications (Analgesics):  .  HYDROcodone-acetaminophen (NORCO/VICODIN) 5-325 MG tablet, Take 1 tablet by mouth every 8 (eight) hours as needed for moderate pain.   Current Outpatient Medications (Other):  .  cyclobenzaprine (FLEXERIL) 10 MG tablet, One half tab PO qHS, then increase gradually to one tab TID. Marland Kitchen  minocycline (MINOCIN,DYNACIN) 50 MG capsule, Take 50 mg by mouth 2 (two) times daily.    Past medical history, social, surgical  and family history all reviewed in electronic medical record.  No pertanent information unless stated regarding to the chief complaint.   Review of Systems:  No headache, visual changes, nausea, vomiting, diarrhea, constipation, dizziness, abdominal pain, skin rash, fevers, chills, night sweats, weight loss, swollen lymph nodes, body aches, joint swelling,  chest pain, shortness of breath, mood changes.  Positive muscle aches  Objective  Blood pressure 116/70, pulse 86, height 6\' 1"  (1.854 m), weight 176 lb (79.8 kg), SpO2 97 %. Systems examined below as of    General: No apparent distress alert and oriented x3 mood and affect normal, dressed appropriately.  HEENT: Pupils equal, extraocular movements intact  Respiratory: Patient's speak in full sentences and  does not appear short of breath  Cardiovascular: No lower extremity edema, non tender, no erythema  Skin: Warm dry intact with no signs of infection or rash on extremities or on axial skeleton.  Abdomen: Soft nontender  Neuro: Cranial nerves II through XII are intact, neurovascularly intact in all extremities with 2+ DTRs and 2+ pulses.  Lymph: No lymphadenopathy of posterior or anterior cervical chain or axillae bilaterally.  Gait normal with good balance and coordination.  MSK:  Non tender with full range of motion and good stability and symmetric strength and tone of shoulders, elbows, wrist, hip, knee and ankles bilaterally.  Back Exam:  Inspection: Very mild loss of lordosis Motion: Flexion 45 deg, Extension 25 deg, Side Bending to35 deg bilaterally,  Rotation to 45 deg bilaterally  SLR laying: Negative  XSLR laying: Negative  Palpable tenderness: Very mild tenderness in the right sacroiliac joint. FABER: negative. Sensory change: Gross sensation intact to all lumbar and sacral dermatomes.  Reflexes: 2+ at both patellar tendons, 2+ at achilles tendons, Babinski's downgoing.  Strength at foot  Plantar-flexion: 5/5 Dorsi-flexion: 5/5 Eversion: 5/5 Inversion: 5/5  Leg strength  Quad: 5/5 Hamstring: 5/5 Hip flexor: 5/5 Hip abductors: 5/5  Gait unremarkable.  Osteopathic findings C2 flexed rotated and side bent right C6 flexed rotated and side bent left T3 extended rotated and side bent right inhaled third rib T5 extended rotated and side bent left L2 flexed rotated and side bent right Sacrum right on right     Impression and Recommendations:     This case required medical decision making of moderate complexity. The above documentation has been reviewed and is accurate and complete Lyndal Pulley, DO       Note: This dictation was prepared with Dragon dictation along with smaller phrase technology. Any transcriptional errors that result from this process are unintentional.

## 2019-02-06 DIAGNOSIS — N486 Induration penis plastica: Secondary | ICD-10-CM | POA: Diagnosis not present

## 2019-03-31 MED FILL — SIMVASTATIN 20 MG TABLET: 20 | 90 days supply | Qty: 90 | Fill #0

## 2019-04-03 DIAGNOSIS — N486 Induration penis plastica: Secondary | ICD-10-CM | POA: Diagnosis not present

## 2019-04-04 DIAGNOSIS — N486 Induration penis plastica: Secondary | ICD-10-CM | POA: Diagnosis not present

## 2019-04-08 DIAGNOSIS — N486 Induration penis plastica: Secondary | ICD-10-CM | POA: Diagnosis not present

## 2019-04-22 DIAGNOSIS — L723 Sebaceous cyst: Secondary | ICD-10-CM | POA: Diagnosis not present

## 2019-04-22 MED FILL — LISINOPRIL 10 MG TABLET: 10 | 90 days supply | Qty: 90 | Fill #0

## 2019-04-22 MED FILL — HYDROCHLOROTHIAZIDE 12.5 MG: 12.5 | 90 days supply | Qty: 90 | Fill #0

## 2019-04-28 ENCOUNTER — Encounter: Payer: Self-pay | Admitting: Family Medicine

## 2019-04-28 ENCOUNTER — Other Ambulatory Visit: Payer: Self-pay

## 2019-04-28 ENCOUNTER — Ambulatory Visit (INDEPENDENT_AMBULATORY_CARE_PROVIDER_SITE_OTHER): Payer: 59 | Admitting: Family Medicine

## 2019-04-28 VITALS — BP 136/72 | HR 89 | Ht 73.0 in | Wt 179.0 lb

## 2019-04-28 DIAGNOSIS — M999 Biomechanical lesion, unspecified: Secondary | ICD-10-CM | POA: Diagnosis not present

## 2019-04-28 DIAGNOSIS — M5416 Radiculopathy, lumbar region: Secondary | ICD-10-CM | POA: Diagnosis not present

## 2019-04-28 MED ORDER — DICLOFENAC SODIUM 2 % TD SOLN: 2.0000 g | Freq: Two times a day (BID) | TRANSDERMAL | 3 refills | Status: DC

## 2019-04-28 NOTE — Progress Notes (Signed)
[name] [address] [place], [place] [address] Phone: [phone] Subjective:    I'm seeing this patient by the request  of:    CC: Back pain follow-up  [id number]  Larry [name] is a 58 y.o. male coming in with complaint of back pain. States he is doing well and has no complaints.  Patient has been doing very well.  Last seen 3 months ago.  Responded well to manipulation.  Has been working out on a fairly regular basis at this time.  Denies radiation down the leg, pain never stops him from activity.  Does not keep him up at night.  Not taking any pain medication.    Past Medical History:  Diagnosis Date  . High cholesterol   . Hyperlipidemia   . Hypertension    Past Surgical History:  Procedure Laterality Date  . APPENDECTOMY     Social History   Socioeconomic History  . Marital status: Married    Spouse name: Not on file  . Number of children: Not on file  . Years of education: Not on file  . Highest education level: Not on file  Occupational History  . Not on file  Social Needs  . Financial resource strain: Not on file  . Food insecurity:    Worry: Not on file    Inability: Not on file  . Transportation needs:    Medical: Not on file    Non-medical: Not on file  Tobacco Use  . Smoking status: Never Smoker  . Smokeless tobacco: Never Used  Substance and Sexual Activity  . Alcohol use: Yes    Comment: 2 q wk, socially  . Drug use: No  . Sexual activity: Not on file  Lifestyle  . Physical activity:    Days per week: Not on file    Minutes per session: Not on file  . Stress: Not on file  Relationships  . Social connections:    Talks on phone: Not on file    Gets together: Not on file    Attends religious service: Not on file    Active member of club or organization: Not on file    Attends meetings of clubs or organizations: Not on file    Relationship status: Not on file  Other Topics Concern  . Not on file   Social History Narrative  . Not on file   No Known Allergies Family History  Problem Relation Age of Onset  . Hyperlipidemia Mother   . Hyperlipidemia Father     Current Outpatient Medications (Endocrine & Metabolic):  .  predniSONE (DELTASONE) 50 MG tablet, Take 1 tablet (50 mg total) by mouth daily.  Current Outpatient Medications (Cardiovascular):  .  candesartan-hydrochlorothiazide (ATACAND HCT) 32-12.5 MG tablet, Take 1 tablet by mouth daily. .  hydrochlorothiazide (MICROZIDE) 12.5 MG capsule, Take 12.5 mg by mouth daily. Marland Kitchen  lisinopril (PRINIVIL,ZESTRIL) 10 MG tablet, Take 10 mg by mouth daily. .  simvastatin (ZOCOR) 20 MG tablet, Take 20 mg by mouth daily.   Current Outpatient Medications (Analgesics):  .  HYDROcodone-acetaminophen (NORCO/VICODIN) 5-325 MG tablet, Take 1 tablet by mouth every 8 (eight) hours as needed for moderate pain.   Current Outpatient Medications (Other):  .  cyclobenzaprine (FLEXERIL) 10 MG tablet, One half tab PO qHS, then increase gradually to one tab TID. Marland Kitchen  minocycline (MINOCIN,DYNACIN) 50 MG capsule, Take 50 mg by mouth 2 (two) times daily. .  Diclofenac Sodium (PENNSAID) 2 % SOLN,  Place 2 g onto the skin 2 (two) times daily.    Past medical history, social, surgical and family history all reviewed in electronic medical record.  No pertanent information unless stated regarding to the chief complaint.   Review of Systems:  No headache, visual changes, nausea, vomiting, diarrhea, constipation, dizziness, abdominal pain, skin rash, fevers, chills, night sweats, weight loss, swollen lymph nodes, body aches, joint swelling,  chest pain, shortness of breath, mood changes.  Positive muscle aches  Objective  Blood pressure 136/72, pulse 89, height 6\' 1"  (1.854 m), weight 179 lb (81.2 kg), SpO2 98 %.    General: No apparent distress alert and oriented x3 mood and affect normal, dressed appropriately.  HEENT: Pupils equal, extraocular movements  intact  Respiratory: Patient's speak in full sentences and does not appear short of breath  Cardiovascular: No lower extremity edema, non tender, no erythema  Skin: Warm dry intact with no signs of infection or rash on extremities or on axial skeleton.  Abdomen: Soft nontender  Neuro: Cranial nerves II through XII are intact, neurovascularly intact in all extremities with 2+ DTRs and 2+ pulses.  Lymph: No lymphadenopathy of posterior or anterior cervical chain or axillae bilaterally.  Gait normal with good balance and coordination.  MSK:  Non tender with full range of motion and good stability and symmetric strength and tone of shoulders, elbows, wrist, hip, knee and ankles bilaterally.  Back Exam:  Inspection: Unremarkable  Motion: Flexion 40 deg, Extension 20 deg, Side Bending to 35 deg bilaterally,  Rotation to 45 deg bilaterally  SLR laying: Negative  XSLR laying: Negative  Palpable tenderness: To palpation in paraspinal musculature lumbar spine right greater than left. FABER: Is bilaterally. Sensory change: Gross sensation intact to all lumbar and sacral dermatomes.  Reflexes: 2+ at both patellar tendons, 2+ at achilles tendons, Babinski's downgoing.  Strength at foot  Plantar-flexion: 5/5 Dorsi-flexion: 5/5 Eversion: 5/5 Inversion: 5/5  Leg strength  Quad: 5/5 Hamstring: 5/5 Hip flexor: 5/5 Hip abductors: 5/5  Gait unremarkable.  Osteopathic findings  C2 flexed rotated and side bent right C6 flexed rotated and side bent left T9 extended rotated and side bent left L2 flexed rotated and side bent right Sacrum right on right    Impression and Recommendations:     This case required medical decision making of moderate complexity. The above documentation has been reviewed and is accurate and complete Lyndal Pulley, DO       Note: This dictation was prepared with Dragon dictation along with smaller phrase technology. Any transcriptional errors that result from this  process are unintentional.

## 2019-04-28 NOTE — Patient Instructions (Signed)
Finger tip size amount of Pennsaid 2 times daily as needed Follow up in 3-4 months

## 2019-04-28 NOTE — Assessment & Plan Note (Signed)
Decision today to treat with OMT was based on Physical Exam  After verbal consent patient was treated with HVLA, ME, FPR techniques in cervical, thoracic, lumbar and sacral areas  Patient tolerated the procedure well with improvement in symptoms  Patient given exercises, stretches and lifestyle modifications  See medications in patient instructions if given  Patient will follow up in 1-2 weeks 

## 2019-04-28 NOTE — Assessment & Plan Note (Signed)
Likely patient is no longer having any radicular symptoms.  Patient has done well with conservative therapy.  No longer taking the prednisone.  Has not had an exacerbation in quite some time.  Responding well to the osteopathic manipulation.  Discussed posture and ergonomics on a more regular basis.  Follow-up again in 3 to 4 months

## 2019-05-15 DIAGNOSIS — N486 Induration penis plastica: Secondary | ICD-10-CM | POA: Diagnosis not present

## 2019-05-16 DIAGNOSIS — N486 Induration penis plastica: Secondary | ICD-10-CM | POA: Diagnosis not present

## 2019-05-19 DIAGNOSIS — N486 Induration penis plastica: Secondary | ICD-10-CM | POA: Diagnosis not present

## 2019-07-10 DIAGNOSIS — N486 Induration penis plastica: Secondary | ICD-10-CM | POA: Diagnosis not present

## 2019-07-11 DIAGNOSIS — N486 Induration penis plastica: Secondary | ICD-10-CM | POA: Diagnosis not present

## 2019-07-12 MED FILL — SIMVASTATIN 20 MG TABLET: 20 | 90 days supply | Qty: 90 | Fill #1

## 2019-07-28 DIAGNOSIS — H6123 Impacted cerumen, bilateral: Secondary | ICD-10-CM | POA: Diagnosis not present

## 2019-07-28 NOTE — Progress Notes (Signed)
[name] [address] [place], [place] [address] Phone: [phone] Subjective:   I Larry [name] am serving as a Education administrator for Dr. Hulan Saas.   CC: Low back pain follow-up  [id number]  Larry [name] is a 58 y.o. male coming in with complaint of back pain. States he is doing ok. Felt some discomfort about 3 weeks ago. Muscle spasm on the right side lower back.  Patient states that this is the tightness is been quite some time.  Has been doing more though with a workout at home.     Past Medical History:  Diagnosis Date  . High cholesterol   . Hyperlipidemia   . Hypertension    Past Surgical History:  Procedure Laterality Date  . APPENDECTOMY     Social History   Socioeconomic History  . Marital status: Married    Spouse name: Not on file  . Number of children: Not on file  . Years of education: Not on file  . Highest education level: Not on file  Occupational History  . Not on file  Social Needs  . Financial resource strain: Not on file  . Food insecurity    Worry: Not on file    Inability: Not on file  . Transportation needs    Medical: Not on file    Non-medical: Not on file  Tobacco Use  . Smoking status: Never Smoker  . Smokeless tobacco: Never Used  Substance and Sexual Activity  . Alcohol use: Yes    Comment: 2 q wk, socially  . Drug use: No  . Sexual activity: Not on file  Lifestyle  . Physical activity    Days per week: Not on file    Minutes per session: Not on file  . Stress: Not on file  Relationships  . Social Herbalist on phone: Not on file    Gets together: Not on file    Attends religious service: Not on file    Active member of club or organization: Not on file    Attends meetings of clubs or organizations: Not on file    Relationship status: Not on file  Other Topics Concern  . Not on file  Social History Narrative  . Not on file   No Known Allergies Family History  Problem  Relation Age of Onset  . Hyperlipidemia Mother   . Hyperlipidemia Father     Current Outpatient Medications (Endocrine & Metabolic):  .  predniSONE (DELTASONE) 50 MG tablet, Take 1 tablet (50 mg total) by mouth daily.  Current Outpatient Medications (Cardiovascular):  .  candesartan-hydrochlorothiazide (ATACAND HCT) 32-12.5 MG tablet, Take 1 tablet by mouth daily. .  hydrochlorothiazide (MICROZIDE) 12.5 MG capsule, Take 12.5 mg by mouth daily. Marland Kitchen  lisinopril (PRINIVIL,ZESTRIL) 10 MG tablet, Take 10 mg by mouth daily. .  simvastatin (ZOCOR) 20 MG tablet, Take 20 mg by mouth daily.   Current Outpatient Medications (Analgesics):  .  HYDROcodone-acetaminophen (NORCO/VICODIN) 5-325 MG tablet, Take 1 tablet by mouth every 8 (eight) hours as needed for moderate pain. .  meloxicam (MOBIC) 7.5 MG tablet, Take 1 tablet (7.5 mg total) by mouth daily.   Current Outpatient Medications (Other):  .  cyclobenzaprine (FLEXERIL) 10 MG tablet, One half tab PO qHS, then increase gradually to one tab TID. Marland Kitchen  Diclofenac Sodium (PENNSAID) 2 % SOLN, Place 2 g onto the skin 2 (two) times daily. .  minocycline (MINOCIN,DYNACIN) 50 MG capsule, Take 50  mg by mouth 2 (two) times daily. Marland Kitchen  tiZANidine (ZANAFLEX) 4 MG capsule, 1 tablet at night    Past medical history, social, surgical and family history all reviewed in electronic medical record.  No pertanent information unless stated regarding to the chief complaint.   Review of Systems:  No headache, visual changes, nausea, vomiting, diarrhea, constipation, dizziness, abdominal pain, skin rash, fevers, chills, night sweats, weight loss, swollen lymph nodes, body aches, joint swelling, muscle aches, chest pain, shortness of breath, mood changes.   Objective  Blood pressure 130/70, pulse 81, height 6\' 1"  (1.854 m), weight 179 lb (81.2 kg), SpO2 99 %.    General: No apparent distress alert and oriented x3 mood and affect normal, dressed appropriately.  HEENT:  Pupils equal, extraocular movements intact  Respiratory: Patient's speak in full sentences and does not appear short of breath  Cardiovascular: No lower extremity edema, non tender, no erythema  Skin: Warm dry intact with no signs of infection or rash on extremities or on axial skeleton.  Abdomen: Soft nontender  Neuro: Cranial nerves II through XII are intact, neurovascularly intact in all extremities with 2+ DTRs and 2+ pulses.  Lymph: No lymphadenopathy of posterior or anterior cervical chain or axillae bilaterally.  Gait normal with good balance and coordination.  MSK:  Non tender with full range of motion and good stability and symmetric strength and tone of shoulders, elbows, wrist, hip, knee and ankles bilaterally.  Back Exam:  Inspection: Unremarkable  Motion: Flexion 45 deg, Extension 45 deg, Side Bending to 45 deg bilaterally,  Rotation to 45 deg bilaterally  SLR laying: Negative  XSLR laying: Negative  Palpable tenderness: None. FABER: negative. Sensory change: Gross sensation intact to all lumbar and sacral dermatomes.  Reflexes: 2+ at both patellar tendons, 2+ at achilles tendons, Babinski's downgoing.  Strength at foot  Plantar-flexion: 5/5 Dorsi-flexion: 5/5 Eversion: 5/5 Inversion: 5/5  Leg strength  Quad: 5/5 Hamstring: 5/5 Hip flexor: 5/5 Hip abductors: 5/5  Gait unremarkable.  Osteopathic findings  C4 flexed rotated and side bent left C7 flexed rotated and side bent left T3 extended rotated and side bent right inhaled third rib T9 extended rotated and side bent left L2 flexed rotated and side bent right Sacrum right on right    Impression and Recommendations:     This case required medical decision making of moderate complexity. The above documentation has been reviewed and is accurate and complete Larry Pulley, DO       Note: This dictation was prepared with Dragon dictation along with smaller phrase technology. Any transcriptional errors that result  from this process are unintentional.

## 2019-07-29 ENCOUNTER — Encounter: Payer: Self-pay | Admitting: Family Medicine

## 2019-07-29 ENCOUNTER — Ambulatory Visit (INDEPENDENT_AMBULATORY_CARE_PROVIDER_SITE_OTHER): Payer: 59 | Admitting: Family Medicine

## 2019-07-29 VITALS — BP 130/70 | HR 81 | Ht 73.0 in | Wt 179.0 lb

## 2019-07-29 DIAGNOSIS — M5416 Radiculopathy, lumbar region: Secondary | ICD-10-CM | POA: Diagnosis not present

## 2019-07-29 DIAGNOSIS — M999 Biomechanical lesion, unspecified: Secondary | ICD-10-CM | POA: Diagnosis not present

## 2019-07-29 MED ORDER — MELOXICAM 7.5 MG PO TABS: 7.5000 mg | ORAL_TABLET | Freq: Every day | ORAL | 0 refills | Status: DC

## 2019-07-29 MED ORDER — TIZANIDINE HCL 4 MG PO CAPS: ORAL_CAPSULE | ORAL | 0 refills | Status: DC

## 2019-07-29 MED FILL — MELOXICAM 7.5 MG TABLET: 7.5 | 30 days supply | Qty: 30 | Fill #0

## 2019-07-29 MED FILL — tiZANidine HCL 4 MG TABS: 4 | 30 days supply | Qty: 30 | Fill #0

## 2019-07-29 NOTE — Patient Instructions (Signed)
Meloxicam daily for 5 days and then as needed Zanaflex at night Stretch after workouts  See me again in 5 weeks

## 2019-07-29 NOTE — Assessment & Plan Note (Signed)
Decision today to treat with OMT was based on Physical Exam  After verbal consent patient was treated with HVLA, ME, FPR techniques in cervical, thoracic, lumbar and sacral areas  Patient tolerated the procedure well with improvement in symptoms  Patient given exercises, stretches and lifestyle modifications  See medications in patient instructions if given  Patient will follow up in 4-8 weeks 

## 2019-07-29 NOTE — Assessment & Plan Note (Addendum)
Unlikely patient has not had any more radicular symptoms.  Patient denies having some more tightness recently.  Failed meloxicam discussed with patient about posture and ergonomics, discussed which activities to do which wants to avoid.  Patient is to follow-up again in 4 to 8 weeks

## 2019-07-30 ENCOUNTER — Encounter: Payer: Self-pay | Admitting: Family Medicine

## 2019-07-30 MED ORDER — PREDNISONE 50 MG PO TABS: 50.0000 mg | ORAL_TABLET | Freq: Every day | ORAL | 0 refills | Status: DC

## 2019-07-30 MED FILL — predniSONE 50 MG TABS: 50 | 5 days supply | Qty: 5 | Fill #0

## 2019-08-05 MED FILL — LISINOPRIL 10 MG TABS: 10 | 90 days supply | Qty: 90 | Fill #1

## 2019-08-05 MED FILL — HYDROCHLOROTHIAZIDE 12.5 MG: 12.5 | 90 days supply | Qty: 90 | Fill #1

## 2019-08-26 ENCOUNTER — Ambulatory Visit: Payer: 59 | Admitting: Family Medicine

## 2019-08-26 ENCOUNTER — Encounter: Payer: Self-pay | Admitting: Family Medicine

## 2019-08-26 ENCOUNTER — Other Ambulatory Visit: Payer: Self-pay

## 2019-08-26 DIAGNOSIS — M5416 Radiculopathy, lumbar region: Secondary | ICD-10-CM | POA: Diagnosis not present

## 2019-08-26 NOTE — Patient Instructions (Signed)
Glad you're better See you again in 6 weeks

## 2019-08-26 NOTE — Progress Notes (Signed)
[name] [address] [place], [place] [address] Phone: [phone] Subjective:   I Larry [name] am serving as a Education administrator for Dr. Hulan Saas.   CC: Low back pain follow-up  [id number]  Larry [name] is a 58 y.o. male coming in with complaint of low back pain.  Was seen previously and has responded well to manipulation.  He has had an exacerbation of back pain and after last visit sent in prednisone. Patient states the back is doing well today. States the prednisone did well.  States 100% better.      Past Medical History:  Diagnosis Date  . High cholesterol   . Hyperlipidemia   . Hypertension    Past Surgical History:  Procedure Laterality Date  . APPENDECTOMY     Social History   Socioeconomic History  . Marital status: Married    Spouse name: Not on file  . Number of children: Not on file  . Years of education: Not on file  . Highest education level: Not on file  Occupational History  . Not on file  Social Needs  . Financial resource strain: Not on file  . Food insecurity    Worry: Not on file    Inability: Not on file  . Transportation needs    Medical: Not on file    Non-medical: Not on file  Tobacco Use  . Smoking status: Never Smoker  . Smokeless tobacco: Never Used  Substance and Sexual Activity  . Alcohol use: Yes    Comment: 2 q wk, socially  . Drug use: No  . Sexual activity: Not on file  Lifestyle  . Physical activity    Days per week: Not on file    Minutes per session: Not on file  . Stress: Not on file  Relationships  . Social Herbalist on phone: Not on file    Gets together: Not on file    Attends religious service: Not on file    Active member of club or organization: Not on file    Attends meetings of clubs or organizations: Not on file    Relationship status: Not on file  Other Topics Concern  . Not on file  Social History Narrative  . Not on file   No Known Allergies  Family History  Problem Relation Age of Onset  . Hyperlipidemia Mother   . Hyperlipidemia Father     Current Outpatient Medications (Endocrine & Metabolic):  .  predniSONE (DELTASONE) 50 MG tablet, Take 1 tablet (50 mg total) by mouth daily.  Current Outpatient Medications (Cardiovascular):  .  candesartan-hydrochlorothiazide (ATACAND HCT) 32-12.5 MG tablet, Take 1 tablet by mouth daily. .  hydrochlorothiazide (MICROZIDE) 12.5 MG capsule, Take 12.5 mg by mouth daily. Marland Kitchen  lisinopril (PRINIVIL,ZESTRIL) 10 MG tablet, Take 10 mg by mouth daily. .  simvastatin (ZOCOR) 20 MG tablet, Take 20 mg by mouth daily.   Current Outpatient Medications (Analgesics):  .  HYDROcodone-acetaminophen (NORCO/VICODIN) 5-325 MG tablet, Take 1 tablet by mouth every 8 (eight) hours as needed for moderate pain. .  meloxicam (MOBIC) 7.5 MG tablet, Take 1 tablet (7.5 mg total) by mouth daily.   Current Outpatient Medications (Other):  .  cyclobenzaprine (FLEXERIL) 10 MG tablet, One half tab PO qHS, then increase gradually to one tab TID. Marland Kitchen  Diclofenac Sodium (PENNSAID) 2 % SOLN, Place 2 g onto the skin 2 (two) times daily. .  minocycline (MINOCIN,DYNACIN) 50 MG capsule, Take  50 mg by mouth 2 (two) times daily. Marland Kitchen  tiZANidine (ZANAFLEX) 4 MG capsule, 1 tablet at night    Past medical history, social, surgical and family history all reviewed in electronic medical record.  No pertanent information unless stated regarding to the chief complaint.   Review of Systems:  No headache, visual changes, nausea, vomiting, diarrhea, constipation, dizziness, abdominal pain, skin rash, fevers, chills, night sweats, weight loss, swollen lymph nodes, body aches, joint swelling, chest pain, shortness of breath, mood changes.  Positive muscle aches  Objective  Blood pressure 114/62, pulse 87, height 6\' 1"  (1.854 m), weight 179 lb (81.2 kg), SpO2 98 %.     General: No apparent distress alert and oriented x3 mood and affect  normal, dressed appropriately.  HEENT: Pupils equal, extraocular movements intact  Respiratory: Patient's speak in full sentences and does not appear short of breath  Cardiovascular: No lower extremity edema, non tender, no erythema  Skin: Warm dry intact with no signs of infection or rash on extremities or on axial skeleton.  Abdomen: Soft nontender  Neuro: Cranial nerves II through XII are intact, neurovascularly intact in all extremities with 2+ DTRs and 2+ pulses.  Lymph: No lymphadenopathy of posterior or anterior cervical chain or axillae bilaterally.  Gait normal with good balance and coordination.  MSK:  Non tender with full range of motion and good stability and symmetric strength and tone of shoulders, elbows, wrist, hip, knee and ankles bilaterally.  Back Exam:  Inspection: Unremarkable  Motion: Flexion 40 deg, Extension 25 deg, Side Bending to 35 deg bilaterally,  Rotation to 40 deg bilaterally  SLR laying: Negative  XSLR laying: Negative  Palpable tenderness: Tender to palpation of paraspinal musculature lumbar spine right greater than left. FABER: negative. Sensory change: Gross sensation intact to all lumbar and sacral dermatomes.  Reflexes: 2+ at both patellar tendons, 2+ at achilles tendons, Babinski's downgoing.  Strength at foot  Plantar-flexion: 5/5 Dorsi-flexion: 5/5 Eversion: 5/5 Inversion: 5/5  Leg strength  Quad: 5/5 Hamstring: 5/5 Hip flexor: 5/5 Hip abductors: 5/5  Gait unremarkable.      Impression and Recommendations:      The above documentation has been reviewed and is accurate and complete Larry Pulley, DO       Note: This dictation was prepared with Dragon dictation along with smaller phrase technology. Any transcriptional errors that result from this process are unintentional.

## 2019-08-26 NOTE — Assessment & Plan Note (Signed)
Significant improvement is noted today.  Hold on anything such as the osteopathic manipulation today.  He did not needed within being seen previously fairly recently.  Follow-up again in 4 to 8 weeks

## 2019-08-28 DIAGNOSIS — N486 Induration penis plastica: Secondary | ICD-10-CM | POA: Diagnosis not present

## 2019-08-29 DIAGNOSIS — N486 Induration penis plastica: Secondary | ICD-10-CM | POA: Diagnosis not present

## 2019-09-24 ENCOUNTER — Encounter: Payer: Self-pay | Admitting: *Deleted

## 2019-10-08 ENCOUNTER — Ambulatory Visit (INDEPENDENT_AMBULATORY_CARE_PROVIDER_SITE_OTHER): Payer: 59 | Admitting: Family Medicine

## 2019-10-08 ENCOUNTER — Encounter: Payer: Self-pay | Admitting: Family Medicine

## 2019-10-08 ENCOUNTER — Other Ambulatory Visit: Payer: Self-pay

## 2019-10-08 VITALS — BP 124/76 | Ht 73.0 in | Wt 181.0 lb

## 2019-10-08 DIAGNOSIS — M999 Biomechanical lesion, unspecified: Secondary | ICD-10-CM

## 2019-10-08 DIAGNOSIS — M5416 Radiculopathy, lumbar region: Secondary | ICD-10-CM | POA: Diagnosis not present

## 2019-10-08 NOTE — Assessment & Plan Note (Signed)
Discussed patient's radicular symptoms.  Discussed home exercises and icing regimen.  Patient is to increase activity slowly over the course the next several weeks.  Follow-up again in 4 to 6 weeks.  Do believe that stress is playing somewhat of a role here.

## 2019-10-08 NOTE — Assessment & Plan Note (Signed)
Decision today to treat with OMT was based on Physical Exam  After verbal consent patient was treated with HVLA, ME, FPR techniques in cervical, thoracic, lumbar and sacral areas  Patient tolerated the procedure well with improvement in symptoms  Patient given exercises, stretches and lifestyle modifications  See medications in patient instructions if given  Patient will follow up in 4-8 weeks 

## 2019-10-08 NOTE — Progress Notes (Signed)
[name] [address], [place] [address] Phone: [phone] Subjective:   Larry [name], am serving as a scribe for Dr. Hulan Saas.    CC: Right-sided back pain  [id number]   08/26/2019 Significant improvement is noted today.  Hold on anything such as the osteopathic manipulation today.  He did not needed within being seen previously fairly recently.  Follow-up again in 4 to 8 weeks  Update 10/08/2019 Larry [name] is a 58 y.o. male coming in with complaint of right sided back pain today. Was sitting in a meeting and had radiating pain down the right leg for 30 seconds. Has not had any radiating symptoms prior to today. Otherwise continues to have intermittent back pain.  Patient states nothing that stops him.  Has not been as active recently. Increase in stress at work.     Past Medical History:  Diagnosis Date   High cholesterol    Hyperlipidemia    Hypertension    Past Surgical History:  Procedure Laterality Date   APPENDECTOMY     Social History   Socioeconomic History   Marital status: Married    Spouse name: Not on file   Number of children: Not on file   Years of education: Not on file   Highest education level: Not on file  Occupational History   Not on file  Social Needs   Financial resource strain: Not on file   Food insecurity    Worry: Not on file    Inability: Not on file   Transportation needs    Medical: Not on file    Non-medical: Not on file  Tobacco Use   Smoking status: Never Smoker   Smokeless tobacco: Never Used  Substance and Sexual Activity   Alcohol use: Yes    Comment: 2 q wk, socially   Drug use: [name]   Sexual activity: Not on file  Lifestyle   Physical activity    Days per week: Not on file    Minutes per session: Not on file   Stress: Not on file  Relationships   Social connections    Talks on phone: Not on file    Gets together: Not on file   Attends religious service: Not on file    Active member of club or organization: Not on file    Attends meetings of clubs or organizations: Not on file    Relationship status: Not on file  Other Topics Concern   Not on file  Social History Narrative   Not on file   [name] Known Allergies Family History  Problem Relation Age of Onset   Hyperlipidemia Mother    Hyperlipidemia Father     Current Outpatient Medications (Endocrine & Metabolic):    predniSONE (DELTASONE) 50 MG tablet, Take 1 tablet (50 mg total) by mouth daily.  Current Outpatient Medications (Cardiovascular):    candesartan-hydrochlorothiazide (ATACAND HCT) 32-12.5 MG tablet, Take 1 tablet by mouth daily.   hydrochlorothiazide (MICROZIDE) 12.5 MG capsule, Take 12.5 mg by mouth daily.   lisinopril (PRINIVIL,ZESTRIL) 10 MG tablet, Take 10 mg by mouth daily.   simvastatin (ZOCOR) 20 MG tablet, Take 20 mg by mouth daily.   Current Outpatient Medications (Analgesics):    HYDROcodone-acetaminophen (NORCO/VICODIN) 5-325 MG tablet, Take 1 tablet by mouth every 8 (eight) hours as needed for moderate pain.   meloxicam (MOBIC) 7.5 MG tablet, Take 1 tablet (7.5 mg total) by mouth daily.   Current Outpatient Medications (Other):  cyclobenzaprine (FLEXERIL) 10 MG tablet, One half tab PO qHS, then increase gradually to one tab TID.   Diclofenac Sodium (PENNSAID) 2 % SOLN, Place 2 g onto the skin 2 (two) times daily.   minocycline (MINOCIN,DYNACIN) 50 MG capsule, Take 50 mg by mouth 2 (two) times daily.   tiZANidine (ZANAFLEX) 4 MG capsule, 1 tablet at night    Past medical history, social, surgical and family history all reviewed in electronic medical record.  [name] pertanent information unless stated regarding to the chief complaint.   Review of Systems:  [name] headache, visual changes, nausea, vomiting, diarrhea, constipation, dizziness, abdominal pain, skin rash, fevers, chills, night sweats, weight loss, swollen  lymph nodes, body aches, joint swelling, muscle aches, chest pain, shortness of breath, mood changes.   Objective  Blood pressure 124/76, height 6\' 1"  (1.854 m), weight 181 lb (82.1 kg). Systems examined below as of    General: [name] apparent distress alert and oriented x3 mood and affect normal, dressed appropriately.  HEENT: Pupils equal, extraocular movements intact  Respiratory: Patient's speak in full sentences and does not appear short of breath  Cardiovascular: [name] lower extremity edema, non tender, [name] erythema  Skin: Warm dry intact with [name] signs of infection or rash on extremities or on axial skeleton.  Abdomen: Soft nontender  Neuro: Cranial nerves II through XII are intact, neurovascularly intact in all extremities with 2+ DTRs and 2+ pulses.  Lymph: [name] lymphadenopathy of posterior or anterior cervical chain or axillae bilaterally.  Gait normal with good balance and coordination.  MSK:  Non tender with full range of motion and good stability and symmetric strength and tone of shoulders, elbows, wrist, hip, knee and ankles bilaterally.  Low back exam shows tightness in the paraspinal musculature lumbar spine right greater than left.  Patient has a negative straight leg test but tightness of the hamstrings.  Mild positive FABER test.  5-5 strength of the lower extremities noted.  Osteopathic findings  C4 flexed rotated and side bent left T9 extended rotated and side bent left L2 flexed rotated and side bent right Sacrum right on right    Impression and Recommendations:     This case required medical decision making of moderate complexity. The above documentation has been reviewed and is accurate and complete Lyndal Pulley, DO       Note: This dictation was prepared with Dragon dictation along with smaller phrase technology. Any transcriptional errors that result from this process are unintentional.

## 2019-11-10 MED FILL — SIMVASTATIN 20 MG TABLET: 20 | 90 days supply | Qty: 90 | Fill #2

## 2019-11-14 DIAGNOSIS — E785 Hyperlipidemia, unspecified: Secondary | ICD-10-CM | POA: Diagnosis not present

## 2019-11-14 DIAGNOSIS — I10 Essential (primary) hypertension: Secondary | ICD-10-CM | POA: Diagnosis not present

## 2019-11-14 DIAGNOSIS — F419 Anxiety disorder, unspecified: Secondary | ICD-10-CM | POA: Diagnosis not present

## 2019-11-14 DIAGNOSIS — Z0000 Encounter for general adult medical examination without abnormal findings: Secondary | ICD-10-CM | POA: Diagnosis not present

## 2019-11-17 NOTE — Progress Notes (Signed)
[name] [address] [place], [place] [address] Phone: [phone] Subjective:      CC: Neck and low back follow-up  [id number]  Larry [name] is a 57 y.o. male coming in with complaint of low back pain.  We have seen patient presents previously.  Patient has had minimal pain at the moment.  With mild tightness recently.  Continues to workout fairly regularly.    Past Medical History:  Diagnosis Date  . High cholesterol   . Hyperlipidemia   . Hypertension    Past Surgical History:  Procedure Laterality Date  . APPENDECTOMY     Social History   Socioeconomic History  . Marital status: Married    Spouse name: Not on file  . Number of children: Not on file  . Years of education: Not on file  . Highest education level: Not on file  Occupational History  . Not on file  Tobacco Use  . Smoking status: Never Smoker  . Smokeless tobacco: Never Used  Substance and Sexual Activity  . Alcohol use: Yes    Comment: 2 q wk, socially  . Drug use: No  . Sexual activity: Not on file  Other Topics Concern  . Not on file  Social History Narrative  . Not on file   Social Determinants of Health   Financial Resource Strain:   . Difficulty of Paying Living Expenses: Not on file  Food Insecurity:   . Worried About Charity fundraiser in the Last Year: Not on file  . Ran Out of Food in the Last Year: Not on file  Transportation Needs:   . Lack of Transportation (Medical): Not on file  . Lack of Transportation (Non-Medical): Not on file  Physical Activity:   . Days of Exercise per Week: Not on file  . Minutes of Exercise per Session: Not on file  Stress:   . Feeling of Stress : Not on file  Social Connections:   . Frequency of Communication with Friends and Family: Not on file  . Frequency of Social Gatherings with Friends and Family: Not on file  . Attends Religious Services: Not on file  . Active Member of Clubs or Organizations:  Not on file  . Attends Archivist Meetings: Not on file  . Marital Status: Not on file   No Known Allergies Family History  Problem Relation Age of Onset  . Hyperlipidemia Mother   . Hyperlipidemia Father     Current Outpatient Medications (Endocrine & Metabolic):  .  predniSONE (DELTASONE) 50 MG tablet, Take 1 tablet (50 mg total) by mouth daily.  Current Outpatient Medications (Cardiovascular):  .  candesartan-hydrochlorothiazide (ATACAND HCT) 32-12.5 MG tablet, Take 1 tablet by mouth daily. .  hydrochlorothiazide (MICROZIDE) 12.5 MG capsule, Take 12.5 mg by mouth daily. Marland Kitchen  lisinopril (PRINIVIL,ZESTRIL) 10 MG tablet, Take 10 mg by mouth daily. .  simvastatin (ZOCOR) 20 MG tablet, Take 20 mg by mouth daily.   Current Outpatient Medications (Analgesics):  .  HYDROcodone-acetaminophen (NORCO/VICODIN) 5-325 MG tablet, Take 1 tablet by mouth every 8 (eight) hours as needed for moderate pain. .  meloxicam (MOBIC) 7.5 MG tablet, Take 1 tablet (7.5 mg total) by mouth daily.   Current Outpatient Medications (Other):  .  cyclobenzaprine (FLEXERIL) 10 MG tablet, One half tab PO qHS, then increase gradually to one tab TID. Marland Kitchen  Diclofenac Sodium (PENNSAID) 2 % SOLN, Place 2 g onto the skin 2 (two) times daily. Marland Kitchen  minocycline (MINOCIN,DYNACIN) 50 MG capsule, Take 50 mg by mouth 2 (two) times daily. Marland Kitchen  tiZANidine (ZANAFLEX) 4 MG capsule, 1 tablet at night    Past medical history, social, surgical and family history all reviewed in electronic medical record.  No pertanent information unless stated regarding to the chief complaint.   Review of Systems:  No headache, visual changes, nausea, vomiting, diarrhea, constipation, dizziness, abdominal pain, skin rash, fevers, chills, night sweats, weight loss, swollen lymph nodes, body aches, joint swelling, muscle aches, chest pain, shortness of breath, mood changes.   Objective  Blood pressure 124/76, pulse 80, height 6\' 1"  (1.854 m),  weight 181 lb (82.1 kg), SpO2 98 %.    General: No apparent distress alert and oriented x3 mood and affect normal, dressed appropriately.  HEENT: Pupils equal, extraocular movements intact  Respiratory: Patient's speak in full sentences and does not appear short of breath  Cardiovascular: No lower extremity edema, non tender, no erythema  Skin: Warm dry intact with no signs of infection or rash on extremities or on axial skeleton.  Abdomen: Soft nontender  Neuro: Cranial nerves II through XII are intact, neurovascularly intact in all extremities with 2+ DTRs and 2+ pulses.  Lymph: No lymphadenopathy of posterior or anterior cervical chain or axillae bilaterally.  Gait normal with good balance and coordination.  MSK:  Non tender with full range of motion and good stability and symmetric strength and tone of shoulders, elbows, wrist, hip, knee and ankles bilaterally.  Back exam does have some loss of lordosis.  Tender to palpation in the paraspinal musculature lumbar spine right greater than left.  5 out of 5 strength of the lower extremity.  Mild tightness more than thoracolumbar junction reported left as well.  Osteopathic findings C2 flexed rotated and side bent right C4 flexed rotated and side bent left T11 extended rotated and side bent right L2 flexed rotated and side bent right Sacrum right on right    Impression and Recommendations:     This case required medical decision making of moderate complexity. The above documentation has been reviewed and is accurate and complete Lyndal Pulley, DO       Note: This dictation was prepared with Dragon dictation along with smaller phrase technology. Any transcriptional errors that result from this process are unintentional.

## 2019-11-18 ENCOUNTER — Encounter: Payer: Self-pay | Admitting: Family Medicine

## 2019-11-18 ENCOUNTER — Ambulatory Visit: Payer: 59 | Admitting: Family Medicine

## 2019-11-18 ENCOUNTER — Other Ambulatory Visit: Payer: Self-pay

## 2019-11-18 VITALS — BP 124/76 | HR 80 | Ht 73.0 in | Wt 181.0 lb

## 2019-11-18 DIAGNOSIS — M999 Biomechanical lesion, unspecified: Secondary | ICD-10-CM | POA: Diagnosis not present

## 2019-11-18 DIAGNOSIS — M5416 Radiculopathy, lumbar region: Secondary | ICD-10-CM | POA: Diagnosis not present

## 2019-11-18 MED FILL — ALPRAZolam 0.5 MG TABS: 0.5 | 8 days supply | Qty: 30 | Fill #0

## 2019-11-18 NOTE — Assessment & Plan Note (Signed)
Decision today to treat with OMT was based on Physical Exam  After verbal consent patient was treated with HVLA, ME, FPR techniques in cervical, thoracic, lumbar and sacral areas  Patient tolerated the procedure well with improvement in symptoms  Patient given exercises, stretches and lifestyle modifications  See medications in patient instructions if given  Patient will follow up in 4-8 weeks 

## 2019-11-18 NOTE — Assessment & Plan Note (Signed)
No lumbar radiculopathy at the moment.  Patient still having some tightness of the lower back and some to be more in the thoracolumbar junction which is little different than usual.  Discussed icing regimen and home exercise, discussed which activities to do which wants to avoid.  Patient is to increase activity slowly over the course the next several weeks.  Follow-up with me again in 4 to 8 weeks

## 2019-11-18 NOTE — Patient Instructions (Signed)
Continue to focus on core See me in 10 weeks Happy Holidays

## 2019-12-04 DIAGNOSIS — N486 Induration penis plastica: Secondary | ICD-10-CM | POA: Diagnosis not present

## 2019-12-06 MED FILL — HYDROCHLOROTHIAZIDE 12.5 MG: 12.5 | 90 days supply | Qty: 90 | Fill #2

## 2019-12-06 MED FILL — LISINOPRIL 10 MG TABS: 10 | 90 days supply | Qty: 90 | Fill #2

## 2020-01-13 ENCOUNTER — Ambulatory Visit: Payer: Self-pay

## 2020-01-13 ENCOUNTER — Ambulatory Visit: Payer: 59 | Admitting: Family Medicine

## 2020-01-13 ENCOUNTER — Other Ambulatory Visit: Payer: Self-pay

## 2020-01-13 ENCOUNTER — Ambulatory Visit (INDEPENDENT_AMBULATORY_CARE_PROVIDER_SITE_OTHER): Payer: 59

## 2020-01-13 ENCOUNTER — Encounter: Payer: Self-pay | Admitting: Family Medicine

## 2020-01-13 VITALS — BP 112/84 | HR 81 | Ht 73.0 in | Wt 176.6 lb

## 2020-01-13 DIAGNOSIS — M25532 Pain in left wrist: Secondary | ICD-10-CM | POA: Diagnosis not present

## 2020-01-13 MED ORDER — PREDNISONE 50 MG PO TABS: 50.0000 mg | ORAL_TABLET | Freq: Every day | ORAL | 0 refills | Status: DC

## 2020-01-13 MED ORDER — HYDROCODONE-ACETAMINOPHEN 5-325 MG PO TABS: 1.0000 | ORAL_TABLET | Freq: Four times a day (QID) | ORAL | 0 refills | Status: DC | PRN

## 2020-01-13 MED FILL — HYDROCODON-APAP 5-325: 5-325 | 3 days supply | Qty: 15 | Fill #0

## 2020-01-13 MED FILL — predniSONE 50 MG TABS: 50 | 5 days supply | Qty: 5 | Fill #0

## 2020-01-13 NOTE — Patient Instructions (Signed)
Thank you for coming in today. Continue the voltaren gel .  Use the brace as needed.  Attend hand therapy.  Take the prednisone daily for 5 days.  OK to reduce the dose or stop it early.  Use hydrocodone sparingly.  If not improving I am happy to proceed to injection at any time. Please keep me updated.

## 2020-01-13 NOTE — Progress Notes (Signed)
I, Wendy Poet, LAT, ATC, am serving as scribe for Dr. Lynne Leader.  Larry [name] is a 59 y.o. male (LHD) who presents to [facility] today for L wrist pain since this morning w/ no known MOI.  He last saw Dr. Tamala Julian on 11/18/19 for low back pain.  He rates his R wrist pain at a 8/10 and describes his pain as aching and sensitive to touch.  He locates his pain to the R radial wrist at the R wrist joint.  Radiating pain: Into the R 1st MC/thumb Mechanical wrist symptoms: No Swelling: No Aggravating factors: General use of L hand which is his dominant hand Treatments tried: Tylenol   Pertinent review of systems: No fever or chills.  No change in activity.  Relevant historical information: History hyperlipidemia   Exam:  BP 112/84 (BP Location: Left Arm, Patient Position: Sitting, Cuff Size: Normal)   Pulse 81   Ht 6\' 1"  (1.854 m)   Wt 176 lb 9.6 oz (80.1 kg)   SpO2 98%   BMI 23.30 kg/m  General: Well Developed, well nourished, and in no acute distress.   MSK:  Left hand and wrist: Slight swelling at the base of the thumb near the thenar eminence and CMC. Tender palpation volar base of thumb and wrist radial side. Tenderness at [facility] near navicular area, and near insertion of flexor carpi radialis. Pain reproduced somewhat with thumb motion especially opposition and with wrist radial deviation. Pulses cap refill and sensation are intact distally.    Lab and Radiology Results  X-ray images left wrist obtained today personally and independently reviewed No severe degenerative changes.  Accessory ossicle versus old avulsion fragment present superficial to scaphoid trapezium joint. Await formal radiology review  Limited musculoskeletal ultrasound left wrist and thumb. Intact flexor carpi radialis tendon at the insertion.  Region with palpation with ultrasound probe. Tender normal-appearing CMC joint mild effusion. Trace calcification seen  at scaphoid trapezium joint.  Impression: Possible old avulsion fragment versus accessory ossicle.  Possible flexor carpi radialis tendinitis    Assessment and Plan: 59 y.o. male with radial wrist pain starting this morning without obvious cause or injury.  Imaging today shows small calcification on both x-ray and ultrasound in the area of pain.  X-ray appears to be old avulsion fragment versus accessory ossicle.  Certainly does not appear to be new.  Not sure if this is actually the cause of pain or not.  Plan for after discussion trial of conservative management with thumb spica wrist brace, Voltaren gel, short course of oral prednisone and referral to hand therapy.  If not improving will proceed with injection.    Orders Placed This Encounter  Procedures  . [place] LIMITED JOINT SPACE STRUCTURES UP LEFT(NO LINKED CHARGES)    Order Specific Question:   Reason for Exam (SYMPTOM  OR DIAGNOSIS REQUIRED)    Answer:   L wrist pain    Order Specific Question:   Preferred imaging location?    Answer:   [place]  . DG Wrist Complete Left    Standing Status:   Future    Standing Expiration Date:   03/12/2021    Order Specific Question:   Reason for Exam (SYMPTOM  OR DIAGNOSIS REQUIRED)    Answer:   eval pain base of thumb/radial wrist    Order Specific Question:   Preferred imaging location?    Answer:   [facility]    Order Specific  Question:   Radiology Contrast Protocol - do NOT remove file path    Answer:   [email or web address]   No orders of the defined types were placed in this encounter.    Discussed warning signs or symptoms. Please see discharge instructions. Patient expresses understanding.   The above documentation has been reviewed and is accurate and complete Lynne Leader

## 2020-01-14 NOTE — Progress Notes (Signed)
Radiology does see that small bony area at the base of your thumb that we talked about in clinic.  They think it probably represents an old injury as well.  No other findings present.

## 2020-01-20 ENCOUNTER — Ambulatory Visit: Payer: 59 | Admitting: Family Medicine

## 2020-02-05 ENCOUNTER — Ambulatory Visit (INDEPENDENT_AMBULATORY_CARE_PROVIDER_SITE_OTHER): Payer: 59 | Admitting: Family Medicine

## 2020-02-05 ENCOUNTER — Other Ambulatory Visit: Payer: Self-pay

## 2020-02-05 ENCOUNTER — Encounter: Payer: Self-pay | Admitting: Family Medicine

## 2020-02-05 VITALS — BP 110/78 | HR 90 | Ht 73.0 in | Wt 175.0 lb

## 2020-02-05 DIAGNOSIS — M999 Biomechanical lesion, unspecified: Secondary | ICD-10-CM

## 2020-02-05 DIAGNOSIS — M5416 Radiculopathy, lumbar region: Secondary | ICD-10-CM | POA: Diagnosis not present

## 2020-02-05 NOTE — Assessment & Plan Note (Signed)
Patient usually has pain on the contralateral side.  This is a chronic problem with maybe some mild exacerbation.  Believe the patient should do relatively well with conservative therapy.  We discussed with patient ergonomics and posture, social determinants of health include that patient is fully vaccinated but at the moment having some increasing in stress secondary to more work issues causing him not to be able to work out quite as regularly.  Encouraged him to do that so.  Patient will increase activity as tolerated.  Follow-up again in 4 to 8 weeks

## 2020-02-05 NOTE — Progress Notes (Signed)
[place] Phone: [phone] Subjective:   Larry [name], am serving as a scribe for Dr. Hulan Saas. This visit occurred during the SARS-CoV-2 public health emergency.  Safety protocols were in place, including screening questions prior to the visit, additional usage of staff PPE, and extensive cleaning of exam room while observing appropriate contact time as indicated for disinfecting solutions.   I'm seeing this patient by the request  of:  Rice, Wayland Denis, MD  CC: Low back pain follow-up  [id number]  Larry [name] is a 59 y.o. male coming in with complaint of back pain. Last seen on 11/18/2019 for OMT. Patient states that his left thigh is bothering him. Also having lower back pain seems to be the same started seems to be a little bit more on the left side than the right side.  Patient states that the thigh pain is independent to the back pain.  States that it is sensitive to even light sensation.  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       Past Medical History:  Diagnosis Date  . High cholesterol   . Hyperlipidemia   . Hypertension    Past Surgical History:  Procedure Laterality Date  . APPENDECTOMY     Social History   Socioeconomic History  . Marital status: Married    Spouse name: Not on file  . Number of children: Not on file  . Years of education: Not on file  . Highest education level: Not on file  Occupational History  . Not on file  Tobacco Use  . Smoking status: Never Smoker  . Smokeless tobacco: Never Used  Substance and Sexual Activity  . Alcohol use: Yes    Comment: 2 q wk, socially  . Drug use: [name]  . Sexual activity: Not on file  Other Topics Concern  . Not on file  Social History Narrative  . Not on file   Social Determinants of Health   Financial Resource Strain:   . Difficulty  of Paying Living Expenses:   Food Insecurity:   . Worried About Charity fundraiser in the Last Year:   . Arboriculturist in the Last Year:   Transportation Needs:   . Film/video editor (Medical):   Marland Kitchen Lack of Transportation (Non-Medical):   Physical Activity:   . Days of Exercise per Week:   . Minutes of Exercise per Session:   Stress:   . Feeling of Stress :   Social Connections:   . Frequency of Communication with Friends and Family:   . Frequency of Social Gatherings with Friends and Family:   . Attends Religious Services:   . Active Member of Clubs or Organizations:   . Attends Archivist Meetings:   Marland Kitchen Marital Status:    [name] Known Allergies Family History  Problem Relation Age of Onset  . Hyperlipidemia Mother   . Hyperlipidemia Father     Current Outpatient Medications (Endocrine & Metabolic):  .  predniSONE (DELTASONE) 50 MG tablet, Take 1 tablet (50 mg total) by mouth daily.  Current Outpatient Medications (Cardiovascular):  .  candesartan-hydrochlorothiazide (ATACAND HCT) 32-12.5 MG tablet, Take 1 tablet by mouth daily. .  hydrochlorothiazide (MICROZIDE) 12.5 MG capsule, Take 12.5 mg by mouth daily. Marland Kitchen  lisinopril (PRINIVIL,ZESTRIL) 10 MG tablet, Take 10 mg by mouth daily. .  simvastatin (  ZOCOR) 20 MG tablet, Take 20 mg by mouth daily.   Current Outpatient Medications (Analgesics):  .  HYDROcodone-acetaminophen (NORCO/VICODIN) 5-325 MG tablet, Take 1 tablet by mouth every 6 (six) hours as needed. .  meloxicam (MOBIC) 7.5 MG tablet, Take 1 tablet (7.5 mg total) by mouth daily.   Current Outpatient Medications (Other):  .  cyclobenzaprine (FLEXERIL) 10 MG tablet, One half tab PO qHS, then increase gradually to one tab TID. Marland Kitchen  Diclofenac Sodium (PENNSAID) 2 % SOLN, Place 2 g onto the skin 2 (two) times daily. .  minocycline (MINOCIN,DYNACIN) 50 MG capsule, Take 50 mg by mouth 2 (two) times daily. Marland Kitchen  tiZANidine (ZANAFLEX) 4 MG capsule, 1 tablet at  night   Reviewed prior external information including notes and imaging from  primary care provider As well as notes that were available from care everywhere and other healthcare systems.  Past medical history, social, surgical and family history all reviewed in electronic medical record.  [name] pertanent information unless stated regarding to the chief complaint.   Review of Systems:  [name] headache, visual changes, nausea, vomiting, diarrhea, constipation, dizziness, abdominal pain, skin rash, fevers, chills, night sweats, weight loss, swollen lymph nodes, body aches, joint swelling, chest pain, shortness of breath, mood changes. POSITIVE muscle aches  Objective  Blood pressure 110/78, pulse 90, height 6\' 1"  (1.854 m), weight 175 lb (79.4 kg), SpO2 98 %.   General: [name] apparent distress alert and oriented x3 mood and affect normal, dressed appropriately.  HEENT: Pupils equal, extraocular movements intact  Respiratory: Patient's speak in full sentences and does not appear short of breath  Cardiovascular: [name] lower extremity edema, non tender, [name] erythema  Skin: Warm dry intact with [name] signs of infection or rash on extremities or on axial skeleton.  Abdomen: Soft nontender  Neuro: Cranial nerves II through XII are intact, neurovascularly intact in all extremities with 2+ DTRs and 2+ pulses.  Lymph: [name] lymphadenopathy of posterior or anterior cervical chain or axillae bilaterally.  Gait normal with good balance and coordination.  MSK:  Non tender with full range of motion and good stability and symmetric strength and tone of shoulders, elbows, wrist, hip, knee and ankles bilaterally.  Low back exam does have some mild loss of lordosis.  Patient does have some mild tightness with Corky Sox test bilaterally.  Left greater than right.  Osteopathic findings C2 flexed rotated and side bent right T3 extended rotated and side bent right inhaled third rib T5 extended rotated and side bent left L2 flexed  rotated and side bent right Sacrum left on left     Impression and Recommendations:     This case required medical decision making of moderate complexity. The above documentation has been reviewed and is accurate and complete Lyndal Pulley, DO       Note: This dictation was prepared with Dragon dictation along with smaller phrase technology. Any transcriptional errors that result from this process are unintentional.

## 2020-02-05 NOTE — Patient Instructions (Signed)
Plan your reward Take time for you See me in 6 weeks just in case

## 2020-02-05 NOTE — Assessment & Plan Note (Signed)
Decision today to treat with OMT was based on Physical Exam  After verbal consent patient was treated with HVLA, ME, FPR techniques in cervical, thoracic, lumbar and sacral areas  Patient tolerated the procedure well with improvement in symptoms  Patient given exercises, stretches and lifestyle modifications  See medications in patient instructions if given  Patient will follow up in 4-8 weeks 

## 2020-03-16 MED FILL — SIMVASTATIN 20 MG TABLET: 20 | 90 days supply | Qty: 90 | Fill #3

## 2020-03-16 MED FILL — LISINOPRIL 10 MG TABS: 10 | 90 days supply | Qty: 90 | Fill #3

## 2020-03-17 NOTE — Progress Notes (Signed)
[place] Phone: [phone] Subjective:   [name], am serving as a scribe for Dr. Hulan Saas. This visit occurred during the SARS-CoV-2 public health emergency.  Safety protocols were in place, including screening questions prior to the visit, additional usage of staff PPE, and extensive cleaning of exam room while observing appropriate contact time as indicated for disinfecting solutions.   I'm seeing this patient by the request  of:  Rice, Wayland Denis, MD  CC: Low back pain follow-up  [id number]  Larry [name] is a 59 y.o. male coming in with complaint of back pain. Last seen on 02/05/2020 for OMT. Patient states overall doing fairly well.  Some mild increase in stress recently.  Patient was finishing up school, promotion at work, and is having 78 birthday.     Past Medical History:  Diagnosis Date  . High cholesterol   . Hyperlipidemia   . Hypertension    Past Surgical History:  Procedure Laterality Date  . APPENDECTOMY     Social History   Socioeconomic History  . Marital status: Married    Spouse name: Not on file  . Number of children: Not on file  . Years of education: Not on file  . Highest education level: Not on file  Occupational History  . Not on file  Tobacco Use  . Smoking status: Never Smoker  . Smokeless tobacco: Never Used  Substance and Sexual Activity  . Alcohol use: Yes    Comment: 2 q wk, socially  . Drug use: No  . Sexual activity: Not on file  Other Topics Concern  . Not on file  Social History Narrative  . Not on file   Social Determinants of Health   Financial Resource Strain:   . Difficulty of Paying Living Expenses:   Food Insecurity:   . Worried About Charity fundraiser in the Last Year:   . Arboriculturist in the Last Year:   Transportation Needs:   . Film/video editor (Medical):   Marland Kitchen Lack of Transportation (Non-Medical):   Physical  Activity:   . Days of Exercise per Week:   . Minutes of Exercise per Session:   Stress:   . Feeling of Stress :   Social Connections:   . Frequency of Communication with Friends and Family:   . Frequency of Social Gatherings with Friends and Family:   . Attends Religious Services:   . Active Member of Clubs or Organizations:   . Attends Archivist Meetings:   Marland Kitchen Marital Status:    No Known Allergies Family History  Problem Relation Age of Onset  . Hyperlipidemia Mother   . Hyperlipidemia Father     Current Outpatient Medications (Endocrine & Metabolic):  .  predniSONE (DELTASONE) 50 MG tablet, Take 1 tablet (50 mg total) by mouth daily.  Current Outpatient Medications (Cardiovascular):  .  candesartan-hydrochlorothiazide (ATACAND HCT) 32-12.5 MG tablet, Take 1 tablet by mouth daily. .  hydrochlorothiazide (MICROZIDE) 12.5 MG capsule, Take 12.5 mg by mouth daily. Marland Kitchen  lisinopril (PRINIVIL,ZESTRIL) 10 MG tablet, Take 10 mg by mouth daily. .  simvastatin (ZOCOR) 20 MG tablet, Take 20 mg by mouth daily.   Current Outpatient Medications (Analgesics):  .  HYDROcodone-acetaminophen (NORCO/VICODIN) 5-325 MG tablet, Take 1 tablet by mouth every 6 (six) hours as needed. .  meloxicam (MOBIC) 7.5 MG tablet, Take 1 tablet (7.5 mg total) by mouth daily.  Current Outpatient Medications (Other):  .  cyclobenzaprine (FLEXERIL) 10 MG tablet, One half tab PO qHS, then increase gradually to one tab TID. Marland Kitchen  Diclofenac Sodium (PENNSAID) 2 % SOLN, Place 2 g onto the skin 2 (two) times daily. .  minocycline (MINOCIN,DYNACIN) 50 MG capsule, Take 50 mg by mouth 2 (two) times daily. Marland Kitchen  tiZANidine (ZANAFLEX) 4 MG capsule, 1 tablet at night   Reviewed prior external information including notes and imaging from  primary care provider As well as notes that were available from care everywhere and other healthcare systems.  Past medical history, social, surgical and family history all reviewed  in electronic medical record.  No pertanent information unless stated regarding to the chief complaint.   Review of Systems:  No headache, visual changes, nausea, vomiting, diarrhea, constipation, dizziness, abdominal pain, skin rash, fevers, chills, night sweats, weight loss, swollen lymph nodes, body aches, joint swelling, chest pain, shortness of breath, mood changes. POSITIVE muscle aches  Objective  There were no vitals taken for this visit.   General: No apparent distress alert and oriented x3 mood and affect normal, dressed appropriately.  HEENT: Pupils equal, extraocular movements intact  Respiratory: Patient's speak in full sentences and does not appear short of breath  Cardiovascular: No lower extremity edema, non tender, no erythema  Neuro: Cranial nerves II through XII are intact, neurovascularly intact in all extremities with 2+ DTRs and 2+ pulses.  Gait normal with good balance and coordination.  MSK:  Non tender with full range of motion and good stability and symmetric strength and tone of shoulders, elbows, wrist, hip, knee and ankles bilaterally.  Back exam shows some tightness noted paraspinal musculature of the lumbar spine.  Tightness more on the right hamstring with straight leg test but no true radicular symptoms noted.  5 out of 5 strength in lower extremities bilaterally.  Tightness with Corky Sox test bilaterally.  Osteopathic findings  C2 flexed rotated and side bent right C5 flexed rotated and side bent left T9 extended rotated and side bent left L4 flexed rotated and side bent left Sacrum right on right    Impression and Recommendations:     This case required medical decision making of moderate complexity. The above documentation has been reviewed and is accurate and complete Lyndal Pulley, DO       Note: This dictation was prepared with Dragon dictation along with smaller phrase technology. Any transcriptional errors that result from this process are  unintentional.

## 2020-03-18 ENCOUNTER — Ambulatory Visit: Payer: 59 | Admitting: Family Medicine

## 2020-03-18 ENCOUNTER — Other Ambulatory Visit: Payer: Self-pay

## 2020-03-18 ENCOUNTER — Encounter: Payer: Self-pay | Admitting: Family Medicine

## 2020-03-18 VITALS — HR 84 | Ht 73.0 in

## 2020-03-18 DIAGNOSIS — M5416 Radiculopathy, lumbar region: Secondary | ICD-10-CM

## 2020-03-18 DIAGNOSIS — M999 Biomechanical lesion, unspecified: Secondary | ICD-10-CM | POA: Diagnosis not present

## 2020-03-18 NOTE — Patient Instructions (Signed)
Good to see you.   See me again in 6-8 week  Keep up the good work you are doing great

## 2020-03-18 NOTE — Assessment & Plan Note (Signed)
Chronic problem but seems to be somewhat stable.  Patient is working more and continues to have some stress.  Responds well to manipulation as well.  Discussed medication management including the Flexeril.  Topical anti-inflammatories also suggested.  Follow-up again in 4 to 8 weeks

## 2020-03-18 NOTE — Assessment & Plan Note (Signed)
   Decision today to treat with OMT was based on Physical Exam  After verbal consent patient was treated with HVLA, ME, FPR techniques in cervical, thoracic,  lumbar and sacral areas, all areas are chronic   Patient tolerated the procedure well with improvement in symptoms  Patient given exercises, stretches and lifestyle modifications  See medications in patient instructions if given  Patient will follow up in 4-8 weeks 

## 2020-03-25 MED FILL — HYDROCHLOROTHIAZIDE 12.5 MG: 12.5 | 90 days supply | Qty: 90 | Fill #3

## 2020-05-11 ENCOUNTER — Ambulatory Visit: Payer: 59 | Admitting: Family Medicine

## 2020-05-11 ENCOUNTER — Encounter: Payer: Self-pay | Admitting: Family Medicine

## 2020-05-11 ENCOUNTER — Other Ambulatory Visit: Payer: Self-pay

## 2020-05-11 VITALS — BP 114/72 | HR 80 | Ht 73.0 in | Wt 175.0 lb

## 2020-05-11 DIAGNOSIS — M75102 Unspecified rotator cuff tear or rupture of left shoulder, not specified as traumatic: Secondary | ICD-10-CM | POA: Insufficient documentation

## 2020-05-11 DIAGNOSIS — M999 Biomechanical lesion, unspecified: Secondary | ICD-10-CM

## 2020-05-11 DIAGNOSIS — M75112 Incomplete rotator cuff tear or rupture of left shoulder, not specified as traumatic: Secondary | ICD-10-CM

## 2020-05-11 MED ORDER — TRAZODONE HCL 50 MG PO TABS: 25.0000 mg | ORAL_TABLET | Freq: Every evening | ORAL | 3 refills | Status: DC | PRN

## 2020-05-11 MED ORDER — VITAMIN D (ERGOCALCIFEROL) 1.25 MG (50000 UNIT) PO CAPS: 50000.0000 [IU] | ORAL_CAPSULE | ORAL | 0 refills | Status: DC

## 2020-05-11 MED ORDER — HYDROXYZINE HCL 10 MG PO TABS: 10.0000 mg | ORAL_TABLET | Freq: Three times a day (TID) | ORAL | 0 refills | Status: DC | PRN

## 2020-05-11 MED FILL — VIT D2 1.25 MG (50,000 UNIT: 1.25 MG | 84 days supply | Qty: 12 | Fill #0

## 2020-05-11 MED FILL — hydrOXYzine HCL 10 MG TABS: 10 | 10 days supply | Qty: 30 | Fill #0

## 2020-05-11 MED FILL — traZODone HCL 50 MG TABS: 50 | 30 days supply | Qty: 30 | Fill #0

## 2020-05-11 NOTE — Progress Notes (Signed)
[place] Phone: [phone] Subjective:   Larry [name], am serving as a scribe for Dr. Hulan Saas. This visit occurred during the SARS-CoV-2 public health emergency.  Safety protocols were in place, including screening questions prior to the visit, additional usage of staff PPE, and extensive cleaning of exam room while observing appropriate contact time as indicated for disinfecting solutions.   I'm seeing this patient by the request  of:  Rice, Wayland Denis, MD  CC: left shoulder pain, follow up back pain    [id number]  Larry [name] is a 59 y.o. male coming in with complaint of back and neck pain. OMT 03/22/2020.  Patient states that he is also having left shoulder pain for one month in posterior aspect. Unable to fully abduction and external rotation without pain. Has hard time sleeping on left side. Denies any numbness or tingling.  Patient is left handed.  Medications patient has been prescribed: Topical ointment penicillin for the back some time.          Reviewed prior external information including notes and imaging from previsou exam, outside providers and external EMR if available.   As well as notes that were available from care everywhere and other healthcare systems.  Past medical history, social, surgical and family history all reviewed in electronic medical record.  [name] pertanent information unless stated regarding to the chief complaint.   Past Medical History:  Diagnosis Date  . High cholesterol   . Hyperlipidemia   . Hypertension     [name] Known Allergies   Review of Systems:  [name] headache, visual changes, nausea, vomiting, diarrhea, constipation, dizziness, abdominal pain, skin rash, fevers, chills, night sweats, weight loss, swollen lymph nodes, body aches, joint swelling, chest pain, shortness of breath, mood changes. POSITIVE muscle aches patient has had some increasing  fatigue  Objective  Blood pressure 114/72, pulse 80, height 6\' 1"  (1.854 m), weight 175 lb (79.4 kg), SpO2 99 %.   General: [name] apparent distress alert and oriented x3 mood and affect normal, dressed appropriately.  HEENT: Pupils equal, extraocular movements intact  Respiratory: Patient's speak in full sentences and does not appear short of breath  Cardiovascular: [name] lower extremity edema, non tender, [name] erythema  Neuro: Cranial nerves II through XII are intact, neurovascularly intact in all extremities with 2+ DTRs and 2+ pulses.  Gait normal with good balance and coordination.  Left shoulder exam shows the patient does have a positive impingement noted.  5 out of 5 strength of rotator cuff the pain with empty can.  Positive impingement with Hawkins but not as much with Neer's.  Negative crossover test.  Negative O'Brien's Back -back exam mild loss of lordosis.  Tender to palpation in the paraspinal musculature of the lumbar spine right greater than left.  Negative straight leg test, mild increase in tightness in the thoracolumbar junction than patient's baseline.  Osteopathic findings  C2 flexed rotated and side bent right C6 flexed rotated and side bent left T7 extended rotated and side bent left L2 flexed rotated and side bent right Sacrum right on right        Assessment and Plan:    Nonallopathic problems  Decision today to treat with OMT was based on Physical Exam  After verbal consent patient was treated with HVLA, ME, FPR techniques in cervical, rib, thoracic, lumbar, and sacral  areas  Patient tolerated the procedure well with improvement in symptoms  Patient  given exercises, stretches and lifestyle modifications  See medications in patient instructions if given  Patient will follow up in 4-8 weeks      The above documentation has been reviewed and is accurate and complete Lyndal Pulley, DO       Note: This dictation was prepared with Dragon dictation  along with smaller phrase technology. Any transcriptional errors that result from this process are unintentional.

## 2020-05-11 NOTE — Assessment & Plan Note (Signed)
   Decision today to treat with OMT was based on Physical Exam  After verbal consent patient was treated with HVLA, ME, FPR techniques in cervical, thoracic,  lumbar and sacral areas, all areas are chronic   Patient tolerated the procedure well with improvement in symptoms  Patient given exercises, stretches and lifestyle modifications  See medications in patient instructions if given  Patient will follow up in 4-8 weeks 

## 2020-05-11 NOTE — Assessment & Plan Note (Signed)
Incomplete tear nontraumatic it appears.  Patient was doing a lot of lifting at the moment.  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  1 on hospice needs help with transferring.  Calcific changes noted on ultrasound today.  We will monitor closely.  Discussed home exercises.  Patient work with Product/process development scientist.  Worsening symptoms consider injection.  Follow-up again in 4 to 6 weeks

## 2020-05-11 NOTE — Patient Instructions (Addendum)
Once weekly Vit D Pennsaid 2x a day  Trazadone at night Hydroxizine up to 3x a day See me in 4 weeks

## 2020-06-17 ENCOUNTER — Ambulatory Visit: Payer: 59 | Admitting: Family Medicine

## 2020-06-17 ENCOUNTER — Encounter: Payer: Self-pay | Admitting: Family Medicine

## 2020-06-17 ENCOUNTER — Other Ambulatory Visit: Payer: Self-pay

## 2020-06-17 VITALS — BP 144/84 | HR 74 | Ht 73.0 in | Wt 179.0 lb

## 2020-06-17 DIAGNOSIS — M75112 Incomplete rotator cuff tear or rupture of left shoulder, not specified as traumatic: Secondary | ICD-10-CM

## 2020-06-17 DIAGNOSIS — M999 Biomechanical lesion, unspecified: Secondary | ICD-10-CM | POA: Diagnosis not present

## 2020-06-17 DIAGNOSIS — M5416 Radiculopathy, lumbar region: Secondary | ICD-10-CM

## 2020-06-17 NOTE — Progress Notes (Signed)
[place] [address] [place] Phone: [phone] Subjective:   I [name] am serving as a Education administrator for Dr. Hulan Saas.  This visit occurred during the SARS-CoV-2 public health emergency.  Safety protocols were in place, including screening questions prior to the visit, additional usage of staff PPE, and extensive cleaning of exam room while observing appropriate contact time as indicated for disinfecting solutions.   I'm seeing this patient by the request  of:  Rice, Wayland Denis, MD  CC: Shoulder pain, back pain follow-up  [id number]  Larry [name] is a 59 y.o. male coming in with complaint of back pain. Last seen 05/11/2020 for OMT. Patient states he is much better. Still some soreness but ROM has improved.  Patient states that he is approximately 65 to 75% improved.  Patient recently though did have mother-in-law passed away.  Going to a wedding.      Past Medical History:  Diagnosis Date  . High cholesterol   . Hyperlipidemia   . Hypertension    Past Surgical History:  Procedure Laterality Date  . APPENDECTOMY     Social History   Socioeconomic History  . Marital status: Married    Spouse name: Not on file  . Number of children: Not on file  . Years of education: Not on file  . Highest education level: Not on file  Occupational History  . Not on file  Tobacco Use  . Smoking status: Never Smoker  . Smokeless tobacco: Never Used  Substance and Sexual Activity  . Alcohol use: Yes    Comment: 2 q wk, socially  . Drug use: No  . Sexual activity: Not on file  Other Topics Concern  . Not on file  Social History Narrative  . Not on file   Social Determinants of Health   Financial Resource Strain:   . Difficulty of Paying Living Expenses:   Food Insecurity:   . Worried About Charity fundraiser in the Last Year:   . Arboriculturist in the Last Year:   Transportation Needs:   . Lexicographer (Medical):   Marland Kitchen Lack of Transportation (Non-Medical):   Physical Activity:   . Days of Exercise per Week:   . Minutes of Exercise per Session:   Stress:   . Feeling of Stress :   Social Connections:   . Frequency of Communication with Friends and Family:   . Frequency of Social Gatherings with Friends and Family:   . Attends Religious Services:   . Active Member of Clubs or Organizations:   . Attends Archivist Meetings:   Marland Kitchen Marital Status:    No Known Allergies Family History  Problem Relation Age of Onset  . Hyperlipidemia Mother   . Hyperlipidemia Father     Current Outpatient Medications (Endocrine & Metabolic):  .  predniSONE (DELTASONE) 50 MG tablet, Take 1 tablet (50 mg total) by mouth daily.  Current Outpatient Medications (Cardiovascular):  .  candesartan-hydrochlorothiazide (ATACAND HCT) 32-12.5 MG tablet, Take 1 tablet by mouth daily. .  hydrochlorothiazide (MICROZIDE) 12.5 MG capsule, Take 12.5 mg by mouth daily. Marland Kitchen  lisinopril (PRINIVIL,ZESTRIL) 10 MG tablet, Take 10 mg by mouth daily. .  simvastatin (ZOCOR) 20 MG tablet, Take 20 mg by mouth daily.   Current Outpatient Medications (Analgesics):  .  HYDROcodone-acetaminophen (NORCO/VICODIN) 5-325 MG tablet, Take 1 tablet by mouth every 6 (six) hours as needed. .  meloxicam (MOBIC) 7.5  MG tablet, Take 1 tablet (7.5 mg total) by mouth daily.   Current Outpatient Medications (Other):  .  cyclobenzaprine (FLEXERIL) 10 MG tablet, One half tab PO qHS, then increase gradually to one tab TID. Marland Kitchen  Diclofenac Sodium (PENNSAID) 2 % SOLN, Place 2 g onto the skin 2 (two) times daily. .  hydrOXYzine (ATARAX/VISTARIL) 10 MG tablet, Take 1 tablet (10 mg total) by mouth 3 (three) times daily as needed. .  minocycline (MINOCIN,DYNACIN) 50 MG capsule, Take 50 mg by mouth 2 (two) times daily. Marland Kitchen  tiZANidine (ZANAFLEX) 4 MG capsule, 1 tablet at night .  traZODone (DESYREL) 50 MG tablet, Take 0.5-1 tablets (25-50  mg total) by mouth at bedtime as needed for sleep. .  Vitamin D, Ergocalciferol, (DRISDOL) 1.25 MG (50000 UNIT) CAPS capsule, Take 1 capsule (50,000 Units total) by mouth every 7 (seven) days.   Reviewed prior external information including notes and imaging from  primary care provider As well as notes that were available from care everywhere and other healthcare systems.  Past medical history, social, surgical and family history all reviewed in electronic medical record.  No pertanent information unless stated regarding to the chief complaint.   Review of Systems:  No headache, visual changes, nausea, vomiting, diarrhea, constipation, dizziness, abdominal pain, skin rash, fevers, chills, night sweats, weight loss, swollen lymph nodes, body aches, joint swelling, chest pain, shortness of breath, mood changes. POSITIVE muscle aches  Objective  Blood pressure (!) 144/84, pulse 74, height 6\' 1"  (1.854 m), weight 179 lb (81.2 kg), SpO2 97 %.   General: No apparent distress alert and oriented x3 mood and affect normal, dressed appropriately.  HEENT: Pupils equal, extraocular movements intact  Respiratory: Patient's speak in full sentences and does not appear short of breath  Cardiovascular: No lower extremity edema, non tender, no erythema  Neuro: Cranial nerves II through XII are intact, neurovascularly intact in all extremities with 2+ DTRs and 2+ pulses.  Gait normal with good balance and coordination.  MSK:  Left shoulder exam shows the patient does have some mild positive impingement still remaining.  Lacks last 5 degrees of forward flexion in the last 5 degrees of internal rotation.  Back exam some mild loss of lordosis.  Tender to palpation in the paraspinal musculature lumbar spine right greater than left.  Vascularly intact distally.   Osteopathic findings  C2 flexed rotated and side bent right C6 flexed rotated and side bent left T8 extended rotated and side bent left L4 flexed  rotated and side bent left  Sacrum right on right    Impression and Recommendations:     The above documentation has been reviewed and is accurate and complete Lyndal Pulley, DO       Note: This dictation was prepared with Dragon dictation along with smaller phrase technology. Any transcriptional errors that result from this process are unintentional.

## 2020-06-17 NOTE — Assessment & Plan Note (Signed)
Left rotator cuff tear.  Discussed with patient in great length.  Patient is doing better overall.  Patient has had been doing the exercises regularly.  Continue the vitamin D supplementation.  Once again discussed the potential for advanced imaging the size of the tear that was initially but patient is making improvement and does not think it would change medical management.  Patient will follow up with me again in 6 to 7 weeks

## 2020-06-17 NOTE — Assessment & Plan Note (Signed)
   Decision today to treat with OMT was based on Physical Exam  After verbal consent patient was treated with HVLA, ME, FPR techniques in cervical, thoracic,  lumbar and sacral areas, all areas are chronic   Patient tolerated the procedure well with improvement in symptoms  Patient given exercises, stretches and lifestyle modifications  See medications in patient instructions if given  Patient will follow up in 4-8 weeks 

## 2020-06-17 NOTE — Assessment & Plan Note (Signed)
Stable, more tightness, no true radicular symptoms at this time.  Discussed medication management including the naloxone topical anti-inflammatories if needed but use more on the shoulder.  Follow-up again in 6 weeks

## 2020-06-17 NOTE — Patient Instructions (Signed)
Good to see you  Sorry for your loss Have un at the wedding  Shoulder seems to be improving See me again in 6 weeks

## 2020-07-19 ENCOUNTER — Other Ambulatory Visit (HOSPITAL_COMMUNITY): Payer: Self-pay | Admitting: Internal Medicine

## 2020-07-19 MED FILL — LISINOPRIL 10 MG TABS: 10 | 90 days supply | Qty: 90 | Fill #0

## 2020-07-19 MED FILL — HYDROCHLOROTHIAZIDE 12.5 MG: 12.5 | 90 days supply | Qty: 90 | Fill #0

## 2020-07-19 MED FILL — SIMVASTATIN 20 MG TABLET: 20 | 90 days supply | Qty: 90 | Fill #0

## 2020-07-28 NOTE — Progress Notes (Signed)
°  [facility] [address] [place] Phone: [phone] Subjective:   I Larry [name] am serving as a Education administrator for Dr. Hulan Saas.  This visit occurred during the SARS-CoV-2 public health emergency.  Safety protocols were in place, including screening questions prior to the visit, additional usage of staff PPE, and extensive cleaning of exam room while observing appropriate contact time as indicated for disinfecting solutions.   I'm seeing this patient by the request  of:  Rice, Wayland Denis, MD  CC: Back pain, left shoulder pain  [id number]  Larry [name] is a 59 y.o. male coming in with complaint of back and neck pain.OMT 06/17/2020. Patient states he is doing better. Shoulder ROM on the left side is limited and there is intermitted pain in the posterior shoulder.  States that it seems to be getting worse at the moment.  Patient was given an injection previously but has not made significant improvement.         Reviewed prior external information including notes and imaging from previsou exam, outside providers and external EMR if available.   As well as notes that were available from care everywhere and other healthcare systems.  Past medical history, social, surgical and family history all reviewed in electronic medical record.  No pertanent information unless stated regarding to the chief complaint.   Past Medical History:  Diagnosis Date   High cholesterol    Hyperlipidemia    Hypertension     No Known Allergies   Review of Systems:  No headache, visual changes, nausea, vomiting, diarrhea, constipation, dizziness, abdominal pain, skin rash, fevers, chills, night sweats, weight loss, swollen lymph nodes, body aches, joint swelling, chest pain, shortness of breath, mood changes. POSITIVE muscle aches  Objective  Blood pressure 132/80, pulse 70, height 6\' 1"  (1.854 m), weight 177 lb (80.3 kg), SpO2 98 %.   General:  No apparent distress alert and oriented x3 mood and affect normal, dressed appropriately.  HEENT: Pupils equal, extraocular movements intact  Respiratory: Patient's speak in full sentences and does not appear short of breath  Cardiovascular: No lower extremity edema, non tender, no erythema  Neuro: Cranial nerves II through XII are intact, neurovascularly intact in all extremities with 2+ DTRs and 2+ pulses.  Gait normal with good balance and coordination.  MSK: Left shoulder exam shows the patient does have decreased range of motion in all planes.  Positive impingement noted.  Rotator cuff strength 4+ out of 5 compared to the contralateral side.  Positive crossover and positive O'Brien's.  Limited musculoskeletal ultrasound was performed and interpreted by Lyndal Pulley  Limited ultrasound of patient's left shoulder shows the patient's rotator cuff tear does have some degenerative changes but no significant retraction noted of the supraspinatus.  Patient though does have either some early arthritic changes that is different than previous exam with some cortical thinning noted.  Patient also has some calcific changes of the Posterior labrum noted.  Trace effusion of the joint noted as well.     Assessment and Plan:         The above documentation has been reviewed and is accurate and complete Lyndal Pulley, DO       Note: This dictation was prepared with Dragon dictation along with smaller phrase technology. Any transcriptional errors that result from this process are unintentional.

## 2020-07-29 ENCOUNTER — Encounter: Payer: Self-pay | Admitting: Family Medicine

## 2020-07-29 ENCOUNTER — Other Ambulatory Visit: Payer: Self-pay

## 2020-07-29 ENCOUNTER — Ambulatory Visit (INDEPENDENT_AMBULATORY_CARE_PROVIDER_SITE_OTHER): Payer: 59

## 2020-07-29 ENCOUNTER — Ambulatory Visit: Payer: 59 | Admitting: Family Medicine

## 2020-07-29 ENCOUNTER — Ambulatory Visit: Payer: Self-pay

## 2020-07-29 VITALS — BP 132/80 | HR 70 | Ht 73.0 in | Wt 177.0 lb

## 2020-07-29 DIAGNOSIS — M75112 Incomplete rotator cuff tear or rupture of left shoulder, not specified as traumatic: Secondary | ICD-10-CM

## 2020-07-29 DIAGNOSIS — M2550 Pain in unspecified joint: Secondary | ICD-10-CM

## 2020-07-29 DIAGNOSIS — M19012 Primary osteoarthritis, left shoulder: Secondary | ICD-10-CM | POA: Diagnosis not present

## 2020-07-29 DIAGNOSIS — G8929 Other chronic pain: Secondary | ICD-10-CM

## 2020-07-29 DIAGNOSIS — M25512 Pain in left shoulder: Secondary | ICD-10-CM

## 2020-07-29 MED ORDER — DIAZEPAM 5 MG PO TABS
ORAL_TABLET | ORAL | 0 refills | Status: DC
Start: 2020-07-29 — End: 2021-10-12

## 2020-07-29 MED FILL — diazePAM 5 MG TABS: 5 | 1 days supply | Qty: 2 | Fill #0

## 2020-07-29 NOTE — Assessment & Plan Note (Signed)
Patient has significant decrease in range of motion of the shoulder at the moment.  Ultrasound does show some calcific changes.  Family history of rheumatoid arthritis and laboratory work-up ordered.  Patient is also secondary to the changes in the labrum I do think an MR arthrogram would be beneficial.  We do see some healing of the rotator cuff noted but I do think once again advanced imaging would be warranted and patient would be a surgical candidate if necessary.  Follow-up again after imaging

## 2020-07-29 NOTE — Patient Instructions (Addendum)
Good to see you Labs today MRA shoulder See me again in 6 weeks

## 2020-07-30 LAB — TESTOSTERONE TOTAL,FREE,BIO, MALES
Albumin: 4.6 g/dL (ref 3.6–5.1)
Sex Hormone Binding: 24 nmol/L (ref 22–77)
Testosterone, Bioavailable: 125.1 ng/dL (ref >=110.0)
Testosterone, Free: 59.6 pg/mL (ref 46.0–224.0)
Testosterone: 362 ng/dL (ref 250–827)

## 2020-07-30 LAB — PTH, INTACT AND CALCIUM
Calcium: 10.1 mg/dL (ref 8.6–10.3)
PTH: 41 pg/mL (ref 14–64)

## 2020-07-30 LAB — CALCIUM, IONIZED: Calcium, Ion: 5.3 mg/dL (ref 4.8–5.6)

## 2020-07-30 LAB — ANA: Anti Nuclear Antibody (ANA): NEGATIVE

## 2020-07-30 LAB — TRANSFERRIN: Transferrin: 287 mg/dL (ref 188–341)

## 2020-07-30 LAB — COMPREHENSIVE METABOLIC PANEL
AG Ratio: 1.6 (calc) (ref 1.0–2.5)
ALT: 32 U/L (ref 9–46)
AST: 21 U/L (ref 10–35)
Albumin: 4.6 g/dL (ref 3.6–5.1)
Alkaline phosphatase (APISO): 69 U/L (ref 35–144)
BUN: 13 mg/dL (ref 7–25)
CO2: 30 mmol/L (ref 20–32)
Calcium: 10.1 mg/dL (ref 8.6–10.3)
Chloride: 101 mmol/L (ref 98–110)
Creat: 1.04 mg/dL (ref 0.70–1.33)
Globulin: 2.8 g/dL (calc) (ref 1.9–3.7)
Glucose, Bld: 88 mg/dL (ref 65–99)
Potassium: 4.3 mmol/L (ref 3.5–5.3)
Sodium: 140 mmol/L (ref 135–146)
Total Bilirubin: 0.3 mg/dL (ref 0.2–1.2)
Total Protein: 7.4 g/dL (ref 6.1–8.1)

## 2020-07-30 LAB — CBC WITH DIFFERENTIAL/PLATELET
Absolute Monocytes: 403 cells/uL (ref 200–950)
Basophils Absolute: 11 cells/uL (ref 0–200)
Basophils Relative: 0.2 %
Eosinophils Absolute: 62 cells/uL (ref 15–500)
Eosinophils Relative: 1.1 %
HCT: 43.6 % (ref 38.5–50.0)
Hemoglobin: 14.6 g/dL (ref 13.2–17.1)
Lymphs Abs: 1904 cells/uL (ref 850–3900)
MCH: 31.1 pg (ref 27.0–33.0)
MCHC: 33.5 g/dL (ref 32.0–36.0)
MCV: 93 fL (ref 80.0–100.0)
MPV: 11.5 fL (ref 7.5–12.5)
Monocytes Relative: 7.2 %
Neutro Abs: 3220 cells/uL (ref 1500–7800)
Neutrophils Relative %: 57.5 %
Platelets: 233 10*3/uL (ref 140–400)
RBC: 4.69 10*6/uL (ref 4.20–5.80)
RDW: 12.6 % (ref 11.0–15.0)
Total Lymphocyte: 34 %
WBC: 5.6 10*3/uL (ref 3.8–10.8)

## 2020-07-30 LAB — URIC ACID: Uric Acid, Serum: 4.1 mg/dL (ref 4.0–8.0)

## 2020-07-30 LAB — FERRITIN: Ferritin: 127 ng/mL (ref 38–380)

## 2020-07-30 LAB — C-REACTIVE PROTEIN: CRP: 0.7 mg/L (ref <8.0)

## 2020-07-30 LAB — RHEUMATOID FACTOR: Rheumatoid fact SerPl-aCnc: 16 IU/mL — ABNORMAL HIGH (ref <14)

## 2020-07-30 LAB — CYCLIC CITRUL PEPTIDE ANTIBODY, IGG: Cyclic Citrullin Peptide Ab: <16 UNITS

## 2020-07-30 LAB — VITAMIN D 25 HYDROXY (VIT D DEFICIENCY, FRACTURES): Vit D, 25-Hydroxy: 59 ng/mL (ref 30–100)

## 2020-07-30 LAB — TSH: TSH: 0.91 mIU/L (ref 0.40–4.50)

## 2020-07-30 LAB — ANGIOTENSIN CONVERTING ENZYME: Angiotensin-Converting Enzyme: <5 U/L — ABNORMAL LOW (ref 9–67)

## 2020-07-30 LAB — SEDIMENTATION RATE: Sed Rate: 6 mm/h (ref 0–20)

## 2020-08-04 ENCOUNTER — Encounter: Payer: Self-pay | Admitting: Family Medicine

## 2020-08-20 ENCOUNTER — Other Ambulatory Visit: Payer: Self-pay

## 2020-08-20 ENCOUNTER — Ambulatory Visit
Admission: RE | Admit: 2020-08-20 | Discharge: 2020-08-20 | Disposition: A | Discharge status: Home / Self Care | Payer: 59 | Source: Ambulatory Visit | Attending: Family Medicine | Admitting: Family Medicine

## 2020-08-20 ENCOUNTER — Other Ambulatory Visit: Payer: 59

## 2020-08-20 DIAGNOSIS — M25512 Pain in left shoulder: Secondary | ICD-10-CM

## 2020-08-20 DIAGNOSIS — S43432A Superior glenoid labrum lesion of left shoulder, initial encounter: Secondary | ICD-10-CM | POA: Diagnosis not present

## 2020-08-20 MED ORDER — IOPAMIDOL (ISOVUE-M 200) INJECTION 41%
12.0000 mL | Freq: Once | INTRAMUSCULAR | Status: AC
  Administered 2020-08-20: 12 mL via INTRA_ARTICULAR

## 2020-08-23 ENCOUNTER — Encounter: Payer: Self-pay | Admitting: Family Medicine

## 2020-08-24 ENCOUNTER — Other Ambulatory Visit: Payer: Self-pay

## 2020-08-24 DIAGNOSIS — M25519 Pain in unspecified shoulder: Secondary | ICD-10-CM

## 2020-08-30 ENCOUNTER — Other Ambulatory Visit: Payer: 59

## 2020-09-08 DIAGNOSIS — M7502 Adhesive capsulitis of left shoulder: Secondary | ICD-10-CM | POA: Diagnosis not present

## 2020-09-08 NOTE — Progress Notes (Signed)
Larry [name] Phone: [phone] Subjective:   Larry [name], am serving as a scribe for Dr. Hulan [name]. This visit occurred during the SARS-CoV-2 public health emergency.  Safety protocols were in place, including screening questions prior to the visit, additional usage of staff PPE, and extensive cleaning of exam room while observing appropriate contact time as indicated for disinfecting solutions.  I'm seeing this patient by the request  of:  Larry Charleston, MD  CC: Back pain follow-up  [id number]   07/29/2020 Patient has significant decrease in range of motion of the shoulder at the moment.  Ultrasound does show some calcific changes.  Family history of rheumatoid arthritis and laboratory work-up ordered.  Patient is also secondary to the changes in the labrum I do think an MR arthrogram would be beneficial.  We do see some healing of the rotator cuff noted but I do think once again advanced imaging would be warranted and patient would be a surgical candidate if necessary.  Follow-up again after imaging   Update 09/09/2020 Larry [name] is a 59 y.o. male coming in with complaint of left shoulder pain. Patient states that he is going to do 6 weeks of PT.   Back exam still has some tenderness to palpation in the paraspinal musculature lumbar spine he states.  Some more tightness.  Not being able to work out as regularly.   MRI left shoulder IMPRESSION: 1. Moderate rotator cuff tendinopathy/tendinosis with interstitial tears mainly involving the supraspinatus tendon. There is a rim rent type articular surface tear involving the footprint attachment region of the supraspinatus anteriorly. [name] full-thickness retracted tear. 2. Superior labral tear appears to involve the biceps anchor region. 3. Intact long head biceps tendon. 4. Mild glenohumeral joint degenerative changes with small joint effusion and  moderate synovitis versus adhesive capsulitis. 5. Moderate AC joint degenerative changes but [name] other significant findings for bony impingement.     Past Medical History:  Diagnosis Date  . High cholesterol   . Hyperlipidemia   . Hypertension    Past Surgical History:  Procedure Laterality Date  . APPENDECTOMY     Social History   Socioeconomic History  . Marital status: Married    Spouse name: Not on file  . Number of children: Not on file  . Years of education: Not on file  . Highest education level: Not on file  Occupational History  . Not on file  Tobacco Use  . Smoking status: Never Smoker  . Smokeless tobacco: Never Used  Substance and Sexual Activity  . Alcohol use: Yes    Comment: 2 q wk, socially  . Drug use: [name]  . Sexual activity: Not on file  Other Topics Concern  . Not on file  Social History Narrative  . Not on file   Social Determinants of Health   Financial Resource Strain:   . Difficulty of Paying Living Expenses: Not on file  Food Insecurity:   . Worried About Charity fundraiser in the Last Year: Not on file  . Ran Out of Food in the Last Year: Not on file  Transportation Needs:   . Lack of Transportation (Medical): Not on file  . Lack of Transportation (Non-Medical): Not on file  Physical Activity:   . Days of Exercise per Week: Not on file  . Minutes of Exercise per Session: Not on file  Stress:   . Feeling of Stress : Not  on file  Social Connections:   . Frequency of Communication with Friends and Family: Not on file  . Frequency of Social Gatherings with Friends and Family: Not on file  . Attends Religious Services: Not on file  . Active Member of Clubs or Organizations: Not on file  . Attends Archivist Meetings: Not on file  . Marital Status: Not on file   [name] Known Allergies Family History  Problem Relation Age of Onset  . Hyperlipidemia Mother   . Hyperlipidemia Father     Current Outpatient Medications (Endocrine  & Metabolic):  .  predniSONE (DELTASONE) 50 MG tablet, Take 1 tablet (50 mg total) by mouth daily.  Current Outpatient Medications (Cardiovascular):  .  candesartan-hydrochlorothiazide (ATACAND HCT) 32-12.5 MG tablet, Take 1 tablet by mouth daily. .  hydrochlorothiazide (MICROZIDE) 12.5 MG capsule, Take 12.5 mg by mouth daily. Marland Kitchen  lisinopril (PRINIVIL,ZESTRIL) 10 MG tablet, Take 10 mg by mouth daily. .  simvastatin (ZOCOR) 20 MG tablet, Take 20 mg by mouth daily.   Current Outpatient Medications (Analgesics):  .  HYDROcodone-acetaminophen (NORCO/VICODIN) 5-325 MG tablet, Take 1 tablet by mouth every 6 (six) hours as needed. .  meloxicam (MOBIC) 7.5 MG tablet, Take 1 tablet (7.5 mg total) by mouth daily.   Current Outpatient Medications (Other):  .  cyclobenzaprine (FLEXERIL) 10 MG tablet, One half tab PO qHS, then increase gradually to one tab TID. Marland Kitchen  diazepam (VALIUM) 5 MG tablet, One tab by mouth, 2 hours before procedure. .  Diclofenac Sodium (PENNSAID) 2 % SOLN, Place 2 g onto the skin 2 (two) times daily. .  hydrOXYzine (ATARAX/VISTARIL) 10 MG tablet, Take 1 tablet (10 mg total) by mouth 3 (three) times daily as needed. .  minocycline (MINOCIN,DYNACIN) 50 MG capsule, Take 50 mg by mouth 2 (two) times daily. Marland Kitchen  tiZANidine (ZANAFLEX) 4 MG capsule, 1 tablet at night .  traZODone (DESYREL) 50 MG tablet, Take 0.5-1 tablets (25-50 mg total) by mouth at bedtime as needed for sleep. .  traZODone (DESYREL) 50 MG tablet, Take 0.5-1 tablets (25-50 mg total) by mouth at bedtime as needed for sleep. .  Vitamin D, Ergocalciferol, (DRISDOL) 1.25 MG (50000 UNIT) CAPS capsule, Take 1 capsule (50,000 Units total) by mouth every 7 (seven) days.   Reviewed prior external information including notes and imaging from  primary care provider As well as notes that were available from care everywhere and other healthcare systems.  Past medical history, social, surgical and family history all reviewed in  electronic medical record.  [name] pertanent information unless stated regarding to the chief complaint.   Review of Systems:  [name] headache, visual changes, nausea, vomiting, diarrhea, constipation, dizziness, abdominal pain, skin rash, fevers, chills, night sweats, weight loss, swollen lymph nodes, body aches, joint swelling, chest pain, shortness of breath, mood changes. POSITIVE muscle aches  Objective  Blood pressure 118/76, pulse 86, height 6\' 1"  (1.854 m), weight 168 lb (76.2 kg), SpO2 99 %.   General: [name] apparent distress alert and oriented x3 mood and affect normal, dressed appropriately.  HEENT: Pupils equal, extraocular movements intact  Respiratory: Patient's speak in full sentences and does not appear short of breath  Cardiovascular: [name] lower extremity edema, non tender, [name] erythema  Gait normal with good balance and coordination.  MSK: Low back exam does have some tightness noted in the paraspinal musculature lumbar spine right greater than left.  Mild positive FABER test on the right.  Tightness with straight leg test but [name]  radicular symptoms bilaterally.   Osteopathic findings  C3 flexed rotated and side bent left T8 extended rotated and side bent left L1 flexed rotated and side bent right Sacrum right on right\    Impression and Recommendations:     The above documentation has been reviewed and is accurate and complete Larry Pulley, DO

## 2020-09-09 ENCOUNTER — Other Ambulatory Visit: Payer: Self-pay | Admitting: Family Medicine

## 2020-09-09 ENCOUNTER — Encounter: Payer: Self-pay | Admitting: Family Medicine

## 2020-09-09 ENCOUNTER — Other Ambulatory Visit: Payer: Self-pay

## 2020-09-09 ENCOUNTER — Ambulatory Visit: Payer: 59 | Admitting: Family Medicine

## 2020-09-09 VITALS — BP 118/76 | HR 86 | Ht 73.0 in | Wt 168.0 lb

## 2020-09-09 DIAGNOSIS — M75112 Incomplete rotator cuff tear or rupture of left shoulder, not specified as traumatic: Secondary | ICD-10-CM

## 2020-09-09 DIAGNOSIS — M5416 Radiculopathy, lumbar region: Secondary | ICD-10-CM

## 2020-09-09 DIAGNOSIS — M7502 Adhesive capsulitis of left shoulder: Secondary | ICD-10-CM | POA: Diagnosis not present

## 2020-09-09 DIAGNOSIS — M999 Biomechanical lesion, unspecified: Secondary | ICD-10-CM

## 2020-09-09 DIAGNOSIS — M25612 Stiffness of left shoulder, not elsewhere classified: Secondary | ICD-10-CM | POA: Diagnosis not present

## 2020-09-09 MED ORDER — TRAZODONE HCL 50 MG PO TABS: 25.0000 mg | ORAL_TABLET | Freq: Every evening | ORAL | 3 refills | Status: DC | PRN

## 2020-09-09 MED FILL — traZODone HCL 50 MG TABS: 50 | 30 days supply | Qty: 30 | Fill #0

## 2020-09-09 NOTE — Assessment & Plan Note (Signed)
   Decision today to treat with OMT was based on Physical Exam  After verbal consent patient was treated with HVLA, ME, FPR techniques in cervical, thoracic,  lumbar and sacral areas, all areas are chronic   Patient tolerated the procedure well with improvement in symptoms  Patient given exercises, stretches and lifestyle modifications  See medications in patient instructions if given  Patient will follow up in 4-8 weeks 

## 2020-09-09 NOTE — Assessment & Plan Note (Signed)
Likely no radicular symptoms, secondary due to shoulder pain and some mild back pain having difficulty with sleep, trazodone given to help with sleep time warned potential side effects.  Follow-up with me again 6 weeks

## 2020-09-09 NOTE — Assessment & Plan Note (Signed)
Patient is doing conservative therapy.  Wants to hold on any type of injection at this time.  Has seen surgery who also agrees with conservative therapy.

## 2020-09-09 NOTE — Patient Instructions (Signed)
Trazadone at night See me again in 5-6 weeks

## 2020-09-21 DIAGNOSIS — M7502 Adhesive capsulitis of left shoulder: Secondary | ICD-10-CM | POA: Diagnosis not present

## 2020-09-21 DIAGNOSIS — M25612 Stiffness of left shoulder, not elsewhere classified: Secondary | ICD-10-CM | POA: Diagnosis not present

## 2020-09-28 DIAGNOSIS — M25612 Stiffness of left shoulder, not elsewhere classified: Secondary | ICD-10-CM | POA: Diagnosis not present

## 2020-09-28 DIAGNOSIS — M7502 Adhesive capsulitis of left shoulder: Secondary | ICD-10-CM | POA: Diagnosis not present

## 2020-09-30 DIAGNOSIS — M25612 Stiffness of left shoulder, not elsewhere classified: Secondary | ICD-10-CM | POA: Diagnosis not present

## 2020-09-30 DIAGNOSIS — M7502 Adhesive capsulitis of left shoulder: Secondary | ICD-10-CM | POA: Diagnosis not present

## 2020-10-03 DIAGNOSIS — Z20822 Contact with and (suspected) exposure to covid-19: Secondary | ICD-10-CM | POA: Diagnosis not present

## 2020-10-11 DIAGNOSIS — M25612 Stiffness of left shoulder, not elsewhere classified: Secondary | ICD-10-CM | POA: Diagnosis not present

## 2020-10-11 DIAGNOSIS — M7502 Adhesive capsulitis of left shoulder: Secondary | ICD-10-CM | POA: Diagnosis not present

## 2020-10-15 DIAGNOSIS — M25612 Stiffness of left shoulder, not elsewhere classified: Secondary | ICD-10-CM | POA: Diagnosis not present

## 2020-10-15 DIAGNOSIS — M7502 Adhesive capsulitis of left shoulder: Secondary | ICD-10-CM | POA: Diagnosis not present

## 2020-10-18 DIAGNOSIS — M7501 Adhesive capsulitis of right shoulder: Secondary | ICD-10-CM | POA: Diagnosis not present

## 2020-10-18 NOTE — Progress Notes (Signed)
Larry [name] [address] [place] Phone: [phone] Subjective:   I Larry [name] am serving as a Education administrator for Dr. Hulan Saas.  This visit occurred during the SARS-CoV-2 public health emergency.  Safety protocols were in place, including screening questions prior to the visit, additional usage of staff PPE, and extensive cleaning of exam room while observing appropriate contact time as indicated for disinfecting solutions.   I'm seeing this patient by the request  of:  Rice, Wayland Denis, MD  CC: Left shoulder pain, back pain follow-up  [id number]  Larry [name] is a 59 y.o. male coming in with complaint of back and neck pain. OMT 08/30/2020. Also having left shoulder pain. Patient states he is doing much better today.   Medications patient has been prescribed: Hydroxizine, Zanaflex, Trazadone  Taking: Intermittently         Reviewed prior external information including notes and imaging from previsou exam, outside providers and external EMR if available.   As well as notes that were available from care everywhere and other healthcare systems.  Past medical history, social, surgical and family history all reviewed in electronic medical record.  No pertanent information unless stated regarding to the chief complaint.   Past Medical History:  Diagnosis Date  . High cholesterol   . Hyperlipidemia   . Hypertension     No Known Allergies   Review of Systems:  No headache, visual changes, nausea, vomiting, diarrhea, constipation, dizziness, abdominal pain, skin rash, fevers, chills, night sweats, weight loss, swollen lymph nodes, body aches, joint swelling, chest pain, shortness of breath, mood changes. POSITIVE muscle aches  Objective  Blood pressure 130/80, pulse 85, height 6\' 1"  (1.854 m), weight 178 lb (80.7 kg), SpO2 98 %.   General: No apparent distress alert and oriented x3 mood and affect normal, dressed  appropriately.  HEENT: Pupils equal, extraocular movements intact  Respiratory: Patient's speak in full sentences and does not appear short of breath  Cardiovascular: No lower extremity edema, non tender, no erythema  Neuro: Cranial nerves II through XII are intact, neurovascularly intact in all extremities with 2+ DTRs and 2+ pulses.  Gait normal with good balance and coordination.  Left shoulder does have some decrease in range of motion.  Patient does have difficulty with internal range of motion to lateral hip and external range of motion of 10 degrees compared to full on the contralateral side.  Rotator cuff strength no significant tenderness.  Osteopathic findings  C5 flexed rotated and side bent right T9 extended rotated and side bent left L2 flexed rotated and side bent right L4 flexed rotated and side bent right Sacrum right on right       Assessment and Plan:  Left rotator cuff tear Patient still has some loss of range of motion.  Strength has improved.  Patient wants to continue with conservative therapy.  He did not want to do surgery at this moment.  Seems to be having more of an adhesive capsulitis at this time.  Patient states encouraged to continue to increase range of motion and follow-up with me again 6 to 8 weeks  Right lumbar radiculitis No radicular signs at this time.  Continue to be active.  Discussed posture and ergonomics, discussed which activities to do which wants to avoid.  Increase activity slowly.  Follow-up again in 6 to 8 weeks    Nonallopathic problems  Decision today to treat with OMT was based on Physical Exam  After verbal consent patient was treated with HVLA, ME, FPR techniques in cervical,  thoracic, lumbar, and sacral  areas  Patient tolerated the procedure well with improvement in symptoms  Patient given exercises, stretches and lifestyle modifications  See medications in patient instructions if given  Patient will follow up in 6-8  weeks      The above documentation has been reviewed and is accurate and complete Lyndal Pulley, DO       Note: This dictation was prepared with Dragon dictation along with smaller phrase technology. Any transcriptional errors that result from this process are unintentional.

## 2020-10-19 ENCOUNTER — Other Ambulatory Visit: Payer: Self-pay

## 2020-10-19 ENCOUNTER — Encounter: Payer: Self-pay | Admitting: Family Medicine

## 2020-10-19 ENCOUNTER — Ambulatory Visit: Payer: 59 | Admitting: Family Medicine

## 2020-10-19 VITALS — BP 130/80 | HR 85 | Ht 73.0 in | Wt 178.0 lb

## 2020-10-19 DIAGNOSIS — M5416 Radiculopathy, lumbar region: Secondary | ICD-10-CM | POA: Diagnosis not present

## 2020-10-19 DIAGNOSIS — M999 Biomechanical lesion, unspecified: Secondary | ICD-10-CM | POA: Diagnosis not present

## 2020-10-19 DIAGNOSIS — M75112 Incomplete rotator cuff tear or rupture of left shoulder, not specified as traumatic: Secondary | ICD-10-CM

## 2020-10-19 NOTE — Assessment & Plan Note (Signed)
No radicular signs at this time.  Continue to be active.  Discussed posture and ergonomics, discussed which activities to do which wants to avoid.  Increase activity slowly.  Follow-up again in 6 to 8 weeks

## 2020-10-19 NOTE — Patient Instructions (Addendum)
Keep working on ROM of shoulder See me in 6-8 weeks Happy Holidays!

## 2020-10-19 NOTE — Assessment & Plan Note (Signed)
Patient still has some loss of range of motion.  Strength has improved.  Patient wants to continue with conservative therapy.  He did not want to do surgery at this moment.  Seems to be having more of an adhesive capsulitis at this time.  Patient states encouraged to continue to increase range of motion and follow-up with me again 6 to 8 weeks

## 2020-10-26 DIAGNOSIS — M7502 Adhesive capsulitis of left shoulder: Secondary | ICD-10-CM | POA: Diagnosis not present

## 2020-10-26 DIAGNOSIS — M25612 Stiffness of left shoulder, not elsewhere classified: Secondary | ICD-10-CM | POA: Diagnosis not present

## 2020-11-02 DIAGNOSIS — M25612 Stiffness of left shoulder, not elsewhere classified: Secondary | ICD-10-CM | POA: Diagnosis not present

## 2020-11-02 DIAGNOSIS — M7502 Adhesive capsulitis of left shoulder: Secondary | ICD-10-CM | POA: Diagnosis not present

## 2020-11-08 DIAGNOSIS — M7502 Adhesive capsulitis of left shoulder: Secondary | ICD-10-CM | POA: Diagnosis not present

## 2020-11-08 DIAGNOSIS — M25612 Stiffness of left shoulder, not elsewhere classified: Secondary | ICD-10-CM | POA: Diagnosis not present

## 2020-11-12 DIAGNOSIS — M7502 Adhesive capsulitis of left shoulder: Secondary | ICD-10-CM | POA: Diagnosis not present

## 2020-11-12 DIAGNOSIS — M25612 Stiffness of left shoulder, not elsewhere classified: Secondary | ICD-10-CM | POA: Diagnosis not present

## 2020-11-16 DIAGNOSIS — M25612 Stiffness of left shoulder, not elsewhere classified: Secondary | ICD-10-CM | POA: Diagnosis not present

## 2020-11-16 DIAGNOSIS — M7502 Adhesive capsulitis of left shoulder: Secondary | ICD-10-CM | POA: Diagnosis not present

## 2020-11-18 MED FILL — LISINOPRIL 10 MG TABS: 10 | 90 days supply | Qty: 90 | Fill #1

## 2020-11-18 MED FILL — SIMVASTATIN 20 MG TABLET: 20 | 90 days supply | Qty: 90 | Fill #1

## 2020-11-22 MED FILL — HYDROCHLOROTHIAZIDE 12.5 MG: 12.5 | 90 days supply | Qty: 90 | Fill #1

## 2020-11-24 DIAGNOSIS — M25612 Stiffness of left shoulder, not elsewhere classified: Secondary | ICD-10-CM | POA: Diagnosis not present

## 2020-11-24 DIAGNOSIS — M7502 Adhesive capsulitis of left shoulder: Secondary | ICD-10-CM | POA: Diagnosis not present

## 2020-11-29 DIAGNOSIS — M7502 Adhesive capsulitis of left shoulder: Secondary | ICD-10-CM | POA: Diagnosis not present

## 2020-11-29 DIAGNOSIS — M25612 Stiffness of left shoulder, not elsewhere classified: Secondary | ICD-10-CM | POA: Diagnosis not present

## 2020-12-02 DIAGNOSIS — M25612 Stiffness of left shoulder, not elsewhere classified: Secondary | ICD-10-CM | POA: Diagnosis not present

## 2020-12-02 DIAGNOSIS — M7502 Adhesive capsulitis of left shoulder: Secondary | ICD-10-CM | POA: Diagnosis not present

## 2020-12-05 NOTE — Progress Notes (Signed)
[place] Phone: [phone] Subjective:   Larry [name], am serving as a scribe for Dr. Hulan Saas. This visit occurred during the SARS-CoV-2 public health emergency.  Safety protocols were in place, including screening questions prior to the visit, additional usage of staff PPE, and extensive cleaning of exam room while observing appropriate contact time as indicated for disinfecting solutions.   I'm seeing this patient by the request  of:  [name], Larry Denis, MD  CC: shoulder and back pain follow up   [id number]  Larry [name] is a 60 y.o. male coming in with complaint of back and neck pain. OMT 10/19/2020. Patient states that both is back and shoulder are improving. He is doing PT for the shoulder. Does not have full ROM but states that he is close to his baseline.   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   As well as notes that were available from care everywhere and other healthcare systems.  Past medical history, social, surgical and family history all reviewed in electronic medical record.  [name] pertanent information unless stated regarding to the chief complaint.   Past Medical History:  Diagnosis Date   High cholesterol    Hyperlipidemia    Hypertension     [name] Known Allergies   Review of Systems:  [name] headache, visual changes, nausea, vomiting, diarrhea, constipation, dizziness, abdominal pain, skin rash, fevers, chills, night sweats, weight loss, swollen lymph nodes, body aches, joint swelling, chest pain, shortness of breath, mood changes. POSITIVE muscle aches  Objective   Blood pressure 110/62, pulse 81, height 6\' 1"  (1.854 m), weight 178 lb (80.7 kg), SpO2 98 %.   General: [name] apparent distress alert and oriented x3 mood and affect normal, dressed appropriately.  HEENT: Pupils equal, extraocular movements intact  Respiratory: Patient's speak in full sentences and does not appear short of breath  Cardiovascular: [name] lower extremity edema, non tender, [name] erythema  Neuro: Cranial nerves II through XII are intact, neurovascularly intact in all extremities with 2+ DTRs and 2+ pulses.  Gait normal with good balance and coordination.  MSK:  Left shoulder good strength but positive impingement still noted and positive crossover   Low back mild TTT mostly right SIJ mild crossover, mild tightness paraspinal musculature in lower lumbar 5/5 strenght of lower extremity   Osteopathic findings  C5 flexed rotated and side bent left T5 extended rotated and side bent right  L2 flexed rotated and side bent right Sacrum right on right       Assessment and Plan:  Left rotator cuff tear Small inter substance tears, with mild GHJ arthritis and moderate ACJ, patient continues to decline injection, feels like he is making progress, continue conservative therapy with patient making progress.  RTC in 6-8 weeks   Right lumbar radiculitis [name] radicular symptoms at this time, TTP and tightness noted, discuss HEP, continue cureent therapy RTC in 6-8 weeks     Nonallopathic problems  Decision today to treat with OMT was based on Physical Exam  After verbal consent patient was treated with HVLA, ME, FPR techniques in cervical,  thoracic, lumbar, and sacral  areas  Patient  tolerated the procedure well with improvement in symptoms  Patient given exercises, stretches and lifestyle modifications  See medications in patient instructions if given  Patient will follow up in 6-8 weeks      The above documentation has been reviewed and is accurate and complete Larry Pulley, DO       Note: This dictation was prepared with Dragon dictation along with smaller phrase technology. Any transcriptional errors that result from this process are unintentional.

## 2020-12-06 ENCOUNTER — Other Ambulatory Visit: Payer: Self-pay

## 2020-12-06 ENCOUNTER — Encounter: Payer: Self-pay | Admitting: Family Medicine

## 2020-12-06 ENCOUNTER — Ambulatory Visit: Payer: 59 | Admitting: Family Medicine

## 2020-12-06 VITALS — BP 110/62 | HR 81 | Ht 73.0 in | Wt 178.0 lb

## 2020-12-06 DIAGNOSIS — M999 Biomechanical lesion, unspecified: Secondary | ICD-10-CM | POA: Diagnosis not present

## 2020-12-06 DIAGNOSIS — M75112 Incomplete rotator cuff tear or rupture of left shoulder, not specified as traumatic: Secondary | ICD-10-CM | POA: Diagnosis not present

## 2020-12-06 DIAGNOSIS — M5416 Radiculopathy, lumbar region: Secondary | ICD-10-CM | POA: Diagnosis not present

## 2020-12-06 NOTE — Assessment & Plan Note (Signed)
Small inter substance tears, with mild GHJ arthritis and moderate ACJ, patient continues to decline injection, feels like he is making progress, continue conservative therapy with patient making progress.  RTC in 6-8 weeks

## 2020-12-06 NOTE — Assessment & Plan Note (Signed)
No radicular symptoms at this time, TTP and tightness noted, discuss HEP, continue cureent therapy RTC in 6-8 weeks

## 2020-12-06 NOTE — Patient Instructions (Signed)
Great to see you Keep working on the shoulder See me in 6-8 weeks

## 2020-12-07 DIAGNOSIS — M25612 Stiffness of left shoulder, not elsewhere classified: Secondary | ICD-10-CM | POA: Diagnosis not present

## 2020-12-07 DIAGNOSIS — M7502 Adhesive capsulitis of left shoulder: Secondary | ICD-10-CM | POA: Diagnosis not present

## 2020-12-17 DIAGNOSIS — M25612 Stiffness of left shoulder, not elsewhere classified: Secondary | ICD-10-CM | POA: Diagnosis not present

## 2020-12-17 DIAGNOSIS — M7502 Adhesive capsulitis of left shoulder: Secondary | ICD-10-CM | POA: Diagnosis not present

## 2020-12-21 DIAGNOSIS — M25612 Stiffness of left shoulder, not elsewhere classified: Secondary | ICD-10-CM | POA: Diagnosis not present

## 2020-12-21 DIAGNOSIS — M7502 Adhesive capsulitis of left shoulder: Secondary | ICD-10-CM | POA: Diagnosis not present

## 2020-12-28 DIAGNOSIS — M25612 Stiffness of left shoulder, not elsewhere classified: Secondary | ICD-10-CM | POA: Diagnosis not present

## 2020-12-28 DIAGNOSIS — M7502 Adhesive capsulitis of left shoulder: Secondary | ICD-10-CM | POA: Diagnosis not present

## 2020-12-31 DIAGNOSIS — M25612 Stiffness of left shoulder, not elsewhere classified: Secondary | ICD-10-CM | POA: Diagnosis not present

## 2020-12-31 DIAGNOSIS — M7502 Adhesive capsulitis of left shoulder: Secondary | ICD-10-CM | POA: Diagnosis not present

## 2021-01-03 DIAGNOSIS — M7502 Adhesive capsulitis of left shoulder: Secondary | ICD-10-CM | POA: Diagnosis not present

## 2021-01-03 DIAGNOSIS — M25612 Stiffness of left shoulder, not elsewhere classified: Secondary | ICD-10-CM | POA: Diagnosis not present

## 2021-01-06 DIAGNOSIS — M25612 Stiffness of left shoulder, not elsewhere classified: Secondary | ICD-10-CM | POA: Diagnosis not present

## 2021-01-06 DIAGNOSIS — M7502 Adhesive capsulitis of left shoulder: Secondary | ICD-10-CM | POA: Diagnosis not present

## 2021-01-18 ENCOUNTER — Ambulatory Visit: Payer: 59 | Admitting: Family Medicine

## 2021-01-26 NOTE — Progress Notes (Signed)
Larry [name] [address] [place] Phone: [phone] Subjective:   This visit occurred during the SARS-CoV-2 public health emergency.  Safety protocols were in place, including screening questions prior to the visit, additional usage of staff PPE, and extensive cleaning of exam room while observing appropriate contact time as indicated for disinfecting solutions.  [name], am serving as a scribe for Dr. Hulan Saas.  I'm seeing this patient by the request  of:  Rice, Wayland Denis, MD  CC: Low back pain follow-up  [id number]  Larry [name] is a 60 y.o. male coming in with complaint of back and neck pain. OMT 12/06/2020. Patient states that it is time for OMT. Used to manage his back pain.    Patient also was found to have a left rotator cuff tear but seemed to be doing relatively well with the conservative therapy.  Patient was found to have small intrasubstance tears noted and mild glenohumeral arthritis and moderate AC joint arthritis.  Medications patient has been prescribed: None          Reviewed prior external information including notes and imaging from previsou exam, outside providers and external EMR if available.   As well as notes that were available from care everywhere and other healthcare systems.  Past medical history, social, surgical and family history all reviewed in electronic medical record.  No pertanent information unless stated regarding to the chief complaint.   Past Medical History:  Diagnosis Date  . High cholesterol   . Hyperlipidemia   . Hypertension     No Known Allergies   Review of Systems:  No headache, visual changes, nausea, vomiting, diarrhea, constipation, dizziness, abdominal pain, skin rash, fevers, chills, night sweats, weight loss, swollen lymph nodes, body aches, joint swelling, chest pain, shortness of breath, mood changes. POSITIVE muscle aches  Objective  Blood  pressure (!) 124/98, pulse 85, height 6\' 1"  (1.854 m), weight 176 lb (79.8 kg), SpO2 99 %.   General: No apparent distress alert and oriented x3 mood and affect normal, dressed appropriately.  HEENT: Pupils equal, extraocular movements intact  Respiratory: Patient's speak in full sentences and does not appear short of breath  Cardiovascular: No lower extremity edema, non tender, no erythema  Gait normal with good balance and coordination.  MSK: Left shoulder exam shows the patient does still have positive impingement noted.  Positive crossover noted.  He does have good strength though noted near full range of motion Back -tender to palpation mostly of the right sacroiliac joint.  Tender to palpation in the paraspinal musculature of the lumbar spine right greater than left.  5 out of 5 strength of the lower extremities.  Osteopathic findings  C4 flexed rotated and side bent left T4 extended rotated and side bent left L2 flexed rotated and side bent right Sacrum right on right       Assessment and Plan:  Right lumbar radiculitis Likely no radicular symptoms at this time.  Discussed icing regimen and home exercises.  Discussed different ergonomic changes at work including adjustable standing desk to be beneficial in keeping monitor at eye level.  Patient has responded very well though to osteopathic manipulation.  Follow-up with me again in 6 to 7 weeks.    Nonallopathic problems  Decision today to treat with OMT was based on Physical Exam  After verbal consent patient was treated with HVLA, ME, FPR techniques in cervical, rib, thoracic, lumbar, and sacral  areas  Patient tolerated the procedure well with improvement in symptoms  Patient given exercises, stretches and lifestyle modifications  See medications in patient instructions if given  Patient will follow up in 6-7 weeks      The above documentation has been reviewed and is accurate and complete Lyndal Pulley,  DO       Note: This dictation was prepared with Dragon dictation along with smaller phrase technology. Any transcriptional errors that result from this process are unintentional.

## 2021-01-27 ENCOUNTER — Ambulatory Visit: Payer: 59 | Admitting: Family Medicine

## 2021-01-27 ENCOUNTER — Other Ambulatory Visit: Payer: Self-pay

## 2021-01-27 ENCOUNTER — Encounter: Payer: Self-pay | Admitting: Family Medicine

## 2021-01-27 VITALS — BP 124/98 | HR 85 | Ht 73.0 in | Wt 176.0 lb

## 2021-01-27 DIAGNOSIS — M5416 Radiculopathy, lumbar region: Secondary | ICD-10-CM

## 2021-01-27 DIAGNOSIS — M999 Biomechanical lesion, unspecified: Secondary | ICD-10-CM

## 2021-01-27 NOTE — Patient Instructions (Signed)
Consider adjustable standing desk Keep shoulders down and back Stay active Plan something fun See me in 6-7 weeks

## 2021-01-27 NOTE — Assessment & Plan Note (Signed)
Likely no radicular symptoms at this time.  Discussed icing regimen and home exercises.  Discussed different ergonomic changes at work including adjustable standing desk to be beneficial in keeping monitor at eye level.  Patient has responded very well though to osteopathic manipulation.  Follow-up with me again in 6 to 7 weeks.

## 2021-02-01 IMAGING — DX DG WRIST COMPLETE 3+V*L*
4 series · 4 of 4 positions shown · non-contrast
Comparison: None.

CLINICAL DATA: Left wrist pain, no known injury, initial encounter

EXAM:
LEFT WRIST - COMPLETE 3+ VIEW

[wrist pa]
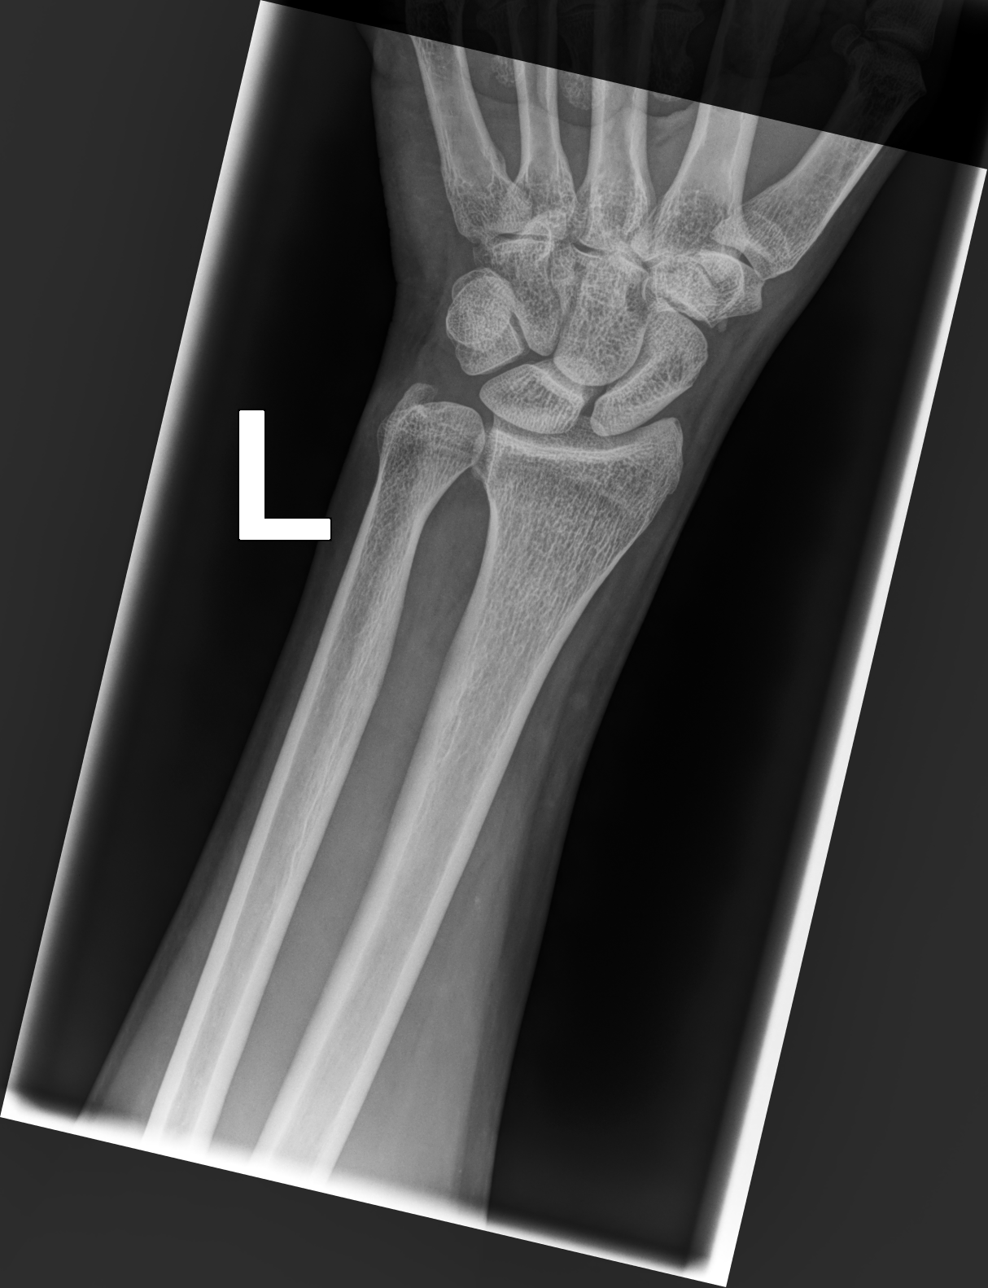

[wrist mlo]
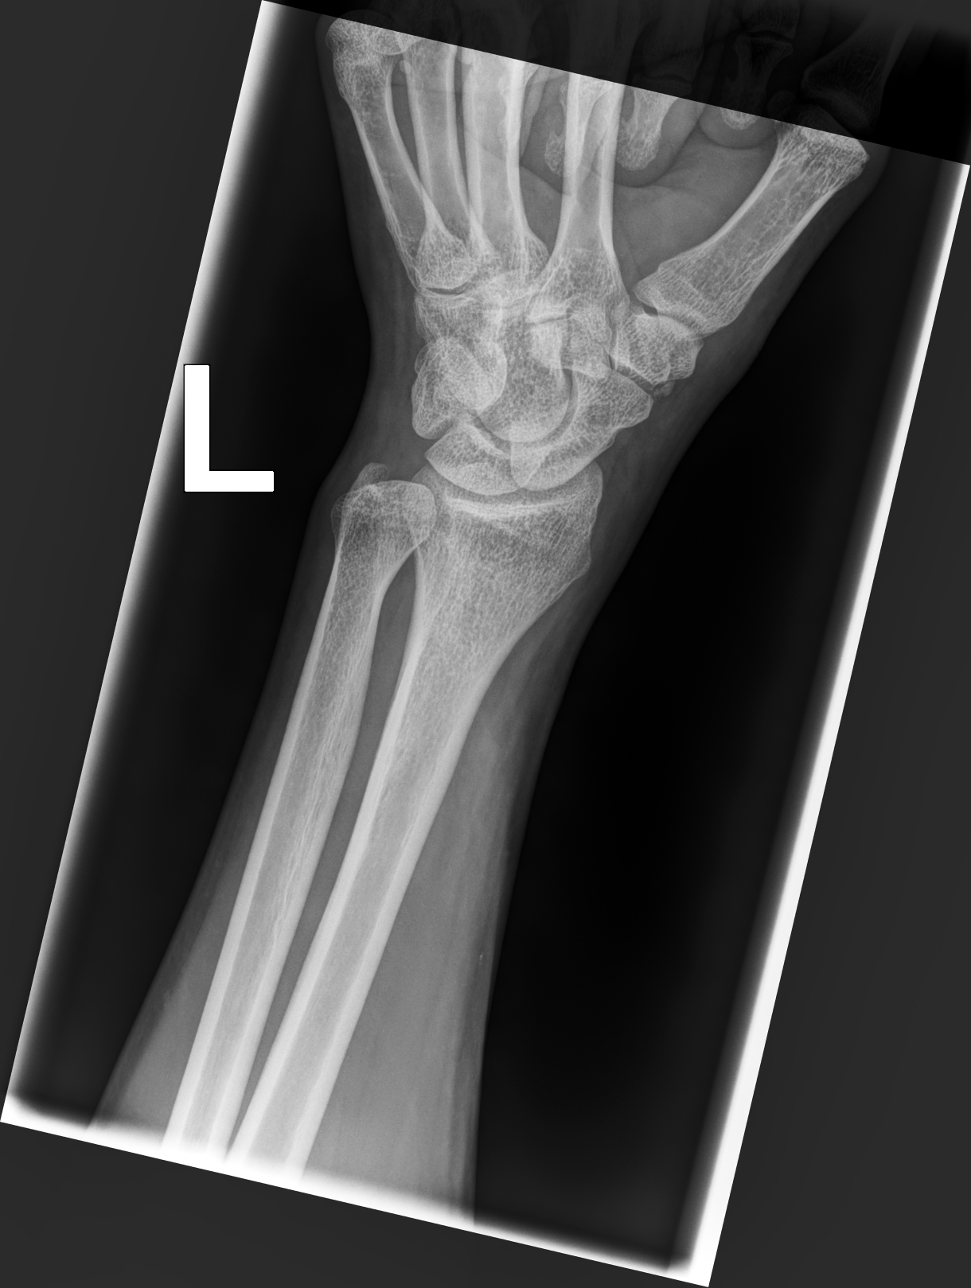

[wrist lat]
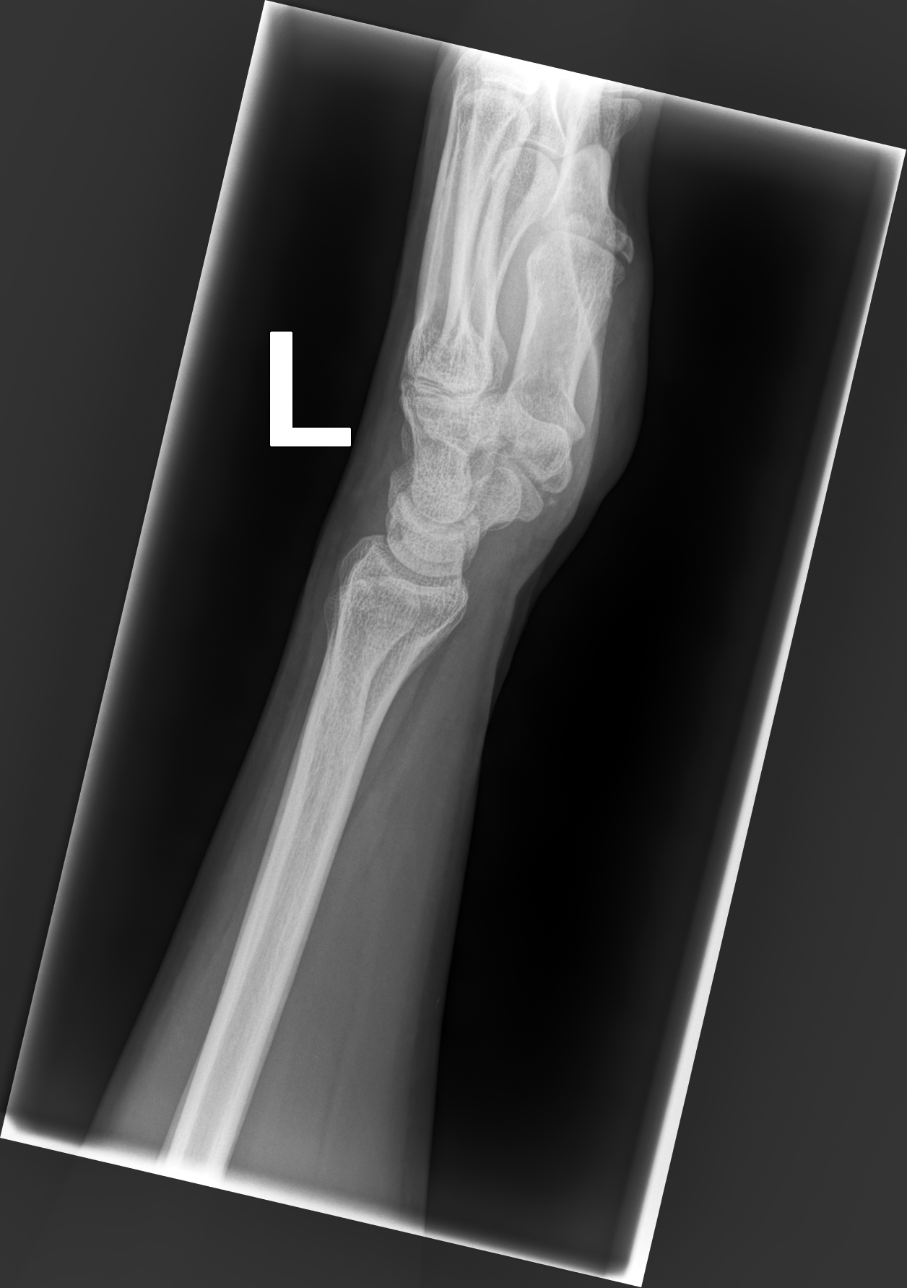

[hand pa]
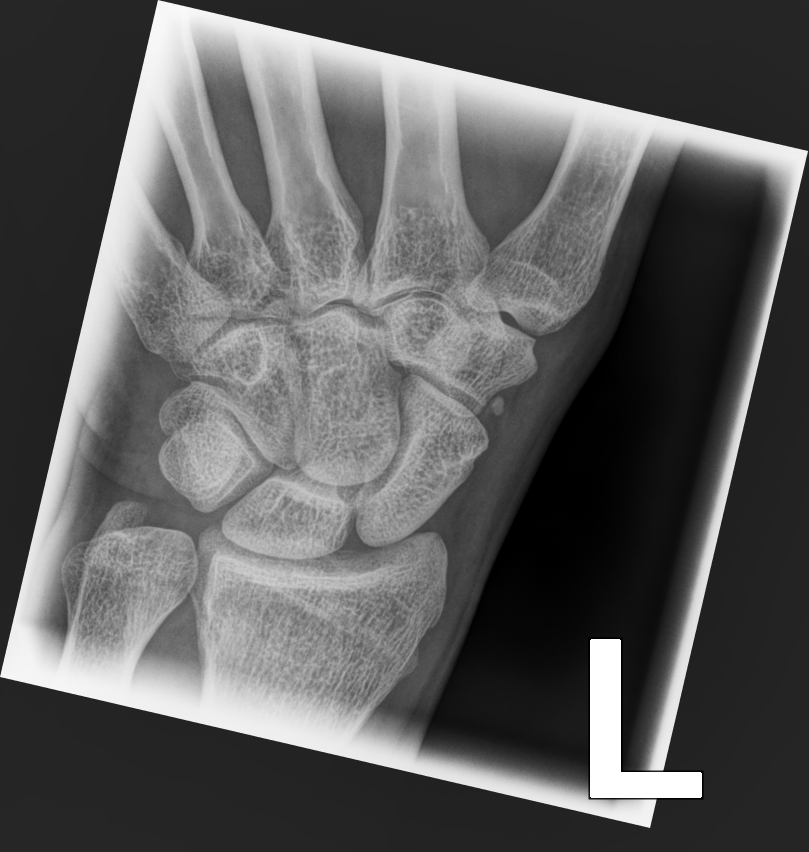

[4 of 4 positions shown; findings below may reference images not displayed]

FINDINGS: No acute fracture or dislocation is noted. Well corticated bony
density is noted adjacent to the trapezium laterally likely related
to prior trauma and nonunion. No soft tissue abnormality is seen.
IMPRESSION: Small bony density noted adjacent to the trapezium likely related to
prior trauma and nonunion.

## 2021-02-02 ENCOUNTER — Other Ambulatory Visit: Payer: Self-pay | Admitting: Family Medicine

## 2021-02-03 ENCOUNTER — Other Ambulatory Visit: Payer: Self-pay | Admitting: Family Medicine

## 2021-02-03 MED ORDER — HYDROXYZINE HCL 10 MG PO TABS
10.0000 mg | ORAL_TABLET | Freq: Three times a day (TID) | ORAL | 0 refills | Status: DC | PRN
Start: 2021-02-03 — End: 2021-02-03

## 2021-02-03 MED FILL — hydrOXYzine HCL 10 MG TABS: 10 | 10 days supply | Qty: 30 | Fill #0

## 2021-02-04 ENCOUNTER — Other Ambulatory Visit (HOSPITAL_COMMUNITY): Payer: Self-pay | Admitting: Pharmacist

## 2021-02-04 MED FILL — CARESTART COVID-19 HOME TES: 4 days supply | Qty: 4 | Fill #0

## 2021-03-01 DIAGNOSIS — M7502 Adhesive capsulitis of left shoulder: Secondary | ICD-10-CM | POA: Diagnosis not present

## 2021-03-01 DIAGNOSIS — M25612 Stiffness of left shoulder, not elsewhere classified: Secondary | ICD-10-CM | POA: Diagnosis not present

## 2021-03-10 ENCOUNTER — Other Ambulatory Visit: Payer: Self-pay

## 2021-03-10 ENCOUNTER — Ambulatory Visit: Payer: 59 | Admitting: Family Medicine

## 2021-03-10 ENCOUNTER — Encounter: Payer: Self-pay | Admitting: Family Medicine

## 2021-03-10 VITALS — BP 118/84 | HR 92 | Ht 73.0 in | Wt 178.0 lb

## 2021-03-10 DIAGNOSIS — M5416 Radiculopathy, lumbar region: Secondary | ICD-10-CM | POA: Diagnosis not present

## 2021-03-10 DIAGNOSIS — M999 Biomechanical lesion, unspecified: Secondary | ICD-10-CM | POA: Diagnosis not present

## 2021-03-10 DIAGNOSIS — M9902 Segmental and somatic dysfunction of thoracic region: Secondary | ICD-10-CM | POA: Diagnosis not present

## 2021-03-10 DIAGNOSIS — M9903 Segmental and somatic dysfunction of lumbar region: Secondary | ICD-10-CM | POA: Diagnosis not present

## 2021-03-10 DIAGNOSIS — M9901 Segmental and somatic dysfunction of cervical region: Secondary | ICD-10-CM

## 2021-03-10 NOTE — Patient Instructions (Addendum)
Good to see you Try patellar strap with working out Do not bend past 85 degrees Yoga wheel could help upper back Consider sleep study See me in 6-8 weeks

## 2021-03-10 NOTE — Assessment & Plan Note (Signed)
Patient is doing relatively well at this moment.  Some mild discomfort from time to time.  Has had muscle relaxers in the past.  Not taking anything regularly.  Discussed posture and ergonomics, follow-up with me again in 6 to 8 weeks

## 2021-03-10 NOTE — Progress Notes (Signed)
Margaret [address] [place] Phone: [phone] Subjective:   I Larry [name] am serving as a Education administrator for Dr. Hulan Saas.  This visit occurred during the SARS-CoV-2 public health emergency.  Safety protocols were in place, including screening questions prior to the visit, additional usage of staff PPE, and extensive cleaning of exam room while observing appropriate contact time as indicated for disinfecting solutions.   I'm seeing this patient by the request  of:  Rice, Wayland Denis, MD  CC: Back and neck pain follow-up  [id number]  Larry [name] is a 60 y.o. male coming in with complaint of back and neck pain. OMT 01/27/2021. Patient states he is doing well and has no major complaints.   Medications patient has been prescribed: None          Reviewed prior external information including notes and imaging from previsou exam, outside providers and external EMR if available.   As well as notes that were available from care everywhere and other healthcare systems.  Past medical history, social, surgical and family history all reviewed in electronic medical record.  No pertanent information unless stated regarding to the chief complaint.   Past Medical History:  Diagnosis Date  . High cholesterol   . Hyperlipidemia   . Hypertension     No Known Allergies   Review of Systems:  No headache, visual changes, nausea, vomiting, diarrhea, constipation, dizziness, abdominal pain, skin rash, fevers, chills, night sweats, weight loss, swollen lymph nodes, body aches, joint swelling, chest pain, shortness of breath, mood changes. POSITIVE muscle aches  Objective  Blood pressure 118/84, pulse 92, height 6\' 1"  (1.854 m), weight 178 lb (80.7 kg), SpO2 98 %.   General: No apparent distress alert and oriented x3 mood and affect normal, dressed appropriately.  HEENT: Pupils equal, extraocular movements intact  Respiratory:  Patient's speak in full sentences and does not appear short of breath  Cardiovascular: No lower extremity edema, non tender, no erythema  Gait normal with good balance and coordination.  MSK:  Non tender with full range of motion and good stability and symmetric strength and tone of shoulders, elbows, wrist, hip, knee and ankles bilaterally.  Back -back pain shows mild tightness more in the paraspinal musculature of the lumbar spine right greater than left.  Osteopathic findings C5 flexed rotated and side bent left T9 extended rotated and side bent left L2 flexed rotated and side bent right Sacrum right on right       Assessment and Plan:  Right lumbar radiculitis Patient is doing relatively well at this moment.  Some mild discomfort from time to time.  Has had muscle relaxers in the past.  Not taking anything regularly.  Discussed posture and ergonomics, follow-up with me again in 6 to 8 weeks    Nonallopathic problems  Decision today to treat with OMT was based on Physical Exam  After verbal consent patient was treated with HVLA, ME, FPR techniques in cervical, thoracic, lumbar, and sacral  areas  Patient tolerated the procedure well with improvement in symptoms  Patient given exercises, stretches and lifestyle modifications  See medications in patient instructions if given  Patient will follow up in 4-8 weeks      The above documentation has been reviewed and is accurate and complete Lyndal Pulley, DO       Note: This dictation was prepared with Dragon dictation along with smaller phrase technology. Any transcriptional errors that result from  this process are unintentional.

## 2021-03-11 ENCOUNTER — Encounter (INDEPENDENT_AMBULATORY_CARE_PROVIDER_SITE_OTHER): Payer: Self-pay | Admitting: Otolaryngology

## 2021-03-11 ENCOUNTER — Ambulatory Visit (INDEPENDENT_AMBULATORY_CARE_PROVIDER_SITE_OTHER): Payer: 59 | Admitting: Otolaryngology

## 2021-03-11 VITALS — Temp 97.3°F

## 2021-03-11 DIAGNOSIS — H6123 Impacted cerumen, bilateral: Secondary | ICD-10-CM | POA: Diagnosis not present

## 2021-03-11 NOTE — Progress Notes (Signed)
HPI: Larry [name] is a 60 y.o. male who presents for evaluation of wax buildup in his ears especially on the left side as blocked sometimes in the morning.  He was last cleaned in August 2020.Marland Kitchen  Past Medical History:  Diagnosis Date  . High cholesterol   . Hyperlipidemia   . Hypertension    Past Surgical History:  Procedure Laterality Date  . APPENDECTOMY     Social History   Socioeconomic History  . Marital status: Married    Spouse name: Not on file  . Number of children: Not on file  . Years of education: Not on file  . Highest education level: Not on file  Occupational History  . Not on file  Tobacco Use  . Smoking status: Never Smoker  . Smokeless tobacco: Never Used  Substance and Sexual Activity  . Alcohol use: Yes    Comment: 2 q wk, socially  . Drug use: No  . Sexual activity: Not on file  Other Topics Concern  . Not on file  Social History Narrative  . Not on file   Social Determinants of Health   Financial Resource Strain: Not on file  Food Insecurity: Not on file  Transportation Needs: Not on file  Physical Activity: Not on file  Stress: Not on file  Social Connections: Not on file   Family History  Problem Relation Age of Onset  . Hyperlipidemia Mother   . Hyperlipidemia Father    No Known Allergies Prior to Admission medications   Medication Sig Start Date End Date Taking? Authorizing Provider  candesartan-hydrochlorothiazide (ATACAND HCT) 32-12.5 MG tablet Take 1 tablet by mouth daily.    [provider]  COVID-19 At Home Antigen Test KIT USE AS [place] Patient taking differently: USE AS DIRECTED WITHIN PACKAGE INSTRUCTIONS 02/04/21 02/04/22  Edmon Crape, RPH  cyclobenzaprine (FLEXERIL) 10 MG tablet One half tab PO qHS, then increase gradually to one tab TID. 09/17/17   Silverio Decamp, MD  diazepam (VALIUM) 5 MG tablet One tab by mouth, 2 hours before procedure. 07/29/20   Lyndal Pulley,  DO  Diclofenac Sodium (PENNSAID) 2 % SOLN Place 2 g onto the skin 2 (two) times daily. 04/28/19   Lyndal Pulley, DO  hydrochlorothiazide (MICROZIDE) 12.5 MG capsule Take 12.5 mg by mouth daily.    [provider]  hydrochlorothiazide (MICROZIDE) 12.5 MG capsule TAKE 1 CAPSULE BY MOUTH DAILY FOR BLOOD PRESSURE 07/19/20 07/19/21  Bobbye Charleston, MD  HYDROcodone-acetaminophen (NORCO/VICODIN) 5-325 MG tablet Take 1 tablet by mouth every 6 (six) hours as needed. 01/13/20   Gregor Hams, MD  hydrOXYzine (ATARAX/VISTARIL) 10 MG tablet TAKE 1 TABLET BY MOUTH 3 TIMES DAILY AS NEEDED 02/03/21 02/03/22  Lyndal Pulley, DO  lisinopril (PRINIVIL,ZESTRIL) 10 MG tablet Take 10 mg by mouth daily.    [provider]  lisinopril (ZESTRIL) 10 MG tablet TAKE 1 TABLET BY MOUTH DAILY FOR BLOOD PRESSURE Patient taking differently: Take by mouth daily. for blood pressure 07/19/20 07/19/21  Rice, Wayland Denis, MD  meloxicam (MOBIC) 7.5 MG tablet Take 1 tablet (7.5 mg total) by mouth daily. 07/29/19   Lyndal Pulley, DO  minocycline (MINOCIN,DYNACIN) 50 MG capsule Take 50 mg by mouth 2 (two) times daily.    [provider]  predniSONE (DELTASONE) 50 MG tablet Take 1 tablet (50 mg total) by mouth daily. 01/13/20   Gregor Hams, MD  simvastatin (ZOCOR) 20 MG tablet Take 20  mg by mouth daily.    [provider]  simvastatin (ZOCOR) 20 MG tablet TAKE 1 TABLET BY MOUTH NIGHTLY 07/19/20 07/19/21  Bobbye Charleston, MD  tiZANidine (ZANAFLEX) 4 MG capsule 1 tablet at night 07/29/19   Lyndal Pulley, DO  traZODone (DESYREL) 50 MG tablet Take 0.5-1 tablets (25-50 mg total) by mouth at bedtime as needed for sleep. 05/11/20   Lyndal Pulley, DO  traZODone (DESYREL) 50 MG tablet TAKE 1/2-1 TABLET (25-50 MG TOTAL) BY MOUTH AT BEDTIME AS NEEDED FOR SLEEP. 09/09/20 09/09/21  Lyndal Pulley, DO  Vitamin D, Ergocalciferol, (DRISDOL) 1.25 MG (50000 UNIT) CAPS capsule Take 1 capsule (50,000 Units total) by mouth  every 7 (seven) days. 05/11/20   Lyndal Pulley, DO     Positive ROS: Otherwise negative  All other systems have been reviewed and were otherwise negative with the exception of those mentioned in the HPI and as above.  Physical Exam: Constitutional: Alert, well-appearing, no acute distress Ears: External ears without lesions or tenderness. Ear canals with a large amount of wax on the left side with small ear canals bilaterally.  Small amount of wax on the right side.  Left ear canal was cleaned with suction and curettes.  The right side was cleaned with curettes only.  Ear canals are small bilaterally.  TMs were clear bilaterally.. Nasal: External nose without lesions. Clear nasal passages Oral: Oropharynx clear. Neck: No palpable adenopathy or masses Respiratory: Breathing comfortably  Skin: No facial/neck lesions or rash noted.  Cerumen impaction removal  Date/Time: 03/11/2021 3:53 PM Performed by: Rozetta Nunnery, MD Authorized by: Rozetta Nunnery, MD   Consent:    Consent obtained:  Verbal   Consent given by:  Patient   Risks discussed:  Pain and bleeding Procedure details:    Location:  L ear and R ear   Procedure type: curette and suction   Post-procedure details:    Inspection:  TM intact and canal normal   Hearing quality:  Improved   Patient tolerance of procedure:  Tolerated well, no immediate complications Comments:     TMs are clear bilaterally.    Assessment: Cerumen impactions worse on the left side  Plan: This was cleaned in the office. He will follow-up as needed.  Radene Journey, MD

## 2021-03-22 MED FILL — Simvastatin Tab 20 MG: ORAL | 90 days supply | Qty: 90 | Fill #0 | Status: AC

## 2021-03-22 MED FILL — Lisinopril Tab 10 MG: ORAL | 90 days supply | Qty: 90 | Fill #0 | Status: AC

## 2021-03-23 ENCOUNTER — Other Ambulatory Visit (HOSPITAL_COMMUNITY): Payer: Self-pay

## 2021-04-12 MED FILL — Hydrochlorothiazide Cap 12.5 MG: ORAL | 90 days supply | Qty: 90 | Fill #0 | Status: AC

## 2021-04-13 ENCOUNTER — Other Ambulatory Visit (HOSPITAL_COMMUNITY): Payer: Self-pay

## 2021-04-26 NOTE — Progress Notes (Signed)
[place] Phone: [phone] Subjective:   [name], am serving as a scribe for Dr. Hulan Saas. This visit occurred during the SARS-CoV-2 public health emergency.  Safety protocols were in place, including screening questions prior to the visit, additional usage of staff PPE, and extensive cleaning of exam room while observing appropriate contact time as indicated for disinfecting solutions.   I'm seeing this patient by the request  of:  Rice, Wayland Denis, MD  CC: low back pain f/u  [id number]  DAVEN MONTZ is a 60 y.o. male coming in with complaint of back and neck pain. OMT 03/10/2021. Patient states that his pain is the same as last visit.  Patient has been doing relatively well overall.  Does feel that manipulation will be helpful.  Seems to be more in the lower back area  Medications patient has been prescribed: Hydroxizine  Taking:         Reviewed prior external information including notes and imaging from previsou exam, outside providers and external EMR if available.   As well as notes that were available from care everywhere and other healthcare systems.  Past medical history, social, surgical and family history all reviewed in electronic medical record.  No pertanent information unless stated regarding to the chief complaint.   Past Medical History:  Diagnosis Date  . High cholesterol   . Hyperlipidemia   . Hypertension     No Known Allergies   Review of Systems:  No headache, visual changes, nausea, vomiting, diarrhea, constipation, dizziness, abdominal pain, skin rash, fevers, chills, night sweats, weight loss, swollen lymph nodes, body aches, joint swelling, chest pain, shortness of breath, mood changes. POSITIVE muscle aches but mild  Objective  Blood pressure 110/72, pulse 79, height 6\' 1"  (1.854 m), weight 180 lb (81.6 kg), SpO2 99 %.   General: No apparent  distress alert and oriented x3 mood and affect normal, dressed appropriately.  HEENT: Pupils equal, extraocular movements intact  Respiratory: Patient's speak in full sentences and does not appear short of breath  Cardiovascular: No lower extremity edema, non tender, no erythema  Gait normal with good balance and coordination.  MSK:  Non tender with full range of motion and good stability and symmetric strength and tone of shoulders, elbows, wrist, hip, knee and ankles bilaterally.  Back -low back exam does have some mild loss of lordosis.  Patient does have tightness noted on the right side of the paraspinal musculature.  Mild pain in the right parascapular region as well noted.  Negative straight leg test.  Mild tightness with Corky Sox on the right compared to left  Osteopathic findings  C2 flexed rotated and side bent right T3 extended rotated and side bent right inhaled rib T9 extended rotated and side bent left L2 flexed rotated and side bent right Sacrum right on right       Assessment and Plan:  Right lumbar radiculitis Patient is doing relatively well overall.  Discussed posture and ergonomics.  Patient is continuing with the same regimen.  Not taking any true pain medications at the moment.  Follow-up with me again in 6 weeks    Nonallopathic problems  Decision today to treat with OMT was based on Physical Exam  After verbal consent patient was treated with HVLA, ME, FPR techniques in cervical, thoracic, lumbar, and sacral  areas  Patient tolerated the procedure well with improvement in symptoms  Patient given exercises, stretches and  lifestyle modifications  See medications in patient instructions if given  Patient will follow up in 4-8 weeks      The above documentation has been reviewed and is accurate and complete Lyndal Pulley, DO       Note: This dictation was prepared with Dragon dictation along with smaller phrase technology. Any transcriptional errors  that result from this process are unintentional.

## 2021-04-27 ENCOUNTER — Ambulatory Visit: Payer: 59 | Admitting: Family Medicine

## 2021-04-27 ENCOUNTER — Other Ambulatory Visit: Payer: Self-pay

## 2021-04-27 ENCOUNTER — Encounter: Payer: Self-pay | Admitting: Family Medicine

## 2021-04-27 VITALS — BP 110/72 | HR 79 | Ht 73.0 in | Wt 180.0 lb

## 2021-04-27 DIAGNOSIS — M9902 Segmental and somatic dysfunction of thoracic region: Secondary | ICD-10-CM | POA: Diagnosis not present

## 2021-04-27 DIAGNOSIS — M9901 Segmental and somatic dysfunction of cervical region: Secondary | ICD-10-CM | POA: Diagnosis not present

## 2021-04-27 DIAGNOSIS — M5416 Radiculopathy, lumbar region: Secondary | ICD-10-CM | POA: Diagnosis not present

## 2021-04-27 DIAGNOSIS — M9904 Segmental and somatic dysfunction of sacral region: Secondary | ICD-10-CM

## 2021-04-27 DIAGNOSIS — M9903 Segmental and somatic dysfunction of lumbar region: Secondary | ICD-10-CM

## 2021-04-27 NOTE — Patient Instructions (Signed)
Stay active Per se Have fun in [place]  See me in 6 weeks

## 2021-04-27 NOTE — Assessment & Plan Note (Signed)
Patient is doing relatively well overall.  Discussed posture and ergonomics.  Patient is continuing with the same regimen.  Not taking any true pain medications at the moment.  Follow-up with me again in 6 weeks

## 2021-06-10 NOTE — Progress Notes (Signed)
  [name] [place] Phone: [phone] Subjective:   Larry [name], am serving as a scribe for Dr. Hulan Saas.  I'm seeing this patient by the request  of:  Rice, Wayland Denis, MD  CC: Back pain and shoulder pain follow-up  [id number]  Larry [name] is a 60 y.o. male coming in with complaint of back and neck pain. OMT 04/27/2021. Patient states no new concerns just ready for OMT patient has been somewhat active.  Has been traveling and going to [place] recently.  States that it was filling.  No significant worsening pain during that time  Medications patient has been prescribed: Atarax  Taking:         Reviewed prior external information including notes and imaging from previsou exam, outside providers and external EMR if available.   As well as notes that were available from care everywhere and other healthcare systems.  Past medical history, social, surgical and family history all reviewed in electronic medical record.  No pertanent information unless stated regarding to the chief complaint.   Past Medical History:  Diagnosis Date   High cholesterol    Hyperlipidemia    Hypertension     No Known Allergies   Review of Systems:  No headache, visual changes, nausea, vomiting, diarrhea, constipation, dizziness, abdominal pain, skin rash, fevers, chills, night sweats, weight loss, swollen lymph nodes, body aches, joint swelling, chest pain, shortness of breath, mood changes. POSITIVE muscle aches  Objective  Blood pressure 110/82, pulse 67, height 6\' 1"  (1.854 m), weight 180 lb (81.6 kg), SpO2 98 %.   General: No apparent distress alert and oriented x3 mood and affect normal, dressed appropriately.  HEENT: Pupils equal, extraocular movements intact  Respiratory: Patient's speak in full sentences and does not appear short of breath  Cardiovascular: No lower extremity edema, non tender, no erythema   Low back exam does have some mild loss of lordosis.  Some tenderness to palpation in the paraspinal musculature.  Tightness noted with FABER test right greater than left.  Negative straight leg test.  Osteopathic findings  C7 flexed rotated and side bent right T9 extended rotated and side bent left L2 flexed rotated and side bent right Sacrum right on right       Assessment and Plan:  Right lumbar radiculitis Patient doing relatively well but did have some tightness in the lumbar spine.  Still responding extremely well to osteopathic manipulation.  Discussed posture and ergonomics when necessary.  Continue to stay active and work on core strength.  Follow-up again in 6 to 8 weeks   Nonallopathic problems  Decision today to treat with OMT was based on Physical Exam  After verbal consent patient was treated with HVLA, ME, FPR techniques in cervical,  thoracic, lumbar, and sacral  areas  Patient tolerated the procedure well with improvement in symptoms  Patient given exercises, stretches and lifestyle modifications  See medications in patient instructions if given  Patient will follow up in 6-8 weeks      The above documentation has been reviewed and is accurate and complete Lyndal Pulley, DO        Note: This dictation was prepared with Dragon dictation along with smaller phrase technology. Any transcriptional errors that result from this process are unintentional.

## 2021-06-14 ENCOUNTER — Ambulatory Visit: Payer: 59 | Admitting: Family Medicine

## 2021-06-17 ENCOUNTER — Other Ambulatory Visit: Payer: Self-pay

## 2021-06-17 ENCOUNTER — Encounter: Payer: Self-pay | Admitting: Family Medicine

## 2021-06-17 ENCOUNTER — Ambulatory Visit: Payer: 59 | Admitting: Family Medicine

## 2021-06-17 VITALS — BP 110/82 | HR 67 | Ht 73.0 in | Wt 180.0 lb

## 2021-06-17 DIAGNOSIS — M9904 Segmental and somatic dysfunction of sacral region: Secondary | ICD-10-CM

## 2021-06-17 DIAGNOSIS — M9901 Segmental and somatic dysfunction of cervical region: Secondary | ICD-10-CM

## 2021-06-17 DIAGNOSIS — M9903 Segmental and somatic dysfunction of lumbar region: Secondary | ICD-10-CM | POA: Diagnosis not present

## 2021-06-17 DIAGNOSIS — M9902 Segmental and somatic dysfunction of thoracic region: Secondary | ICD-10-CM | POA: Diagnosis not present

## 2021-06-17 DIAGNOSIS — M5416 Radiculopathy, lumbar region: Secondary | ICD-10-CM

## 2021-06-17 NOTE — Patient Instructions (Addendum)
Good to see you  Tuned you up Have dessert for me See me again in 6-8 weeks

## 2021-06-17 NOTE — Assessment & Plan Note (Signed)
Patient doing relatively well but did have some tightness in the lumbar spine.  Still responding extremely well to osteopathic manipulation.  Discussed posture and ergonomics when necessary.  Continue to stay active and work on core strength.  Follow-up again in 6 to 8 weeks

## 2021-07-20 ENCOUNTER — Other Ambulatory Visit (HOSPITAL_COMMUNITY): Payer: Self-pay

## 2021-07-21 ENCOUNTER — Other Ambulatory Visit (HOSPITAL_COMMUNITY): Payer: Self-pay

## 2021-07-21 MED ORDER — SIMVASTATIN 20 MG PO TABS
ORAL_TABLET | ORAL | 3 refills | Status: DC
  Filled 2021-07-21: qty 90, 90d supply, fill #0
  Filled 2021-11-11: qty 90, 90d supply, fill #1

## 2021-07-22 ENCOUNTER — Other Ambulatory Visit (HOSPITAL_COMMUNITY): Payer: Self-pay

## 2021-07-28 NOTE — Progress Notes (Signed)
  Larry [name] Phone: [phone] Subjective:   IVilma [name], am serving as a scribe for Dr. Hulan [name]. This visit occurred during the SARS-CoV-2 public health emergency.  Safety protocols were in place, including screening questions prior to the visit, additional usage of staff PPE, and extensive cleaning of exam room while observing appropriate contact time as indicated for disinfecting solutions.   I'm seeing this patient by the request  of:  Rice, Wayland Denis, MD  CC: Neck and back pain follow-up  [id number]  Larry [name] is a 60 y.o. male coming in with complaint of back and neck pain. OMT 06/17/2021.  Patient stated that overall has been doing relatively well.  Has noted some mild tightness more on the right side of the back.  No radicular symptoms.  Not stopping him from activity.  Has been traveling a lot recently.  Medications patient has been prescribed: Atarax          Past Medical History:  Diagnosis Date   High cholesterol    Hyperlipidemia    Hypertension     No Known Allergies   Review of Systems:  No headache, visual changes, nausea, vomiting, diarrhea, constipation, dizziness, abdominal pain, skin rash, fevers, chills, night sweats, weight loss, swollen lymph nodes, body aches, joint swelling, chest pain, shortness of breath, mood changes. POSITIVE muscle aches  Objective  Blood pressure 130/82, pulse 75, height '6\' 1"'$  (1.854 m), weight 180 lb (81.6 kg), SpO2 99 %.   General: No apparent distress alert and oriented x3 mood and affect normal, dressed appropriately.  HEENT: Pupils equal, extraocular movements intact  Respiratory: Patient's speak in full sentences and does not appear short of breath  Cardiovascular: No lower extremity edema, non tender, no erythema  Back exam does have some loss of lordosis.  Some tenderness to palpation more on the right side.  Mild increase in  tightness of the right hip flexor.  Negative straight leg test.  5 out of 5 strength in lower extremities Osteopathic findings  C2 flexed rotated and side bent right C6 flexed rotated and side bent left T9 extended rotated and side bent left L2 flexed rotated and side bent right L4 flexed rotated and side bent right Sacrum right on right       Assessment and Plan:  Right lumbar radiculitis Increased tightness of the right hip flexor.  Discussed with patient icing regimen and home exercises.  Patient has been doing well.  I do think it is more secondary to positioning.  Patient was on a plane for a long amount of time.  No changes in medications at this point.  Follow-up with me again 6 to 8 weeks   Nonallopathic problems  Decision today to treat with OMT was based on Physical Exam  After verbal consent patient was treated with HVLA, ME, FPR techniques in cervical,  thoracic, lumbar, and sacral  areas  Patient tolerated the procedure well with improvement in symptoms  Patient given exercises, stretches and lifestyle modifications  See medications in patient instructions if given  Patient will follow up in 4-8 weeks      The above documentation has been reviewed and is accurate and complete Lyndal Pulley, DO       Note: This dictation was prepared with Dragon dictation along with smaller phrase technology. Any transcriptional errors that result from this process are unintentional.

## 2021-07-29 ENCOUNTER — Ambulatory Visit: Payer: 59 | Admitting: Family Medicine

## 2021-07-29 ENCOUNTER — Encounter: Payer: Self-pay | Admitting: Family Medicine

## 2021-07-29 ENCOUNTER — Other Ambulatory Visit: Payer: Self-pay

## 2021-07-29 VITALS — BP 130/82 | HR 75 | Ht 73.0 in | Wt 180.0 lb

## 2021-07-29 DIAGNOSIS — I10 Essential (primary) hypertension: Secondary | ICD-10-CM | POA: Diagnosis not present

## 2021-07-29 DIAGNOSIS — E785 Hyperlipidemia, unspecified: Secondary | ICD-10-CM | POA: Diagnosis not present

## 2021-07-29 DIAGNOSIS — M9902 Segmental and somatic dysfunction of thoracic region: Secondary | ICD-10-CM | POA: Diagnosis not present

## 2021-07-29 DIAGNOSIS — M5416 Radiculopathy, lumbar region: Secondary | ICD-10-CM

## 2021-07-29 DIAGNOSIS — Z0000 Encounter for general adult medical examination without abnormal findings: Secondary | ICD-10-CM | POA: Diagnosis not present

## 2021-07-29 DIAGNOSIS — M9904 Segmental and somatic dysfunction of sacral region: Secondary | ICD-10-CM

## 2021-07-29 DIAGNOSIS — M9901 Segmental and somatic dysfunction of cervical region: Secondary | ICD-10-CM

## 2021-07-29 DIAGNOSIS — M9903 Segmental and somatic dysfunction of lumbar region: Secondary | ICD-10-CM | POA: Diagnosis not present

## 2021-07-29 DIAGNOSIS — F419 Anxiety disorder, unspecified: Secondary | ICD-10-CM | POA: Diagnosis not present

## 2021-07-29 NOTE — Patient Instructions (Signed)
Great to see you! Have fun at the game!! See you again in 6-8 weeks

## 2021-07-29 NOTE — Assessment & Plan Note (Signed)
Increased tightness of the right hip flexor.  Discussed with patient icing regimen and home exercises.  Patient has been doing well.  I do think it is more secondary to positioning.  Patient was on a plane for a long amount of time.  No changes in medications at this point.  Follow-up with me again 6 to 8 weeks

## 2021-08-17 ENCOUNTER — Other Ambulatory Visit (HOSPITAL_COMMUNITY): Payer: Self-pay

## 2021-08-17 MED ORDER — HYDROCHLOROTHIAZIDE 12.5 MG PO CAPS
ORAL_CAPSULE | ORAL | 3 refills | Status: DC
  Filled 2021-08-17: qty 90, 90d supply, fill #0

## 2021-08-17 MED ORDER — LISINOPRIL 10 MG PO TABS
10.0000 mg | ORAL_TABLET | Freq: Every day | ORAL | 3 refills | Status: DC
  Filled 2021-08-17: qty 90, 90d supply, fill #0

## 2021-08-17 MED ORDER — ALPRAZOLAM 0.5 MG PO TABS
ORAL_TABLET | ORAL | 3 refills | Status: DC
  Filled 2021-08-17: qty 30, 10d supply, fill #0

## 2021-08-18 IMAGING — DX DG SHOULDER 2+V*L*
3 series · 3 of 3 positions shown · non-contrast
Comparison: No pertinent prior exams are available for comparison.

CLINICAL DATA: Chronic left shoulder pain.

EXAM:
LEFT SHOULDER - 2+ VIEW

[shoulder ap (1 of 2)]
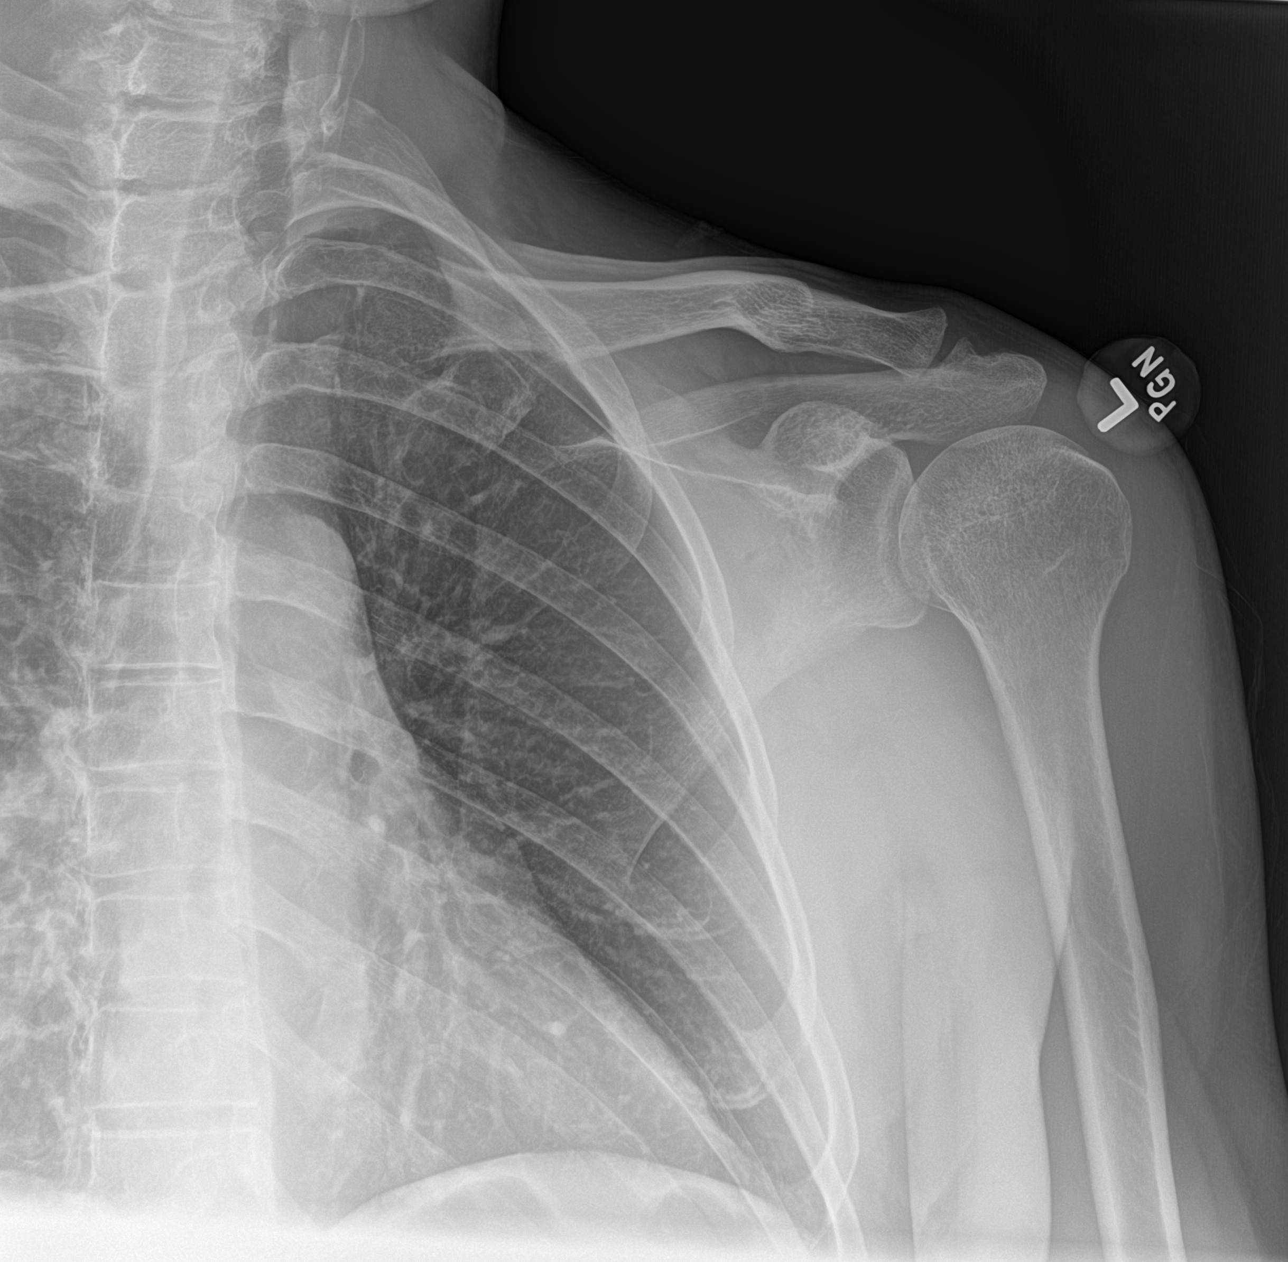

[shoulder ap (2 of 2)]
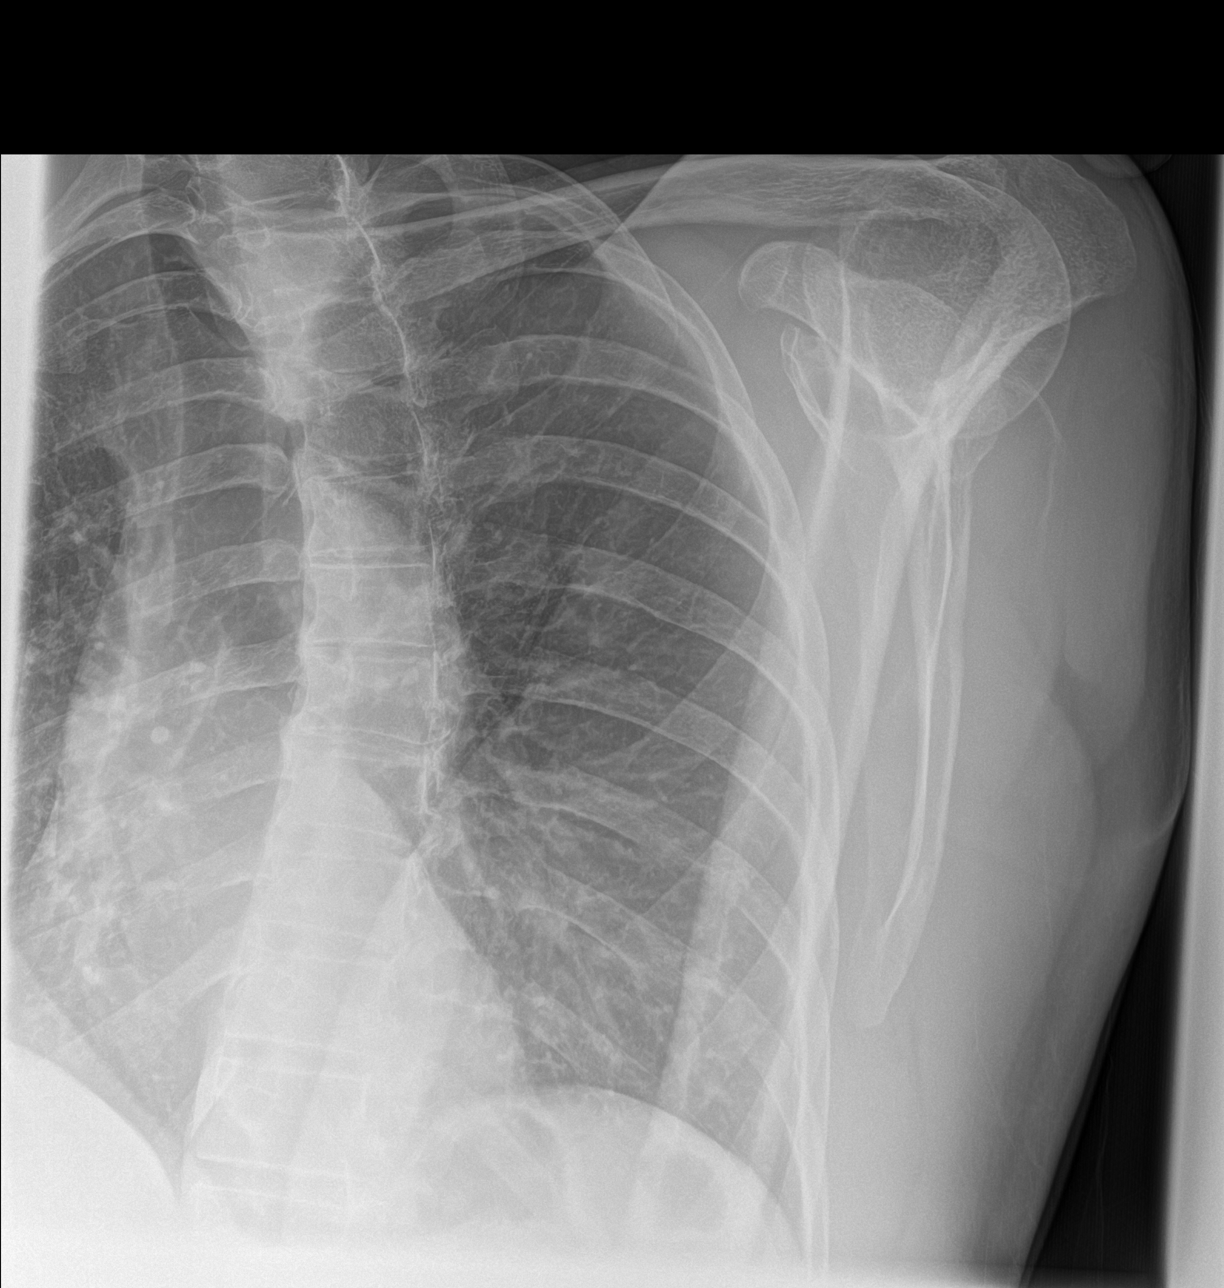

[shoulder axial]
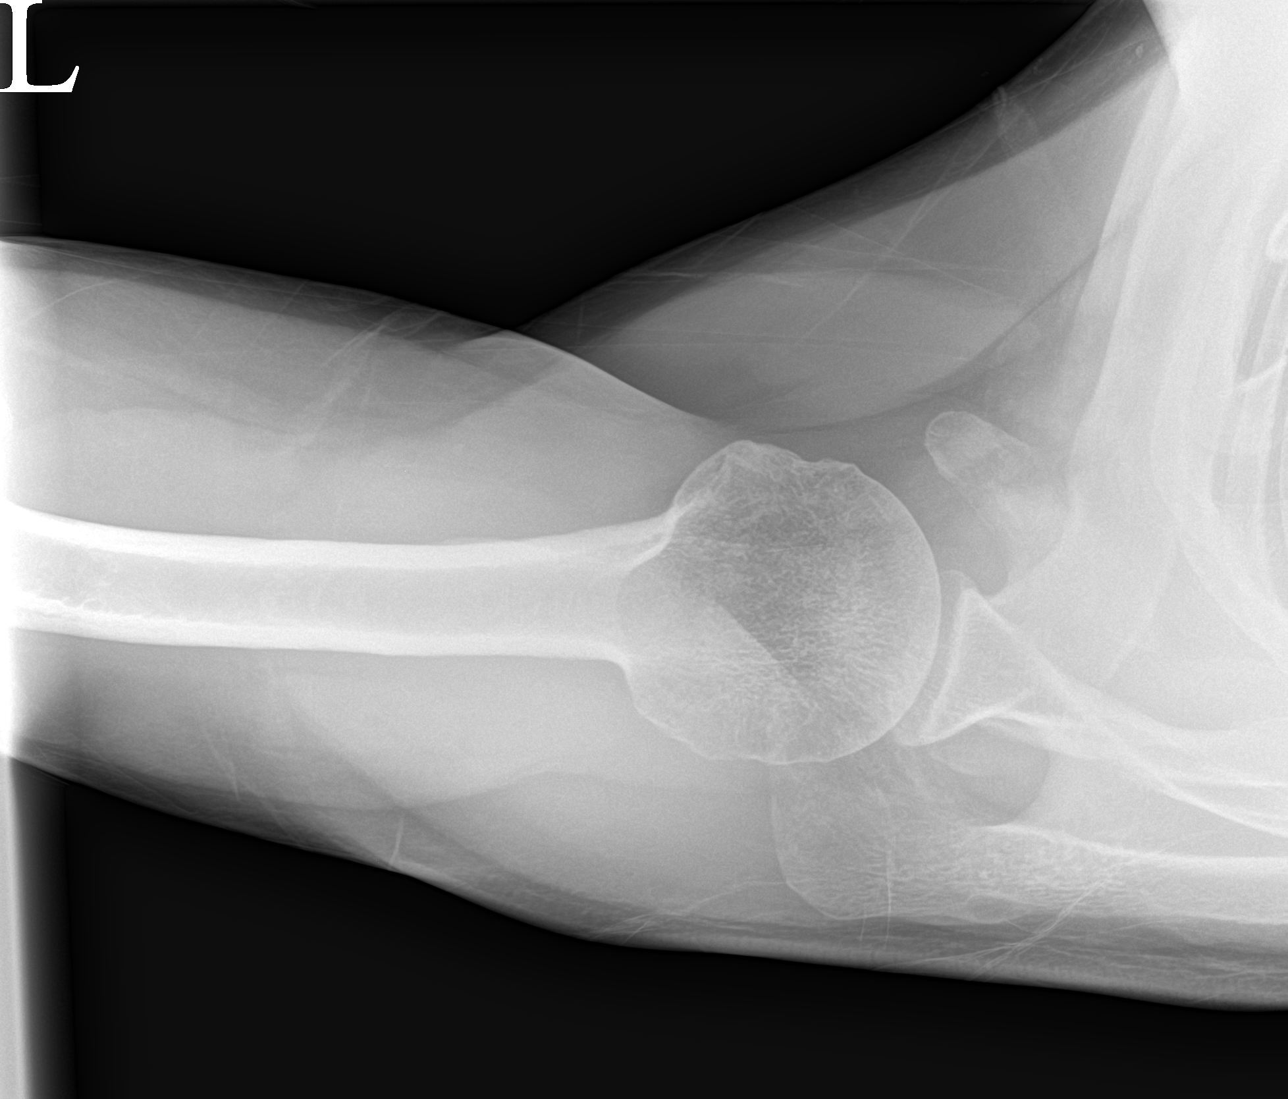

[3 of 3 positions shown; findings below may reference images not displayed]

FINDINGS: There is normal bony alignment.

No evidence of acute osseous or articular abnormality.

There are mild degenerative changes of the acromioclavicular joint.
IMPRESSION: No evidence of acute osseous or articular abnormality.

Mild degenerative changes of the acromioclavicular joint.

## 2021-08-25 DIAGNOSIS — H00024 Hordeolum internum left upper eyelid: Secondary | ICD-10-CM | POA: Diagnosis not present

## 2021-09-14 NOTE — Progress Notes (Deleted)
  [place] Phone: [phone] Subjective:    I'm seeing this patient by the request  of:  Rice, Wayland Denis, MD  CC:   [id number]  ENDY EASTERLY is a 60 y.o. male coming in with complaint of back and neck pain. OMT 07/29/2021. Patient states   Medications patient has been prescribed: Hydroxizine  Taking:         Reviewed prior external information including notes and imaging from previsou exam, outside providers and external EMR if available.   As well as notes that were available from care everywhere and other healthcare systems.  Past medical history, social, surgical and family history all reviewed in electronic medical record.  No pertanent information unless stated regarding to the chief complaint.   Past Medical History:  Diagnosis Date   High cholesterol    Hyperlipidemia    Hypertension     No Known Allergies   Review of Systems:  No headache, visual changes, nausea, vomiting, diarrhea, constipation, dizziness, abdominal pain, skin rash, fevers, chills, night sweats, weight loss, swollen lymph nodes, body aches, joint swelling, chest pain, shortness of breath, mood changes. POSITIVE muscle aches  Objective  There were no vitals taken for this visit.   General: No apparent distress alert and oriented x3 mood and affect normal, dressed appropriately.  HEENT: Pupils equal, extraocular movements intact  Respiratory: Patient's speak in full sentences and does not appear short of breath  Cardiovascular: No lower extremity edema, non tender, no erythema  Neuro: Cranial nerves II through XII are intact, neurovascularly intact in all extremities with 2+ DTRs and 2+ pulses.  Gait normal with good balance and coordination.  MSK:  Non tender with full range of motion and good stability and symmetric strength and tone of shoulders, elbows, wrist, hip, knee and ankles bilaterally.  Back - Normal  skin, Spine with normal alignment and no deformity.  No tenderness to vertebral process palpation.  Paraspinous muscles are not tender and without spasm.   Range of motion is full at neck and lumbar sacral regions  Osteopathic findings  C2 flexed rotated and side bent right C6 flexed rotated and side bent left T3 extended rotated and side bent right inhaled rib T9 extended rotated and side bent left L2 flexed rotated and side bent right Sacrum right on right       Assessment and Plan:    Nonallopathic problems  Decision today to treat with OMT was based on Physical Exam  After verbal consent patient was treated with HVLA, ME, FPR techniques in cervical, rib, thoracic, lumbar, and sacral  areas  Patient tolerated the procedure well with improvement in symptoms  Patient given exercises, stretches and lifestyle modifications  See medications in patient instructions if given  Patient will follow up in 4-8 weeks      The above documentation has been reviewed and is accurate and complete Jacqualin Combes       Note: This dictation was prepared with Dragon dictation along with smaller phrase technology. Any transcriptional errors that result from this process are unintentional.

## 2021-09-16 ENCOUNTER — Ambulatory Visit: Payer: 59 | Admitting: Family Medicine

## 2021-10-12 ENCOUNTER — Other Ambulatory Visit (HOSPITAL_COMMUNITY): Payer: Self-pay

## 2021-10-12 ENCOUNTER — Telehealth: Payer: 59 | Admitting: Physician Assistant

## 2021-10-12 DIAGNOSIS — J069 Acute upper respiratory infection, unspecified: Secondary | ICD-10-CM

## 2021-10-12 MED ORDER — FLUTICASONE PROPIONATE 50 MCG/ACT NA SUSP
2.0000 | Freq: Every day | NASAL | 0 refills | Status: AC
Start: 2021-10-12 — End: ?
  Filled 2021-10-12: qty 16, 30d supply, fill #0

## 2021-10-12 MED ORDER — BENZONATATE 100 MG PO CAPS
100.0000 mg | ORAL_CAPSULE | Freq: Three times a day (TID) | ORAL | 0 refills | Status: DC | PRN
  Filled 2021-10-12: qty 30, 10d supply, fill #0

## 2021-10-12 NOTE — Progress Notes (Signed)
Virtual Visit Consent   Larry [name], you are scheduled for a virtual visit with a [facility] provider today.     Just as with appointments in the office, your consent must be obtained to participate.  Your consent will be active for this visit and any virtual visit you may have with one of our providers in the next 365 days.     If you have a MyChart account, a copy of this consent can be sent to you electronically.  All virtual visits are billed to your insurance company just like a traditional visit in the office.    As this is a virtual visit, video technology does not allow for your provider to perform a traditional examination.  This may limit your provider's ability to fully assess your condition.  If your provider identifies any concerns that need to be evaluated in person or the need to arrange testing (such as labs, EKG, etc.), we will make arrangements to do so.     Although advances in technology are sophisticated, we cannot ensure that it will always work on either your end or our end.  If the connection with a video visit is poor, the visit may have to be switched to a telephone visit.  With either a video or telephone visit, we are not always able to ensure that we have a secure connection.     I need to obtain your verbal consent now.   Are you willing to proceed with your visit today?    Larry [name] has provided verbal consent on 10/12/2021 for a virtual visit (video or telephone).   Leeanne Rio, [place]   Date: 10/12/2021 11:20 AM   Virtual Visit via Video Note   I, Leeanne Rio, connected with  Larry [name]  ([id number], 07-Jul-1961) on 10/12/21 at 11:15 AM EST by a video-enabled telemedicine application and verified that I am speaking with the correct person using two identifiers.  Location: Patient: Work Provider: Ecologist: Home Office   I discussed the limitations of evaluation and management by  telemedicine and the availability of in person appointments. The patient expressed understanding and agreed to proceed.    History of Present Illness: Larry [name] is a 60 y.o. who identifies as a male who was assigned male at birth, and is being seen today for URI symptoms starting within the past 72 hours. Notes the end of last week/weekend he was at a conference. Symptoms starting on return home with nasal congestion, scratch throat and mild cough. Notes some chest congestion now but denies any chest pain, chest tightness, wheezing or SOB. Denies sinus pain, ear pain or tooth pain. Took an at-home COVID test last night which was negative.   HPI: HPI  Problems:  Patient Active Problem List   Diagnosis Date Noted   Left rotator cuff tear 05/11/2020   Nonallopathic lesion of thoracic region 07/09/2018   Nonallopathic lesion of cervical region 07/09/2018   Nonallopathic lesion of lumbar region 07/09/2018   Nonallopathic lesion of sacral region 07/09/2018   Right rotator cuff tendinitis 04/12/2017   Right lumbar radiculitis 09/29/2014   Foot pain, left 09/08/2014   HYPERLIPIDEMIA 10/09/2010    Allergies: No Known Allergies Medications:  Current Outpatient Medications:    ALPRAZolam (XANAX) 0.5 MG tablet, Take 1 tablet by mouth every 6 to 8 hours if needed for anxiety, Disp: 30 tablet, Rfl: 3   candesartan-hydrochlorothiazide (ATACAND HCT) 32-12.5 MG tablet, Take 1 tablet  by mouth daily., Disp: , Rfl:    lisinopril (ZESTRIL) 10 MG tablet, TAKE 1 TABLET BY MOUTH DAILY FOR BLOOD PRESSURE, Disp: 90 tablet, Rfl: 3   simvastatin (ZOCOR) 20 MG tablet, TAKE 1 TABLET BY MOUTH NIGHTLY, Disp: 90 tablet, Rfl: 3   simvastatin (ZOCOR) 20 MG tablet, Take 1 tablet (20 mg total) by mouth nightly., Disp: 90 tablet, Rfl: 3   tiZANidine (ZANAFLEX) 4 MG capsule, 1 tablet at night, Disp: 30 capsule, Rfl: 0   traZODone (DESYREL) 50 MG tablet, TAKE 1/2-1 TABLET (25-50 MG TOTAL) BY MOUTH AT BEDTIME AS  NEEDED FOR SLEEP. (Patient not taking: Reported on 06/17/2021), Disp: 30 tablet, Rfl: 3   Vitamin D, Ergocalciferol, (DRISDOL) 1.25 MG (50000 UNIT) CAPS capsule, Take 1 capsule (50,000 Units total) by mouth every 7 (seven) days. (Patient not taking: Reported on 06/17/2021), Disp: 12 capsule, Rfl: 0  Observations/Objective: Patient is well-developed, well-nourished in no acute distress.  Resting comfortably at home.  Head is normocephalic, atraumatic.  No labored breathing Speech is clear and coherent with logical content.  Patient is alert and oriented at baseline.   Assessment and Plan: 1. Viral URI with cough COVID negative. Milder symptoms. Does not seem consistent with influenza. Recommend one repeat COVID test 24-36 from last test just to verify negative. Seems more like a run-of-the-mill seasonal viral URI. Supportive measures, OTC medications and vitamin recommendations reviewed. Tessalon and Flonase per orders. Strict return precautions discussed with patient who voiced understanding and agreement. Declines needing work note at present.   Follow Up Instructions: I discussed the assessment and treatment plan with the patient. The patient was provided an opportunity to ask questions and all were answered. The patient agreed with the plan and demonstrated an understanding of the instructions.  A copy of instructions were sent to the patient via MyChart unless otherwise noted below.   The patient was advised to call back or seek an in-person evaluation if the symptoms worsen or if the condition fails to improve as anticipated.  Time:  I spent 12 minutes with the patient via telehealth technology discussing the above problems/concerns.    Leeanne Rio, PA-C

## 2021-10-12 NOTE — Patient Instructions (Signed)
Larry [name], thank you for joining Leeanne Rio, PA-C for today's virtual visit.  While this provider is not your primary care provider (PCP), if your PCP is located in our provider database this encounter information will be shared with them immediately following your visit.  Consent: (Patient) Larry [name] provided verbal consent for this virtual visit at the beginning of the encounter.  Current Medications:  Current Outpatient Medications:    benzonatate (TESSALON) 100 MG capsule, Take 1 capsule (100 mg total) by mouth 3 (three) times daily as needed for cough., Disp: 30 capsule, Rfl: 0   fluticasone (FLONASE) 50 MCG/ACT nasal spray, Place 2 sprays into both nostrils daily., Disp: 16 g, Rfl: 0   ALPRAZolam (XANAX) 0.5 MG tablet, Take 1 tablet by mouth every 6 to 8 hours if needed for anxiety, Disp: 30 tablet, Rfl: 3   candesartan-hydrochlorothiazide (ATACAND HCT) 32-12.5 MG tablet, Take 1 tablet by mouth daily., Disp: , Rfl:    lisinopril (ZESTRIL) 10 MG tablet, TAKE 1 TABLET BY MOUTH DAILY FOR BLOOD PRESSURE, Disp: 90 tablet, Rfl: 3   simvastatin (ZOCOR) 20 MG tablet, TAKE 1 TABLET BY MOUTH NIGHTLY, Disp: 90 tablet, Rfl: 3   simvastatin (ZOCOR) 20 MG tablet, Take 1 tablet (20 mg total) by mouth nightly., Disp: 90 tablet, Rfl: 3   tiZANidine (ZANAFLEX) 4 MG capsule, 1 tablet at night, Disp: 30 capsule, Rfl: 0   traZODone (DESYREL) 50 MG tablet, TAKE 1/2-1 TABLET (25-50 MG TOTAL) BY MOUTH AT BEDTIME AS NEEDED FOR SLEEP. (Patient not taking: Reported on 06/17/2021), Disp: 30 tablet, Rfl: 3   Vitamin D, Ergocalciferol, (DRISDOL) 1.25 MG (50000 UNIT) CAPS capsule, Take 1 capsule (50,000 Units total) by mouth every 7 (seven) days. (Patient not taking: Reported on 06/17/2021), Disp: 12 capsule, Rfl: 0   Medications ordered in this encounter:  Meds ordered this encounter  Medications   benzonatate (TESSALON) 100 MG capsule    Sig: Take 1 capsule (100 mg total) by mouth 3  (three) times daily as needed for cough.    Dispense:  30 capsule    Refill:  0    Order Specific Question:   Supervising Provider    Answer:   MILLER, BRIAN [[id number]]   fluticasone (FLONASE) 50 MCG/ACT nasal spray    Sig: Place 2 sprays into both nostrils daily.    Dispense:  16 g    Refill:  0    Order Specific Question:   Supervising Provider    Answer:   Sabra Heck, [place]     *If you need refills on other medications prior to your next appointment, please contact your pharmacy*  Follow-Up: Call back or seek an in-person evaluation if the symptoms worsen or if the condition fails to improve as anticipated.  Other Instructions E-Visit for Upper Respiratory Infection   We are sorry you are not feeling well.  Here is how we plan to help!  Based on what you have shared with me, it looks like you may have a viral upper respiratory infection.  Upper respiratory infections are caused by a large number of viruses; however, rhinovirus is the most common cause.   Symptoms vary from person to person, with common symptoms including sore throat, cough, fatigue or lack of energy and feeling of general discomfort.  A low-grade fever of up to 100.4 may present, but is often uncommon.  Symptoms vary however, and are closely related to a person's age or underlying illnesses.  The most common symptoms  associated with an upper respiratory infection are nasal discharge or congestion, cough, sneezing, headache and pressure in the ears and face.  These symptoms usually persist for about 3 to 10 days, but can last up to 2 weeks.  It is important to know that upper respiratory infections do not cause serious illness or complications in most cases.    Upper respiratory infections can be transmitted from person to person, with the most common method of transmission being a person's hands.  The virus is able to live on the skin and can infect other persons for up to 2 hours after direct contact.  Also, these can  be transmitted when someone coughs or sneezes; thus, it is important to cover the mouth to reduce this risk.  To keep the spread of the illness at [place], good hand hygiene is very important.  This is an infection that is most likely caused by a virus. There are no specific treatments other than to help you with the symptoms until the infection runs its course.  We are sorry you are not feeling well.  Here is how we plan to help!   For nasal congestion, you may use an oral decongestants such as Mucinex D or if you have glaucoma or high blood pressure use plain Mucinex.  Saline nasal spray or nasal drops can help and can safely be used as often as needed for congestion.  For your congestion, I have prescribed Fluticasone nasal spray one spray in each nostril twice a day  If you do not have a history of heart disease, hypertension, diabetes or thyroid disease, prostate/bladder issues or glaucoma, you may also use Sudafed to treat nasal congestion.  It is highly recommended that you consult with a pharmacist or your primary care physician to ensure this medication is safe for you to take.     If you have a cough, you may use cough suppressants such as Delsym and Robitussin.  If you have glaucoma or high blood pressure, you can also use Coricidin HBP.   For cough I have prescribed for you A prescription cough medication called Tessalon Perles 100 mg. You may take 1-2 capsules every 8 hours as needed for cough  If you have a sore or scratchy throat, use a saltwater gargle-  to  teaspoon of salt dissolved in a 4-ounce to 8-ounce glass of warm water.  Gargle the solution for approximately 15-30 seconds and then spit.  It is important not to swallow the solution.  You can also use throat lozenges/cough drops and Chloraseptic spray to help with throat pain or discomfort.  Warm or cold liquids can also be helpful in relieving throat pain.  For headache, pain or general discomfort, you can use Ibuprofen or Tylenol  as directed.   Some authorities believe that zinc sprays or the use of Echinacea may shorten the course of your symptoms.   HOME CARE Only take medications as instructed by your medical team. Be sure to drink plenty of fluids. Water is fine as well as fruit juices, sodas and electrolyte beverages. You may want to stay away from caffeine or alcohol. If you are nauseated, try taking small sips of liquids. How do you know if you are getting enough fluid? Your urine should be a pale yellow or almost colorless. Get rest. Taking a steamy shower or using a humidifier may help nasal congestion and ease sore throat pain. You can place a towel over your head and breathe in the steam from  hot water coming from a faucet. Using a saline nasal spray works much the same way. Cough drops, hard candies and sore throat lozenges may ease your cough. Avoid close contacts especially the very young and the elderly Cover your mouth if you cough or sneeze Always remember to wash your hands.   GET HELP RIGHT AWAY IF: You develop worsening fever. If your symptoms do not improve within 10 days You develop yellow or green discharge from your nose over 3 days. You have coughing fits You develop a severe head ache or visual changes. You develop shortness of breath, difficulty breathing or start having chest pain Your symptoms persist after you have completed your treatment plan  MAKE SURE YOU  Understand these instructions. Will watch your condition. Will get help right away if you are not doing well or get worse.        If you have been instructed to have an in-person evaluation today at a local Urgent Care facility, please use the link below. It will take you to a list of all of our available [facility], including address, phone number and hours of operation. Please do not delay care.  [facility]  If you or a family member do not have a primary care provider, use the link below  to schedule a visit and establish care. When you choose a [facility] primary care physician or advanced practice provider, you gain a long-term partner in health. Find a Primary Care Provider  Learn more about [facility] in-office and virtual care options: [facility]

## 2021-11-02 NOTE — Progress Notes (Signed)
Larry [name] [address] [name] Phone: [phone] Subjective:   [name], am serving as a scribe for Dr. Hulan Saas. This visit occurred during the SARS-CoV-2 public health emergency.  Safety protocols were in place, including screening questions prior to the visit, additional usage of staff PPE, and extensive cleaning of exam room while observing appropriate contact time as indicated for disinfecting solutions.   I'm seeing this patient by the request  of:  Rice, Wayland Denis, MD  CC: Low back pain follow-up  [id number]  Larry [name] is a 60 y.o. male coming in with complaint of back and neck pain. OMT on 07/29/2021. Patient states LBP. URI wants advice. Cough is persistant.  Medications patient has been prescribed: None         Reviewed prior external information including notes and imaging from previsou exam, outside providers and external EMR if available.   As well as notes that were available from care everywhere and other healthcare systems.  Past medical history, social, surgical and family history all reviewed in electronic medical record.  No pertanent information unless stated regarding to the chief complaint.   Past Medical History:  Diagnosis Date   High cholesterol    Hyperlipidemia    Hypertension     No Known Allergies   Review of Systems:  No headache, visual changes, nausea, vomiting, diarrhea, constipation, dizziness, abdominal pain, skin rash, fevers, chills, night sweats, weight loss, swollen lymph nodes, body aches, joint swelling, chest pain, shortness of breath, mood changes. POSITIVE muscle aches  Objective  Blood pressure 132/82, pulse 82, height 6\' 1"  (1.854 m), weight 179 lb (81.2 kg), SpO2 98 %.   General: No apparent distress alert and oriented x3 mood and affect normal, dressed appropriately.  HEENT: Pupils equal, extraocular movements intact  Respiratory: Patient's speak in  full sentences and does not appear short of breath  Cardiovascular: No lower extremity edema, non tender, no erythema  Low back exam does have some loss of lordosis.  Tightness in the thoracolumbar and lumbosacral areas.  Osteopathic findings  C2 flexed rotated and side bent right C6 flexed rotated and side bent left T6 extended rotated and side bent left L2 flexed rotated and side bent right Sacrum right on right     Assessment and Plan:  Right lumbar radiculitis Chronic problem with worsening tightness, due to coughing discussed with patient to try to be more active.  At this moment has had significant amount of coughing with no significant improvement.  Patient still has fatigue noted as well.  Denies any shortness of breath or chest pain.  Patient knows if worsening symptoms to seek medical attention.  Follow-up again 6 to 8 weeks  Acute upper respiratory infection Over 4 weeks still coughing. No wheezing. sent in augmentin  RTC 6 weeks or seek medical attention if worsening    Nonallopathic problems  Decision today to treat with OMT was based on Physical Exam  After verbal consent patient was treated with HVLA, ME, FPR techniques in cervical,thoracic, lumbar, and sacral  areas  Patient tolerated the procedure well with improvement in symptoms  Patient given exercises, stretches and lifestyle modifications  See medications in patient instructions if given  Patient will follow up in 4-8 weeks      The above documentation has been reviewed and is accurate and complete Lyndal Pulley, DO        Note: This dictation was prepared with Dragon dictation  along with smaller phrase technology. Any transcriptional errors that result from this process are unintentional.

## 2021-11-03 ENCOUNTER — Other Ambulatory Visit (HOSPITAL_COMMUNITY): Payer: Self-pay

## 2021-11-03 ENCOUNTER — Other Ambulatory Visit: Payer: Self-pay

## 2021-11-03 ENCOUNTER — Ambulatory Visit: Payer: 59 | Admitting: Family Medicine

## 2021-11-03 ENCOUNTER — Encounter: Payer: Self-pay | Admitting: Family Medicine

## 2021-11-03 VITALS — BP 132/82 | HR 82 | Ht 73.0 in | Wt 179.0 lb

## 2021-11-03 DIAGNOSIS — M9904 Segmental and somatic dysfunction of sacral region: Secondary | ICD-10-CM

## 2021-11-03 DIAGNOSIS — M5416 Radiculopathy, lumbar region: Secondary | ICD-10-CM | POA: Diagnosis not present

## 2021-11-03 DIAGNOSIS — M9901 Segmental and somatic dysfunction of cervical region: Secondary | ICD-10-CM | POA: Diagnosis not present

## 2021-11-03 DIAGNOSIS — M9903 Segmental and somatic dysfunction of lumbar region: Secondary | ICD-10-CM

## 2021-11-03 DIAGNOSIS — J069 Acute upper respiratory infection, unspecified: Secondary | ICD-10-CM | POA: Diagnosis not present

## 2021-11-03 DIAGNOSIS — M9902 Segmental and somatic dysfunction of thoracic region: Secondary | ICD-10-CM | POA: Diagnosis not present

## 2021-11-03 MED ORDER — AMOXICILLIN-POT CLAVULANATE 875-125 MG PO TABS
1.0000 | ORAL_TABLET | Freq: Two times a day (BID) | ORAL | 0 refills | Status: AC
  Filled 2021-11-03: qty 20, 10d supply, fill #0

## 2021-11-03 MED ORDER — PREDNISONE 20 MG PO TABS
20.0000 mg | ORAL_TABLET | Freq: Every day | ORAL | 0 refills | Status: DC
  Filled 2021-11-03: qty 5, 5d supply, fill #0

## 2021-11-03 NOTE — Assessment & Plan Note (Signed)
Over 4 weeks still coughing. No wheezing. sent in augmentin  RTC 6 weeks or seek medical attention if worsening

## 2021-11-03 NOTE — Assessment & Plan Note (Signed)
Chronic problem with worsening tightness, due to coughing discussed with patient to try to be more active.  At this moment has had significant amount of coughing with no significant improvement.  Patient still has fatigue noted as well.  Denies any shortness of breath or chest pain.  Patient knows if worsening symptoms to seek medical attention.  Follow-up again 6 to 8 weeks

## 2021-11-03 NOTE — Patient Instructions (Signed)
Good to see you! Augmentin 2x/day for 10 days Prednisone 20mg  daily 5 days Consider Zyrtec 10mg  at night until cough goes away See you again in 6 weeks

## 2021-11-11 ENCOUNTER — Other Ambulatory Visit (HOSPITAL_COMMUNITY): Payer: Self-pay

## 2021-11-16 ENCOUNTER — Other Ambulatory Visit (HOSPITAL_COMMUNITY): Payer: Self-pay

## 2021-11-16 DIAGNOSIS — I2510 Atherosclerotic heart disease of native coronary artery without angina pectoris: Secondary | ICD-10-CM | POA: Diagnosis not present

## 2021-11-16 DIAGNOSIS — E785 Hyperlipidemia, unspecified: Secondary | ICD-10-CM | POA: Diagnosis not present

## 2021-11-16 DIAGNOSIS — I10 Essential (primary) hypertension: Secondary | ICD-10-CM | POA: Diagnosis not present

## 2021-11-16 MED ORDER — LISINOPRIL 20 MG PO TABS
20.0000 mg | ORAL_TABLET | Freq: Every day | ORAL | 3 refills | Status: DC
  Filled 2021-11-16: qty 90, 90d supply, fill #0
  Filled 2022-02-19: qty 90, 90d supply, fill #1

## 2021-11-16 MED ORDER — SIMVASTATIN 40 MG PO TABS
ORAL_TABLET | ORAL | 3 refills | Status: AC
  Filled 2021-11-16: qty 90, 90d supply, fill #0

## 2021-11-27 ENCOUNTER — Encounter: Payer: Self-pay | Admitting: Family Medicine

## 2021-11-29 ENCOUNTER — Other Ambulatory Visit (HOSPITAL_COMMUNITY): Payer: Self-pay

## 2021-11-29 MED ORDER — CARESTART COVID-19 HOME TEST VI KIT
PACK | 0 refills | Status: DC
  Filled 2021-11-29: qty 4, 4d supply, fill #0

## 2021-11-29 MED ORDER — HYDROCHLOROTHIAZIDE 12.5 MG PO CAPS
ORAL_CAPSULE | ORAL | 3 refills | Status: DC
  Filled 2021-11-29: qty 90, 90d supply, fill #0
  Filled 2022-02-28: qty 90, 90d supply, fill #1
  Filled 2022-06-07: qty 90, 90d supply, fill #2
  Filled 2022-07-27 – 2022-09-08 (×2): qty 90, 90d supply, fill #3

## 2021-12-14 ENCOUNTER — Other Ambulatory Visit (HOSPITAL_COMMUNITY): Payer: Self-pay

## 2021-12-14 MED ORDER — PEG 3350-KCL-NA BICARB-NACL 420 G PO SOLR
ORAL | 0 refills | Status: AC
  Filled 2021-12-14: qty 4000, 1d supply, fill #0

## 2021-12-14 NOTE — Progress Notes (Signed)
°  [place] Phone: [phone] Subjective:   Larry Larry [name], am serving as a scribe for Dr. Hulan [name]. This visit occurred during the SARS-CoV-2 public health emergency.  Safety protocols were in place, including screening questions prior to the visit, additional usage of staff PPE, and extensive cleaning of exam room while observing appropriate contact time as indicated for disinfecting solutions.  I'm seeing this patient by the request  of:  [name], Larry Denis, MD  CC: Acute worsening of low back pain  [id number]  Larry Larry [name] is a 61 y.o. male coming in with complaint of back and neck pain. OMT 11/03/2021. Patient states that he woke up today took shower and then developed back spasm. Middle of lower back.  Patient denies any radiation down the legs, denies any numbness or tingling.  Denies any dysuria.  Patient likely did not have significant amount of things scheduled  Medications patient has been prescribed: Prednisone  Taking:         Reviewed prior external information including notes and imaging from previsou exam, outside providers and external EMR if available.   As well as notes that were available from care everywhere and other healthcare systems.  Past medical history, social, surgical and family history all reviewed in electronic medical record.  Larry [name] pertanent information unless stated regarding to the chief complaint.   Past Medical History:  Diagnosis Date   High cholesterol    Hyperlipidemia    Hypertension     Larry Larry [name]   Review of Systems:  Larry [name] headache, visual changes, nausea, vomiting, diarrhea, constipation, dizziness, abdominal pain, skin rash, fevers, chills, night sweats, weight loss, swollen lymph nodes, body aches, joint swelling, chest pain, shortness of breath, mood changes. POSITIVE muscle aches  Objective  Blood pressure 106/72, pulse (!) 103, height 6'  1" (1.854 m), weight 177 lb (80.3 kg), SpO2 98 %.   General: Larry [name] apparent distress alert and oriented x3 mood and affect normal, dressed appropriately.  HEENT: Pupils equal, extraocular movements intact  Respiratory: Patient's speak in full sentences and does not appear short of breath  Cardiovascular: Larry [name] lower extremity edema, non tender, Larry [name] erythema  Low back exam has significant loss of lordosis.  Some tenderness to palpation mostly across the paraspinal musculature right greater than left.  Patient does have pain in all the postural muscles.  Patient has limited range of motion secondary to voluntary guarding.  Negative straight leg test.       Assessment and Plan:  Right lumbar radiculitis Patient has significant tightness on the right side.  Likely not having any radicular symptoms.  We will have patient take a shot of Toradol and Depo-Medrol with mostly avoiding oral secondary to patient having a colonoscopy.  Discussed icing regimen and home exercises.  Follow-up again in 2 weeks        The above documentation has been reviewed and is accurate and complete Larry Pulley, DO        Note: This dictation was prepared with Dragon dictation along with smaller phrase technology. Any transcriptional errors that result from this process are unintentional.

## 2021-12-15 ENCOUNTER — Ambulatory Visit: Payer: 59 | Admitting: Family Medicine

## 2021-12-15 ENCOUNTER — Other Ambulatory Visit: Payer: Self-pay

## 2021-12-15 ENCOUNTER — Other Ambulatory Visit (HOSPITAL_COMMUNITY): Payer: Self-pay

## 2021-12-15 DIAGNOSIS — M5416 Radiculopathy, lumbar region: Secondary | ICD-10-CM

## 2021-12-15 MED ORDER — KETOROLAC TROMETHAMINE 60 MG/2ML IM SOLN
60.0000 mg | Freq: Once | INTRAMUSCULAR | Status: AC
  Administered 2021-12-15: 60 mg via INTRAMUSCULAR

## 2021-12-15 MED ORDER — METHYLPREDNISOLONE ACETATE 80 MG/ML IJ SUSP
80.0000 mg | Freq: Once | INTRAMUSCULAR | Status: AC
  Administered 2021-12-15: 80 mg via INTRAMUSCULAR

## 2021-12-15 MED ORDER — TIZANIDINE HCL 4 MG PO TABS
4.0000 mg | ORAL_TABLET | Freq: Two times a day (BID) | ORAL | 0 refills | Status: DC
  Filled 2021-12-15: qty 30, 15d supply, fill #0

## 2021-12-15 NOTE — Patient Instructions (Signed)
Zanaflex 2x a day  Injections in backside See me in 2 weeks

## 2021-12-15 NOTE — Assessment & Plan Note (Signed)
Patient has significant tightness on the right side.  Likely not having any radicular symptoms.  We will have patient take a shot of Toradol and Depo-Medrol with mostly avoiding oral secondary to patient having a colonoscopy.  Discussed icing regimen and home exercises.  Follow-up again in 2 weeks

## 2021-12-16 DIAGNOSIS — K635 Polyp of colon: Secondary | ICD-10-CM | POA: Diagnosis not present

## 2021-12-16 DIAGNOSIS — K648 Other hemorrhoids: Secondary | ICD-10-CM | POA: Diagnosis not present

## 2021-12-16 DIAGNOSIS — K5730 Diverticulosis of large intestine without perforation or abscess without bleeding: Secondary | ICD-10-CM | POA: Diagnosis not present

## 2021-12-16 DIAGNOSIS — Z1211 Encounter for screening for malignant neoplasm of colon: Secondary | ICD-10-CM | POA: Diagnosis not present

## 2021-12-19 ENCOUNTER — Encounter: Payer: Self-pay | Admitting: Family Medicine

## 2021-12-27 NOTE — Progress Notes (Signed)
[place] [address] [place] Phone: [phone] Subjective:   [name], am serving as a scribe for Dr. Hulan Saas. This visit occurred during the SARS-CoV-2 public health emergency.  Safety protocols were in place, including screening questions prior to the visit, additional usage of staff PPE, and extensive cleaning of exam room while observing appropriate contact time as indicated for disinfecting solutions.   I'm seeing this patient by the request  of:  Bobbye Charleston, MD  CC: Low back pain follow-up  [id number]  12/15/2021 Patient has significant tightness on the right side.  Likely not having any radicular symptoms.  We will have patient take a shot of Toradol and Depo-Medrol with mostly avoiding oral secondary to patient having a colonoscopy.  Discussed icing regimen and home exercises.  Follow-up again in 2 weeks  Update 12/28/2021 Larry [name] is a 61 y.o. male coming in with complaint of LBP. Patient states here for manipulation.  Patient was seen previously and was having more of an exacerbation.  Given Toradol and Depo-Medrol.  Patient states significant improvement after that and was 100% better.  Now noticing some mild tightness but nothing severe.     Past Medical History:  Diagnosis Date   High cholesterol    Hyperlipidemia    Hypertension    Past Surgical History:  Procedure Laterality Date   APPENDECTOMY     Social History   Socioeconomic History   Marital status: Married    Spouse name: Not on file   Number of children: Not on file   Years of education: Not on file   Highest education level: Not on file  Occupational History   Not on file  Tobacco Use   Smoking status: Never   Smokeless tobacco: Never  Substance and Sexual Activity   Alcohol use: Yes    Comment: 2 q wk, socially   Drug use: No   Sexual activity: Not on file  Other Topics Concern   Not on file  Social History  Narrative   Not on file   Social Determinants of Health   Financial Resource Strain: Not on file  Food Insecurity: Not on file  Transportation Needs: Not on file  Physical Activity: Not on file  Stress: Not on file  Social Connections: Not on file   No Known Allergies Family History  Problem Relation Age of Onset   Hyperlipidemia Mother    Hyperlipidemia Father     Current Outpatient Medications (Endocrine & Metabolic):    predniSONE (DELTASONE) 20 MG tablet, Take 1 tablet (20 mg total) by mouth daily with breakfast.  Current Outpatient Medications (Cardiovascular):    candesartan-hydrochlorothiazide (ATACAND HCT) 32-12.5 MG tablet, Take 1 tablet by mouth daily.   hydrochlorothiazide (MICROZIDE) 12.5 MG capsule, TAKE 1 CAPSULE (12.5 MG TOTAL) BY MOUTH DAILY FOR BLOOD PRESSURE   lisinopril (ZESTRIL) 10 MG tablet, TAKE 1 TABLET BY MOUTH DAILY FOR BLOOD PRESSURE   lisinopril (ZESTRIL) 20 MG tablet, TAKE 1 TABLET BY MOUTH DAILY FOR BLOOD PRESSURE   simvastatin (ZOCOR) 20 MG tablet, TAKE 1 TABLET BY MOUTH NIGHTLY   simvastatin (ZOCOR) 20 MG tablet, Take 1 tablet (20 mg total) by mouth nightly.   simvastatin (ZOCOR) 40 MG tablet, Take 1 tablet (40 mg total) by mouth daily for cholesterol  Current Outpatient Medications (Respiratory):    benzonatate (TESSALON) 100 MG capsule, Take 1 capsule (100 mg total) by mouth 3 (three) times daily as needed for  cough.   fluticasone (FLONASE) 50 MCG/ACT nasal spray, Use 2 sprays into both nostrils daily.    Current Outpatient Medications (Other):    ALPRAZolam (XANAX) 0.5 MG tablet, Take 1 tablet by mouth every 6 to 8 hours if needed for anxiety   COVID-19 At Home Antigen Test (CARESTART COVID-19 HOME TEST) KIT, Use as directed within package instructions   polyethylene glycol-electrolytes (NULYTELY) 420 g solution, Use as directed   tiZANidine (ZANAFLEX) 4 MG tablet, Take 1 tablet (4 mg total) by mouth in the morning and at bedtime.   Review  of Systems:  No headache, visual changes, nausea, vomiting, diarrhea, constipation, dizziness, abdominal pain, skin rash, fevers, chills, night sweats, weight loss, swollen lymph nodes, body aches, joint swelling, chest pain, shortness of breath, mood changes. POSITIVE muscle aches  Objective  Blood pressure 128/84, pulse 85, height 6' 1"  (1.854 m), weight 177 lb (80.3 kg), SpO2 99 %.   General: No apparent distress alert and oriented x3 mood and affect normal, dressed appropriately.  HEENT: Pupils equal, extraocular movements intact  Respiratory: Patient's speak in full sentences and does not appear short of breath  Cardiovascular: No lower extremity edema, non tender, no erythema  Gait normal with good balance and coordination.  MSK:   Low back exam does have some loss of lordosis.  Some tenderness to palpation in the paraspinal musculature.  Very mild overall though.  Patient does have tightness on the right hip flexor still compared to left.  Tightness with straight leg test but no radicular symptoms.  Osteopathic findings  T4 extended rotated and side bent left L2 flexed rotated and side bent right Sacrum right on right     Impression and Recommendations:     The above documentation has been reviewed and is accurate and complete Lyndal Pulley, DO

## 2021-12-28 ENCOUNTER — Other Ambulatory Visit: Payer: Self-pay

## 2021-12-28 ENCOUNTER — Ambulatory Visit (INDEPENDENT_AMBULATORY_CARE_PROVIDER_SITE_OTHER): Payer: 59 | Admitting: Family Medicine

## 2021-12-28 VITALS — BP 128/84 | HR 85 | Ht 73.0 in | Wt 177.0 lb

## 2021-12-28 DIAGNOSIS — M9903 Segmental and somatic dysfunction of lumbar region: Secondary | ICD-10-CM | POA: Diagnosis not present

## 2021-12-28 DIAGNOSIS — M5416 Radiculopathy, lumbar region: Secondary | ICD-10-CM

## 2021-12-28 DIAGNOSIS — M999 Biomechanical lesion, unspecified: Secondary | ICD-10-CM | POA: Diagnosis not present

## 2021-12-28 DIAGNOSIS — M9904 Segmental and somatic dysfunction of sacral region: Secondary | ICD-10-CM

## 2021-12-28 DIAGNOSIS — M9902 Segmental and somatic dysfunction of thoracic region: Secondary | ICD-10-CM | POA: Diagnosis not present

## 2021-12-28 NOTE — Assessment & Plan Note (Signed)
Much more stable than previous exam.  Discussed icing regimen and home exercises.  Given more of a hip flexor stretch that I think will be more beneficial.  Patient will follow up with me again in 6 to 8 weeks

## 2021-12-28 NOTE — Assessment & Plan Note (Signed)

## 2021-12-28 NOTE — Patient Instructions (Signed)
Glad you are better See me in 6-8 weeks

## 2022-01-10 ENCOUNTER — Other Ambulatory Visit: Payer: Self-pay

## 2022-01-17 DIAGNOSIS — E785 Hyperlipidemia, unspecified: Secondary | ICD-10-CM | POA: Diagnosis not present

## 2022-01-17 DIAGNOSIS — I10 Essential (primary) hypertension: Secondary | ICD-10-CM | POA: Diagnosis not present

## 2022-01-24 ENCOUNTER — Other Ambulatory Visit (HOSPITAL_BASED_OUTPATIENT_CLINIC_OR_DEPARTMENT_OTHER): Payer: Self-pay

## 2022-01-24 ENCOUNTER — Ambulatory Visit: Payer: 59 | Attending: Internal Medicine

## 2022-01-24 DIAGNOSIS — Z23 Encounter for immunization: Secondary | ICD-10-CM

## 2022-01-24 MED ORDER — PFIZER COVID-19 VAC BIVALENT 30 MCG/0.3ML IM SUSP
INTRAMUSCULAR | 0 refills | Status: AC
  Filled 2022-01-24: qty 0.3, 1d supply, fill #0

## 2022-01-24 NOTE — Progress Notes (Signed)
° °  Covid-[facility] Vaccination Clinic  Name:  Larry [name]    MRN: [id number] DOB: 1961-06-11  01/24/2022  [name] was observed post Covid-19 immunization for 15 minutes without incident. He was provided with Vaccine Information Sheet and instruction to access the V-Safe system.   [name] was instructed to call 911 with any severe reactions post vaccine: Difficulty breathing  Swelling of face and throat  A fast heartbeat  A bad rash all over body  Dizziness and weakness   Immunizations Administered     Name Date Dose VIS Date Route   Pfizer Covid-19 Vaccine Bivalent Booster 01/24/2022  1:24 PM 0.3 mL 07/27/2021 Intramuscular   Manufacturer: [place]   Lot: [id number]   [place]: [phone]

## 2022-02-03 NOTE — Progress Notes (Unsigned)
°  [place] Phone: [phone] Subjective:    I'm seeing this patient by the request  of:  Rice, Wayland Denis, MD  CC:   [id number]  TYRE BEAVER is a 61 y.o. male coming in with complaint of back and neck pain. OMT 12/28/2021. Patient states   Medications patient has been prescribed: Zanaflex  Taking:         Reviewed prior external information including notes and imaging from previsou exam, outside providers and external EMR if available.   As well as notes that were available from care everywhere and other healthcare systems.  Past medical history, social, surgical and family history all reviewed in electronic medical record.  No pertanent information unless stated regarding to the chief complaint.   Past Medical History:  Diagnosis Date   High cholesterol    Hyperlipidemia    Hypertension     No Known Allergies   Review of Systems:  No headache, visual changes, nausea, vomiting, diarrhea, constipation, dizziness, abdominal pain, skin rash, fevers, chills, night sweats, weight loss, swollen lymph nodes, body aches, joint swelling, chest pain, shortness of breath, mood changes. POSITIVE muscle aches  Objective  There were no vitals taken for this visit.   General: No apparent distress alert and oriented x3 mood and affect normal, dressed appropriately.  HEENT: Pupils equal, extraocular movements intact  Respiratory: Patient's speak in full sentences and does not appear short of breath  Cardiovascular: No lower extremity edema, non tender, no erythema  Neuro: Cranial nerves II through XII are intact, neurovascularly intact in all extremities with 2+ DTRs and 2+ pulses.  Gait normal with good balance and coordination.  MSK:  Non tender with full range of motion and good stability and symmetric strength and tone of shoulders, elbows, wrist, hip, knee and ankles bilaterally.  Back - Normal  skin, Spine with normal alignment and no deformity.  No tenderness to vertebral process palpation.  Paraspinous muscles are not tender and without spasm.   Range of motion is full at neck and lumbar sacral regions  Osteopathic findings  C2 flexed rotated and side bent right C6 flexed rotated and side bent left T3 extended rotated and side bent right inhaled rib T9 extended rotated and side bent left L2 flexed rotated and side bent right Sacrum right on right       Assessment and Plan:    Nonallopathic problems  Decision today to treat with OMT was based on Physical Exam  After verbal consent patient was treated with HVLA, ME, FPR techniques in cervical, rib, thoracic, lumbar, and sacral  areas  Patient tolerated the procedure well with improvement in symptoms  Patient given exercises, stretches and lifestyle modifications  See medications in patient instructions if given  Patient will follow up in 4-8 weeks      The above documentation has been reviewed and is accurate and complete Jacqualin Combes       Note: This dictation was prepared with Dragon dictation along with smaller phrase technology. Any transcriptional errors that result from this process are unintentional.

## 2022-02-06 ENCOUNTER — Ambulatory Visit: Payer: 59 | Admitting: Dermatology

## 2022-02-06 ENCOUNTER — Encounter: Payer: Self-pay | Admitting: Dermatology

## 2022-02-06 ENCOUNTER — Other Ambulatory Visit: Payer: Self-pay

## 2022-02-06 DIAGNOSIS — L089 Local infection of the skin and subcutaneous tissue, unspecified: Secondary | ICD-10-CM | POA: Diagnosis not present

## 2022-02-06 NOTE — Progress Notes (Signed)
? ?  Follow-Up Visit ?  ?Subjective  ?Larry [name] is a 61 y.o. male who presents for the following: Cyst (Cyst on the L jawline x 1 mo, has been the current size since 3/9. Painful, oozing since last night. Pt has not taken anything or treated it at home). ? ?Draining nodule left pectoral line. ?Location:  ?Duration:  ?Quality:  ?Associated Signs/Symptoms: ?Modifying Factors:  ?Severity:  ?Timing: ?Context:  ? ?Objective  ?Well appearing patient in no apparent distress; mood and affect are within normal limits. ?Left Anterior Mandible ?Acutely inflamed, fluctuant 3 cm nodule left posterior mandible.  I explained the difference between true abscess (primary infection) and an inflamed ("infected"') cyst and I strongly favor the latter of these.  This clearly needs to be incised and drained and likely cultured.  We discussed an oral antibiotic but I generally find with adequate drainage this is necessary and he was agreeable. ? ? ? ?A focused examination was performed including head and neck.. Relevant physical exam findings are noted in the Assessment and Plan. ? ? ?Assessment & Plan  ? ? ?Local infection of skin and subcutaneous tissue ?Left Anterior Mandible ? ?#11  Scalpel blade incision without anesthesia was well tolerated.  Roughly 2 cc of pus (culture taken) along with some cyst wall was removed.  He will use clean hot washcloth 10-minute compress followed by gently milking the area twice daily for at least the next 2 to 4 days or until there is no more drainage.  I have asked Larry [name] to contact me in 3 days with a status update and possible culture report. ? ?Incision and Drainage - Left Anterior Mandible ?Location: L Anterior Mandilble ? ?Informed Consent: Discussed risks (permanent scarring, light or dark discoloration, infection, pain, bleeding, bruising, redness, damage to adjacent structures, and recurrence of the lesion) and benefits of the procedure, as well as the alternatives.  Informed  consent was obtained. ? ?Preparation: The area was prepped with Hibiclens ? ?Procedure Details: An incision was made overlying the lesion. The lesion drained pus and keratinous debris, roughly 2 cc.    ?Sterile Vaseline and a sterile pressure dressing were applied. The patient tolerated procedure well. ? ?Total number of lesions drained: 1 ? ?Plan: The patient was instructed on post-op care. Recommend OTC analgesia as needed for pain. ? ? ?Anaerobic and Aerobic Culture - Left Anterior Mandible ? ? ? ? ? ?I, Lavonna Monarch, MD, have reviewed all documentation for this visit.  The documentation on 02/06/22 for the exam, diagnosis, procedures, and orders are all accurate and complete. ?

## 2022-02-06 NOTE — Patient Instructions (Signed)

## 2022-02-08 ENCOUNTER — Ambulatory Visit: Payer: 59 | Admitting: Family Medicine

## 2022-02-08 ENCOUNTER — Other Ambulatory Visit: Payer: Self-pay

## 2022-02-08 VITALS — BP 106/68 | HR 84 | Ht 73.0 in | Wt 178.0 lb

## 2022-02-08 DIAGNOSIS — M9904 Segmental and somatic dysfunction of sacral region: Secondary | ICD-10-CM | POA: Diagnosis not present

## 2022-02-08 DIAGNOSIS — M9908 Segmental and somatic dysfunction of rib cage: Secondary | ICD-10-CM | POA: Diagnosis not present

## 2022-02-08 DIAGNOSIS — M5416 Radiculopathy, lumbar region: Secondary | ICD-10-CM | POA: Diagnosis not present

## 2022-02-08 DIAGNOSIS — M9901 Segmental and somatic dysfunction of cervical region: Secondary | ICD-10-CM | POA: Diagnosis not present

## 2022-02-08 DIAGNOSIS — M9903 Segmental and somatic dysfunction of lumbar region: Secondary | ICD-10-CM

## 2022-02-08 DIAGNOSIS — M9902 Segmental and somatic dysfunction of thoracic region: Secondary | ICD-10-CM

## 2022-02-08 NOTE — Patient Instructions (Signed)
Great to see you ?Tighter than expected ?See you in 6 weeks ?

## 2022-02-08 NOTE — Assessment & Plan Note (Signed)
Patient had some mild increase in tightness of the musculature but still no significant radicular symptoms.  Patient does have some potential increase in stress recently we discussed with patient icing regimen and home exercises.  Discussed posture and ergonomics.  Patient is going to continue to monitor this and stay active.  Follow-up with me again in 6 to 8 weeks. ?

## 2022-02-09 ENCOUNTER — Telehealth (INDEPENDENT_AMBULATORY_CARE_PROVIDER_SITE_OTHER): Payer: 59 | Admitting: Dermatology

## 2022-02-09 NOTE — Telephone Encounter (Signed)
I called patient to let him know that his preliminary culture showed no bacterial growth.  He reported that there is much improvement in the facial lesion.  He has my cell phone if he needs to contact me. ?

## 2022-02-10 ENCOUNTER — Encounter: Payer: Self-pay | Admitting: Family Medicine

## 2022-02-10 LAB — ANAEROBIC AND AEROBIC CULTURE
AER RESULT:: NO GROWTH
GRAM STAIN:: NONE SEEN
MICRO NUMBER:: 13122164
MICRO NUMBER:: 13122165
SPECIMEN QUALITY:: ADEQUATE
SPECIMEN QUALITY:: ADEQUATE

## 2022-02-13 ENCOUNTER — Other Ambulatory Visit (HOSPITAL_COMMUNITY): Payer: Self-pay

## 2022-02-13 MED ORDER — HYDROXYZINE HCL 10 MG PO TABS
10.0000 mg | ORAL_TABLET | Freq: Three times a day (TID) | ORAL | 1 refills | Status: AC | PRN
  Filled 2022-02-13: qty 90, 30d supply, fill #0

## 2022-02-19 ENCOUNTER — Other Ambulatory Visit (HOSPITAL_COMMUNITY): Payer: Self-pay

## 2022-02-20 ENCOUNTER — Other Ambulatory Visit (HOSPITAL_COMMUNITY): Payer: Self-pay

## 2022-03-01 ENCOUNTER — Other Ambulatory Visit (HOSPITAL_COMMUNITY): Payer: Self-pay

## 2022-03-22 NOTE — Progress Notes (Signed)
?  Charlann Boxer D.O. ?[facility] ?[place] ?Phone: [phone] ?Subjective:   ?I, Jacqualin Combes, am serving as a scribe for Dr. Hulan Saas. ? ?This visit occurred during the SARS-CoV-2 public health emergency.  Safety protocols were in place, including screening questions prior to the visit, additional usage of staff PPE, and extensive cleaning of exam room while observing appropriate contact time as indicated for disinfecting solutions.  ?I'm seeing this patient by the request  of:  Bobbye Charleston, MD ? ?CC: Back and neck pain ? ?[id number]  ?HJALMER IOVINO is a 61 y.o. male coming in with complaint of back and neck pain. OMT 02/08/2022. Patient states that he has been doing well since last visit.  Patient has had some traveling but nothing significant at this moment. ? ?Medications patient has been prescribed: Hydroxizine ? ?Taking: ? ? ?  ? ? ? ? ?Reviewed prior external information including notes and imaging from previsou exam, outside providers and external EMR if available.  ? ?As well as notes that were available from care everywhere and other healthcare systems. ? ?Past medical history, social, surgical and family history all reviewed in electronic medical record.  No pertanent information unless stated regarding to the chief complaint.  ? ?Past Medical History:  ?Diagnosis Date  ? High cholesterol   ? Hyperlipidemia   ? Hypertension   ?  ?No Known Allergies ? ? ?Review of Systems: ? No headache, visual changes, nausea, vomiting, diarrhea, constipation, dizziness, abdominal pain, skin rash, fevers, chills, night sweats, weight loss, swollen lymph nodes, body aches, joint swelling, chest pain, shortness of breath, mood changes. POSITIVE muscle aches ? ?Objective  ?Blood pressure 110/70, pulse 87, height '6\' 1"'$  (1.854 m), weight 177 lb (80.3 kg), SpO2 99 %. ?  ?General: No apparent distress alert and oriented x3 mood and affect normal, dressed appropriately.   ?HEENT: Pupils equal, extraocular movements intact  ?Respiratory: Patient's speak in full sentences and does not appear short of breath  ?Cardiovascular: No lower extremity edema, non tender, no erythema  ?Low back exam does have some tightness noted.  Tenderness to palpation noted. ? ?Osteopathic findings ? ?C2 flexed rotated and side bent right ?C7 flexed rotated and side bent left ?T9 extended rotated and side bent left ?L2 flexed rotated and side bent right ?Sacrum right on right ? ? ? ? ?  ?Assessment and Plan: ? ?Right lumbar radiculitis ?Patient has been doing remarkably well at this time.  Discussed icing regimen and home exercises, which activities to do and which ones to avoid, increase activity slowly otherwise.  Follow-up with me again in 6 to 8 weeks.  ? ?Nonallopathic problems ? ?Decision today to treat with OMT was based on Physical Exam ? ?After verbal consent patient was treated with HVLA, ME, FPR techniques in cervical, \, thoracic, lumbar, and sacral  areas ? ?Patient tolerated the procedure well with improvement in symptoms ? ?Patient given exercises, stretches and lifestyle modifications ? ?See medications in patient instructions if given ? ?Patient will follow up in 4-8 weeks ? ?  ? ? ?The above documentation has been reviewed and is accurate and complete Lyndal Pulley, DO  ? ? ?  ? ? Note: This dictation was prepared with Dragon dictation along with smaller phrase technology. Any transcriptional errors that result from this process are unintentional.    ?  ?  ? ?

## 2022-03-23 ENCOUNTER — Ambulatory Visit: Payer: 59 | Admitting: Family Medicine

## 2022-03-23 VITALS — BP 110/70 | HR 87 | Ht 73.0 in | Wt 177.0 lb

## 2022-03-23 DIAGNOSIS — M9903 Segmental and somatic dysfunction of lumbar region: Secondary | ICD-10-CM | POA: Diagnosis not present

## 2022-03-23 DIAGNOSIS — M5416 Radiculopathy, lumbar region: Secondary | ICD-10-CM

## 2022-03-23 DIAGNOSIS — M9902 Segmental and somatic dysfunction of thoracic region: Secondary | ICD-10-CM

## 2022-03-23 DIAGNOSIS — M9901 Segmental and somatic dysfunction of cervical region: Secondary | ICD-10-CM

## 2022-03-23 NOTE — Assessment & Plan Note (Signed)
Patient has been doing remarkably well at this time.  Discussed icing regimen and home exercises, which activities to do and which ones to avoid, increase activity slowly otherwise.  Follow-up with me again in 6 to 8 weeks. ?

## 2022-03-23 NOTE — Patient Instructions (Signed)
Good to see you See me again in 7-8 weeks 

## 2022-03-27 ENCOUNTER — Other Ambulatory Visit (HOSPITAL_COMMUNITY): Payer: Self-pay

## 2022-03-27 DIAGNOSIS — E785 Hyperlipidemia, unspecified: Secondary | ICD-10-CM | POA: Diagnosis not present

## 2022-03-27 DIAGNOSIS — I10 Essential (primary) hypertension: Secondary | ICD-10-CM | POA: Diagnosis not present

## 2022-03-27 MED ORDER — ROSUVASTATIN CALCIUM 20 MG PO TABS
ORAL_TABLET | ORAL | 3 refills | Status: AC
  Filled 2022-03-27: qty 90, 90d supply, fill #0
  Filled 2022-06-30: qty 90, 90d supply, fill #1

## 2022-05-03 ENCOUNTER — Other Ambulatory Visit (HOSPITAL_COMMUNITY): Payer: Self-pay

## 2022-05-03 MED ORDER — AMLODIPINE BESYLATE 5 MG PO TABS
ORAL_TABLET | ORAL | 3 refills | Status: AC
  Filled 2022-05-03: qty 90, 90d supply, fill #0

## 2022-05-10 NOTE — Progress Notes (Signed)
  [place] Phone: [phone] Subjective:   [name], am serving as a scribe for Dr. Hulan Saas.  I'm seeing this patient by the request  of:  Rice, Wayland Denis, MD  CC: Back and neck pain follow-up  [id number]  AHAD COLARUSSO is a 61 y.o. male coming in with complaint of back and neck pain. OMT 03/23/2022. Patient states she continues to have some discomfort but nothing severe.  Able to do all daily activities.  Sleeping comfortably.  Will be traveling here coming up soon.  Medications patient has been prescribed: Hydroxizine  Taking:           Past Medical History:  Diagnosis Date   High cholesterol    Hyperlipidemia    Hypertension        Review of Systems:  No headache, visual changes, nausea, vomiting, diarrhea, constipation, dizziness, abdominal pain, skin rash, fevers, chills, night sweats, weight loss, swollen lymph nodes, body aches, joint swelling, chest pain, shortness of breath, mood changes. POSITIVE muscle aches but minimal  Objective  Blood pressure 110/72, pulse 78, height '6\' 1"'$  (1.854 m), weight 177 lb (80.3 kg), SpO2 98 %.   General: No apparent distress alert and oriented x3 mood and affect normal, dressed appropriately.  HEENT: Pupils equal, extraocular movements intact  Respiratory: Patient's speak in full sentences and does not appear short of breath  Cardiovascular: No lower extremity edema, non tender, no erythema  Gait MSK:  Back   Osteopathic findings  C7 flexed rotated and side bent left T9 extended rotated and side bent left L1 flexed rotated and side bent right Sacrum right on right       Assessment and Plan:  Right lumbar radiculitis Stable overall.  Discussed icing regimen and home exercises given.  Nothing that makes me to concern for patient at the moment.  Likely no true radicular symptoms anymore.  Patient has not had to take any  over-the-counter medications.  Responded extremely well though to osteopathic manipulation and follow-up again in 2 months.    Nonallopathic problems  Decision today to treat with OMT was based on Physical Exam  After verbal consent patient was treated with HVLA, ME, FPR techniques in cervical,  thoracic, lumbar, and sacral  areas  Patient tolerated the procedure well with improvement in symptoms  Patient given exercises, stretches and lifestyle modifications  See medications in patient instructions if given  Patient will follow up in 8 weeks      The above documentation has been reviewed and is accurate and complete Lyndal Pulley, DO        Note: This dictation was prepared with Dragon dictation along with smaller phrase technology. Any transcriptional errors that result from this process are unintentional.

## 2022-05-12 ENCOUNTER — Ambulatory Visit: Payer: 59 | Admitting: Family Medicine

## 2022-05-12 VITALS — BP 110/72 | HR 78 | Ht 73.0 in | Wt 177.0 lb

## 2022-05-12 DIAGNOSIS — M9903 Segmental and somatic dysfunction of lumbar region: Secondary | ICD-10-CM

## 2022-05-12 DIAGNOSIS — M9901 Segmental and somatic dysfunction of cervical region: Secondary | ICD-10-CM | POA: Diagnosis not present

## 2022-05-12 DIAGNOSIS — M5416 Radiculopathy, lumbar region: Secondary | ICD-10-CM | POA: Diagnosis not present

## 2022-05-12 DIAGNOSIS — M9902 Segmental and somatic dysfunction of thoracic region: Secondary | ICD-10-CM

## 2022-05-12 NOTE — Patient Instructions (Signed)
Good to see you Happy Father's Day Make sure you do whatever your wife tells you See me in 2-3 months

## 2022-05-12 NOTE — Assessment & Plan Note (Signed)
Stable overall.  Discussed icing regimen and home exercises given.  Nothing that makes me to concern for patient at the moment.  Likely no true radicular symptoms anymore.  Patient has not had to take any over-the-counter medications.  Responded extremely well though to osteopathic manipulation and follow-up again in 2 months.

## 2022-05-31 ENCOUNTER — Other Ambulatory Visit (HOSPITAL_COMMUNITY): Payer: Self-pay

## 2022-05-31 DIAGNOSIS — I10 Essential (primary) hypertension: Secondary | ICD-10-CM | POA: Diagnosis not present

## 2022-05-31 DIAGNOSIS — E785 Hyperlipidemia, unspecified: Secondary | ICD-10-CM | POA: Diagnosis not present

## 2022-05-31 DIAGNOSIS — M25473 Effusion, unspecified ankle: Secondary | ICD-10-CM | POA: Diagnosis not present

## 2022-05-31 MED ORDER — LOSARTAN POTASSIUM 50 MG PO TABS
ORAL_TABLET | ORAL | 3 refills | Status: DC
  Filled 2022-05-31: qty 90, 90d supply, fill #0
  Filled 2022-07-27: qty 90, 90d supply, fill #1
  Filled 2022-08-07: qty 90, 90d supply, fill #0

## 2022-06-01 ENCOUNTER — Other Ambulatory Visit (HOSPITAL_BASED_OUTPATIENT_CLINIC_OR_DEPARTMENT_OTHER): Payer: Self-pay

## 2022-06-01 MED ORDER — ZOSTER VAC RECOMB ADJUVANTED 50 MCG/0.5ML IM SUSR
INTRAMUSCULAR | 1 refills | Status: DC
  Filled 2022-06-01: qty 0.5, 1d supply, fill #0
  Filled 2022-08-21: qty 0.5, 1d supply, fill #1

## 2022-06-08 ENCOUNTER — Other Ambulatory Visit (HOSPITAL_COMMUNITY): Payer: Self-pay

## 2022-06-30 ENCOUNTER — Other Ambulatory Visit (HOSPITAL_COMMUNITY): Payer: Self-pay

## 2022-06-30 DIAGNOSIS — I10 Essential (primary) hypertension: Secondary | ICD-10-CM | POA: Diagnosis not present

## 2022-06-30 DIAGNOSIS — E785 Hyperlipidemia, unspecified: Secondary | ICD-10-CM | POA: Diagnosis not present

## 2022-07-17 DIAGNOSIS — R609 Edema, unspecified: Secondary | ICD-10-CM | POA: Diagnosis not present

## 2022-07-17 DIAGNOSIS — I10 Essential (primary) hypertension: Secondary | ICD-10-CM | POA: Diagnosis not present

## 2022-07-17 DIAGNOSIS — I2510 Atherosclerotic heart disease of native coronary artery without angina pectoris: Secondary | ICD-10-CM | POA: Diagnosis not present

## 2022-07-17 DIAGNOSIS — E785 Hyperlipidemia, unspecified: Secondary | ICD-10-CM | POA: Diagnosis not present

## 2022-07-18 NOTE — Progress Notes (Unsigned)
  Larry [name] [address] [place] Phone: [phone] Subjective:   [name], am serving as a scribe for Dr. Hulan Saas.  I'm seeing this patient by the request  of:  Rice, Wayland Denis, MD  CC: Back and neck pain follow-up  [id number]  KLAY SOBOTKA is a 61 y.o. male coming in with complaint of back and neck pain. OMT 05/12/2022.  Patient states doing well since last visit. No new issues.  Mild increase in tightness around the neck he states.  Has been doing more weightlifting recently.  Medications patient has been prescribed: None  Taking:        Past Medical History:  Diagnosis Date   High cholesterol    Hyperlipidemia    Hypertension     No Known Allergies    Objective  Blood pressure 118/76, pulse 77, height '6\' 1"'$  (1.854 m), weight 187 lb (84.8 kg), SpO2 98 %.   General: No apparent distress alert and oriented x3 mood and affect normal, dressed appropriately.  HEENT: Pupils equal, extraocular movements intact  Respiratory: Patient's speak in full sentences and does not appear short of breath  Cardiovascular: No lower extremity edema, non tender, no erythema  Gait normal  MSK:  Back does have some loss of lordosis.  Some tenderness to palpation in the paraspinal musculature otherwise. Patient does have more tightness of the trapezius bilaterally but full range of motion of the neck.  Negative Spurling's.   Osteopathic findings  C3 flexed rotated and side bent right C7 flexed rotated and side bent left T3 extended rotated and side bent right inhaled rib T9 extended rotated and side bent left L2 flexed rotated and side bent right Sacrum right on right     Assessment and Plan:  Right lumbar radiculitis Continue tightness on the right side of the back.  Nothing that is stopping him from activity though.  Patient did have more tightness in the upper back as well but has been doing some more  lifting.  Likely this is contributing to some of it.  We discussed with patient proper lifting mechanics but otherwise can do well with conservative therapy.  Follow-up with me again in 6 to 8 weeks    Nonallopathic problems  Decision today to treat with OMT was based on Physical Exam  After verbal consent patient was treated with HVLA, ME, FPR techniques in cervical, rib, thoracic, lumbar, and sacral  areas  Patient tolerated the procedure well with improvement in symptoms  Patient given exercises, stretches and lifestyle modifications  See medications in patient instructions if given  Patient will follow up in 4-8 weeks    The above documentation has been reviewed and is accurate and complete Lyndal Pulley, DO          Note: This dictation was prepared with Dragon dictation along with smaller phrase technology. Any transcriptional errors that result from this process are unintentional.

## 2022-07-20 ENCOUNTER — Encounter: Payer: Self-pay | Admitting: Family Medicine

## 2022-07-20 ENCOUNTER — Ambulatory Visit: Payer: 59 | Admitting: Family Medicine

## 2022-07-20 VITALS — BP 118/76 | HR 77 | Ht 73.0 in | Wt 187.0 lb

## 2022-07-20 DIAGNOSIS — M9904 Segmental and somatic dysfunction of sacral region: Secondary | ICD-10-CM | POA: Diagnosis not present

## 2022-07-20 DIAGNOSIS — M9903 Segmental and somatic dysfunction of lumbar region: Secondary | ICD-10-CM

## 2022-07-20 DIAGNOSIS — M9901 Segmental and somatic dysfunction of cervical region: Secondary | ICD-10-CM

## 2022-07-20 DIAGNOSIS — M9908 Segmental and somatic dysfunction of rib cage: Secondary | ICD-10-CM

## 2022-07-20 DIAGNOSIS — M9902 Segmental and somatic dysfunction of thoracic region: Secondary | ICD-10-CM | POA: Diagnosis not present

## 2022-07-20 DIAGNOSIS — M5416 Radiculopathy, lumbar region: Secondary | ICD-10-CM

## 2022-07-20 NOTE — Patient Instructions (Signed)
Good to see you! Give your tailor props

## 2022-07-20 NOTE — Assessment & Plan Note (Signed)
Continue tightness on the right side of the back.  Nothing that is stopping him from activity though.  Patient did have more tightness in the upper back as well but has been doing some more lifting.  Likely this is contributing to some of it.  We discussed with patient proper lifting mechanics but otherwise can do well with conservative therapy.  Follow-up with me again in 6 to 8 weeks

## 2022-07-27 ENCOUNTER — Other Ambulatory Visit (HOSPITAL_COMMUNITY): Payer: Self-pay

## 2022-07-29 ENCOUNTER — Other Ambulatory Visit (HOSPITAL_COMMUNITY): Payer: Self-pay

## 2022-08-03 DIAGNOSIS — I10 Essential (primary) hypertension: Secondary | ICD-10-CM | POA: Diagnosis not present

## 2022-08-03 DIAGNOSIS — F419 Anxiety disorder, unspecified: Secondary | ICD-10-CM | POA: Diagnosis not present

## 2022-08-03 DIAGNOSIS — Z0000 Encounter for general adult medical examination without abnormal findings: Secondary | ICD-10-CM | POA: Diagnosis not present

## 2022-08-03 DIAGNOSIS — E785 Hyperlipidemia, unspecified: Secondary | ICD-10-CM | POA: Diagnosis not present

## 2022-08-03 DIAGNOSIS — M2560 Stiffness of unspecified joint, not elsewhere classified: Secondary | ICD-10-CM | POA: Diagnosis not present

## 2022-08-07 ENCOUNTER — Other Ambulatory Visit (HOSPITAL_BASED_OUTPATIENT_CLINIC_OR_DEPARTMENT_OTHER): Payer: Self-pay

## 2022-08-08 ENCOUNTER — Other Ambulatory Visit (HOSPITAL_COMMUNITY): Payer: Self-pay

## 2022-08-08 MED ORDER — LOSARTAN POTASSIUM 50 MG PO TABS
ORAL_TABLET | ORAL | 3 refills | Status: DC
  Filled 2022-08-08 – 2022-08-09 (×2): qty 180, 90d supply, fill #0
  Filled 2022-11-05: qty 180, 90d supply, fill #1
  Filled 2023-02-11: qty 180, 90d supply, fill #2
  Filled 2023-05-06: qty 180, 90d supply, fill #3

## 2022-08-09 ENCOUNTER — Other Ambulatory Visit (HOSPITAL_COMMUNITY): Payer: Self-pay

## 2022-08-21 ENCOUNTER — Other Ambulatory Visit (HOSPITAL_BASED_OUTPATIENT_CLINIC_OR_DEPARTMENT_OTHER): Payer: Self-pay

## 2022-09-09 ENCOUNTER — Other Ambulatory Visit (HOSPITAL_COMMUNITY): Payer: Self-pay

## 2022-09-18 ENCOUNTER — Ambulatory Visit (INDEPENDENT_AMBULATORY_CARE_PROVIDER_SITE_OTHER): Payer: Worker's Compensation | Admitting: Family Medicine

## 2022-09-18 ENCOUNTER — Encounter: Payer: Self-pay | Admitting: Family Medicine

## 2022-09-18 ENCOUNTER — Other Ambulatory Visit (HOSPITAL_COMMUNITY): Payer: Self-pay

## 2022-09-18 VITALS — BP 135/78 | HR 80 | Ht 73.0 in | Wt 191.0 lb

## 2022-09-18 DIAGNOSIS — M25562 Pain in left knee: Secondary | ICD-10-CM

## 2022-09-18 DIAGNOSIS — M545 Low back pain, unspecified: Secondary | ICD-10-CM

## 2022-09-18 DIAGNOSIS — S80212A Abrasion, left knee, initial encounter: Secondary | ICD-10-CM | POA: Diagnosis not present

## 2022-09-18 DIAGNOSIS — Z23 Encounter for immunization: Secondary | ICD-10-CM | POA: Diagnosis not present

## 2022-09-18 DIAGNOSIS — M25522 Pain in left elbow: Secondary | ICD-10-CM

## 2022-09-18 DIAGNOSIS — S50312A Abrasion of left elbow, initial encounter: Secondary | ICD-10-CM

## 2022-09-18 DIAGNOSIS — M25512 Pain in left shoulder: Secondary | ICD-10-CM

## 2022-09-18 MED ORDER — TIZANIDINE HCL 4 MG PO TABS
4.0000 mg | ORAL_TABLET | Freq: Two times a day (BID) | ORAL | 0 refills | Status: AC
  Filled 2022-09-18: qty 30, 15d supply, fill #0

## 2022-09-18 MED ORDER — MELOXICAM 15 MG PO TABS
15.0000 mg | ORAL_TABLET | Freq: Every day | ORAL | 0 refills | Status: DC | PRN
  Filled 2022-09-18: qty 90, 90d supply, fill #0

## 2022-09-18 NOTE — Patient Instructions (Signed)
Recommend ice to the affected areas for 5 to 10 minutes at a time every few hours if possible. OK to switch to heat after about 4 to 5 days if that feels better. Meloxicam can be taken once a day with food and water to avoid GI upset.  Okay to use extra strength Tylenol on top of that if needed. Relaxers can be sedating so just be aware of how it is making you feel when you take it.  Preferably take in the evenings once you are home. Placed a physical therapy request for [facility] on [employer].  If for some reason they do not contact you within the next 48 hours please let us know.
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
## 2022-09-21 ENCOUNTER — Ambulatory Visit: Payer: Self-pay

## 2022-09-21 ENCOUNTER — Other Ambulatory Visit (HOSPITAL_COMMUNITY): Payer: Self-pay

## 2022-09-21 ENCOUNTER — Ambulatory Visit: Payer: 59 | Admitting: Family Medicine

## 2022-09-21 VITALS — BP 102/74 | HR 82 | Ht 73.0 in | Wt 192.0 lb

## 2022-09-21 DIAGNOSIS — M9904 Segmental and somatic dysfunction of sacral region: Secondary | ICD-10-CM | POA: Diagnosis not present

## 2022-09-21 DIAGNOSIS — M9901 Segmental and somatic dysfunction of cervical region: Secondary | ICD-10-CM

## 2022-09-21 DIAGNOSIS — S5002XA Contusion of left elbow, initial encounter: Secondary | ICD-10-CM | POA: Diagnosis not present

## 2022-09-21 DIAGNOSIS — M9908 Segmental and somatic dysfunction of rib cage: Secondary | ICD-10-CM | POA: Diagnosis not present

## 2022-09-21 DIAGNOSIS — M9903 Segmental and somatic dysfunction of lumbar region: Secondary | ICD-10-CM

## 2022-09-21 DIAGNOSIS — M25512 Pain in left shoulder: Secondary | ICD-10-CM

## 2022-09-21 DIAGNOSIS — M9902 Segmental and somatic dysfunction of thoracic region: Secondary | ICD-10-CM

## 2022-09-21 DIAGNOSIS — M5416 Radiculopathy, lumbar region: Secondary | ICD-10-CM | POA: Diagnosis not present

## 2022-09-21 MED ORDER — ATORVASTATIN CALCIUM 20 MG PO TABS
20.0000 mg | ORAL_TABLET | Freq: Every day | ORAL | 3 refills | Status: DC
  Filled 2022-09-21: qty 90, 90d supply, fill #0
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
## 2022-09-21 NOTE — Patient Instructions (Addendum)
Arnica lotion Take deep breaths when you can Take zanaflex nightly for 3-4 nights See me in 5-6 weeks

## 2022-09-22 ENCOUNTER — Other Ambulatory Visit (HOSPITAL_COMMUNITY): Payer: Self-pay

## 2022-09-22 ENCOUNTER — Encounter: Payer: Self-pay | Admitting: Family Medicine
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
## 2022-11-02 ENCOUNTER — Ambulatory Visit: Payer: 59 | Admitting: Family Medicine

## 2022-11-02 VITALS — BP 134/84 | HR 68 | Ht 73.0 in | Wt 194.0 lb

## 2022-11-02 DIAGNOSIS — M5416 Radiculopathy, lumbar region: Secondary | ICD-10-CM

## 2022-11-02 DIAGNOSIS — M9901 Segmental and somatic dysfunction of cervical region: Secondary | ICD-10-CM | POA: Diagnosis not present

## 2022-11-02 DIAGNOSIS — M9908 Segmental and somatic dysfunction of rib cage: Secondary | ICD-10-CM | POA: Diagnosis not present

## 2022-11-02 DIAGNOSIS — M9902 Segmental and somatic dysfunction of thoracic region: Secondary | ICD-10-CM | POA: Diagnosis not present

## 2022-11-02 DIAGNOSIS — M9904 Segmental and somatic dysfunction of sacral region: Secondary | ICD-10-CM

## 2022-11-02 DIAGNOSIS — M9903 Segmental and somatic dysfunction of lumbar region: Secondary | ICD-10-CM
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
## 2022-11-02 NOTE — Patient Instructions (Signed)
Great to see you as always Enjoy charlie brown See me in 6-8 weeks

## 2022-11-06 ENCOUNTER — Other Ambulatory Visit (HOSPITAL_COMMUNITY): Payer: Self-pay

## 2022-11-13 ENCOUNTER — Encounter: Payer: Self-pay | Admitting: Family Medicine

## 2022-11-13 NOTE — Progress Notes (Deleted)
[place] Phone: [phone] Subjective:    I'm seeing this patient by the request  of:  Rice, Wayland Denis, MD  CC:   [id number]  Larry [name] is a 61 y.o. male coming in with complaint of R wrist pain. Patient states   Onset-  Location Duration-  Character- Aggravating factors- Reliving factors-  Therapies tried-  Severity-     Past Medical History:  Diagnosis Date   High cholesterol    Hyperlipidemia    Hypertension    Past Surgical History:  Procedure Laterality Date   APPENDECTOMY     Social History   Socioeconomic History   Marital status: Married    Spouse name: Not on file   Number of children: Not on file   Years of education: Not on file   Highest education level: Not on file  Occupational History   Not on file  Tobacco Use   Smoking status: Never   Smokeless tobacco: Never  Substance and Sexual Activity   Alcohol use: Yes    Comment: 2 q wk, socially   Drug use: No   Sexual activity: Not on file  Other Topics Concern   Not on file  Social History Narrative   Not on file   Social Determinants of Health   Financial Resource Strain: Not on file  Food Insecurity: Not on file  Transportation Needs: Not on file  Physical Activity: Not on file  Stress: Not on file  Social Connections: Not on file   No Known Allergies Family History  Problem Relation Age of Onset   Hyperlipidemia Mother    Hyperlipidemia Father      Current Outpatient Medications (Cardiovascular):    amLODipine (NORVASC) 5 MG tablet, Take 1 tablet by mouth once daily   atorvastatin (LIPITOR) 20 MG tablet, Take 1 tablet (20 mg total) by mouth daily for cholesterol.   candesartan-hydrochlorothiazide (ATACAND HCT) 32-12.5 MG tablet, Take 1 tablet by mouth daily.   hydrochlorothiazide (MICROZIDE) 12.5 MG capsule, TAKE 1 CAPSULE (12.5 MG TOTAL) BY MOUTH DAILY FOR BLOOD PRESSURE   losartan  (COZAAR) 50 MG tablet, Take 1 tablet (50 mg total) by mouth 2 times daily.   rosuvastatin (CRESTOR) 20 MG tablet, Take 1 tablet by mouth daily for cholesterol   simvastatin (ZOCOR) 20 MG tablet, TAKE 1 TABLET BY MOUTH NIGHTLY   simvastatin (ZOCOR) 40 MG tablet, Take 1 tablet (40 mg total) by mouth daily for cholesterol  Current Outpatient Medications (Respiratory):    fluticasone (FLONASE) 50 MCG/ACT nasal spray, Use 2 sprays into both nostrils daily.  Current Outpatient Medications (Analgesics):    meloxicam (MOBIC) 15 MG tablet, Take 1 tablet (15 mg total) by mouth daily as needed for pain.   Current Outpatient Medications (Other):    ALPRAZolam (XANAX) 0.5 MG tablet, Take 1 tablet by mouth every 6 to 8 hours if needed for anxiety   COVID-19 mRNA bivalent vaccine, Pfizer, (PFIZER COVID-19 VAC BIVALENT) injection, Inject into the muscle.   hydrOXYzine (ATARAX) 10 MG tablet, Take 1 tablet (10 mg total) by mouth 3 (three) times daily as needed. (Patient not taking: Reported on 11/02/2022)   polyethylene glycol-electrolytes (NULYTELY) 420 g solution, Use as directed   tiZANidine (ZANAFLEX) 4 MG tablet, Take 1 tablet (4 mg total) by mouth in the morning and at bedtime.   Reviewed prior external information including notes and imaging from  primary care provider As well as notes that  were available from care everywhere and other healthcare systems.  Past medical history, social, surgical and family history all reviewed in electronic medical record.  No pertanent information unless stated regarding to the chief complaint.   Review of Systems:  No headache, visual changes, nausea, vomiting, diarrhea, constipation, dizziness, abdominal pain, skin rash, fevers, chills, night sweats, weight loss, swollen lymph nodes, body aches, joint swelling, chest pain, shortness of breath, mood changes. POSITIVE muscle aches  Objective  There were no vitals taken for this visit.   General: No apparent  distress alert and oriented x3 mood and affect normal, dressed appropriately.  HEENT: Pupils equal, extraocular movements intact  Respiratory: Patient's speak in full sentences and does not appear short of breath  Cardiovascular: No lower extremity edema, non tender, no erythema      Impression and Recommendations:

## 2022-11-14 ENCOUNTER — Ambulatory Visit: Payer: Self-pay

## 2022-11-14 ENCOUNTER — Other Ambulatory Visit (HOSPITAL_COMMUNITY): Payer: Self-pay

## 2022-11-14 ENCOUNTER — Ambulatory Visit: Payer: 59 | Admitting: Family Medicine

## 2022-11-14 ENCOUNTER — Encounter: Payer: Self-pay | Admitting: Family Medicine

## 2022-11-14 VITALS — BP 118/84 | HR 84 | Ht 73.0 in

## 2022-11-14 DIAGNOSIS — M25531 Pain in right wrist: Secondary | ICD-10-CM | POA: Diagnosis not present

## 2022-11-14 MED ORDER — MELOXICAM 15 MG PO TABS
15.0000 mg | ORAL_TABLET | Freq: Every day | ORAL | 0 refills | Status: AC
  Filled 2022-11-14: qty 30, 30d supply, fill #0

## 2022-11-14 NOTE — Progress Notes (Signed)
[place] Phone: [phone] Subjective:    I'm seeing this patient by the request  of:  Bobbye Charleston, MD  CC: Right wrist pain  [id number]  Larry [name] is a 61 y.o. male coming in with complaint of R wrist pain for one week. Patient states that he was lifting weights and felt tension over the [facility] joint. Doing overhead movement. Pain is spreading into the thumb and pointer finger. Also some numbness in the tip of R thumb.   Also notes tension in R side of neck that radiates into tricep.       Past Medical History:  Diagnosis Date   High cholesterol    Hyperlipidemia    Hypertension    Past Surgical History:  Procedure Laterality Date   APPENDECTOMY     Social History   Socioeconomic History   Marital status: Married    Spouse name: Not on file   Number of children: Not on file   Years of education: Not on file   Highest education level: Not on file  Occupational History   Not on file  Tobacco Use   Smoking status: Never   Smokeless tobacco: Never  Substance and Sexual Activity   Alcohol use: Yes    Comment: 2 q wk, socially   Drug use: No   Sexual activity: Not on file  Other Topics Concern   Not on file  Social History Narrative   Not on file   Social Determinants of Health   Financial Resource Strain: Not on file  Food Insecurity: Not on file  Transportation Needs: Not on file  Physical Activity: Not on file  Stress: Not on file  Social Connections: Not on file   No Known Allergies Family History  Problem Relation Age of Onset   Hyperlipidemia Mother    Hyperlipidemia Father      Current Outpatient Medications (Cardiovascular):    amLODipine (NORVASC) 5 MG tablet, Take 1 tablet by mouth once daily   atorvastatin (LIPITOR) 20 MG tablet, Take 1 tablet (20 mg total) by mouth daily for cholesterol.   candesartan-hydrochlorothiazide (ATACAND HCT) 32-12.5 MG  tablet, Take 1 tablet by mouth daily.   hydrochlorothiazide (MICROZIDE) 12.5 MG capsule, TAKE 1 CAPSULE (12.5 MG TOTAL) BY MOUTH DAILY FOR BLOOD PRESSURE   losartan (COZAAR) 50 MG tablet, Take 1 tablet (50 mg total) by mouth 2 times daily.   rosuvastatin (CRESTOR) 20 MG tablet, Take 1 tablet by mouth daily for cholesterol   simvastatin (ZOCOR) 40 MG tablet, Take 1 tablet (40 mg total) by mouth daily for cholesterol   simvastatin (ZOCOR) 20 MG tablet, TAKE 1 TABLET BY MOUTH NIGHTLY  Current Outpatient Medications (Respiratory):    fluticasone (FLONASE) 50 MCG/ACT nasal spray, Use 2 sprays into both nostrils daily.  Current Outpatient Medications (Analgesics):    meloxicam (MOBIC) 15 MG tablet, Take 1 tablet (15 mg total) by mouth daily.   Current Outpatient Medications (Other):    ALPRAZolam (XANAX) 0.5 MG tablet, Take 1 tablet by mouth every 6 to 8 hours if needed for anxiety   COVID-19 mRNA bivalent vaccine, Pfizer, (PFIZER COVID-19 VAC BIVALENT) injection, Inject into the muscle.   hydrOXYzine (ATARAX) 10 MG tablet, Take 1 tablet (10 mg total) by mouth 3 (three) times daily as needed.   polyethylene glycol-electrolytes (NULYTELY) 420 g solution, Use as directed   tiZANidine (ZANAFLEX) 4 MG tablet, Take 1 tablet (4 mg total) by  mouth in the morning and at bedtime.   Reviewed prior external information including notes and imaging from  primary care provider As well as notes that were available from care everywhere and other healthcare systems.  Past medical history, social, surgical and family history all reviewed in electronic medical record.  No pertanent information unless stated regarding to the chief complaint.   Review of Systems:  No headache, visual changes, nausea, vomiting, diarrhea, constipation, dizziness, abdominal pain, skin rash, fevers, chills, night sweats, weight loss, swollen lymph nodes, body aches, joint swelling, chest pain, shortness of breath, mood changes.  POSITIVE muscle aches  Objective  Blood pressure 118/84, pulse 84, height '6\' 1"'$  (1.854 m), SpO2 99 %.   General: No apparent distress alert and oriented x3 mood and affect normal, dressed appropriately.  HEENT: Pupils equal, extraocular movements intact  Respiratory: Patient's speak in full sentences and does not appear short of breath  Cardiovascular: No lower extremity edema, non tender, no erythema  Right wrist exam does show some mild swelling noted of the first and second MCP.  Patient does have full range of motion though noted.  Patient does not have any significant discomfort over the scaphoid bone.  More pain over the lunate. Neck exam does have some mild loss of lordosis.  Does have some tenderness noted.  Negative Spurling's though noted.  Limited muscular skeletal ultrasound was performed and interpreted by Hulan Saas, M  Ultrasound does not show any significant hypoechoic changes of the joints.  Patient does have what appears to be a subluxation of the lunate bone noted a more dorsally.  Median nerve unremarkable Impression: Questionable subluxation of the lunate bone   Impression and Recommendations:     The above documentation has been reviewed and is accurate and complete Lyndal Pulley, DO

## 2022-11-14 NOTE — Assessment & Plan Note (Addendum)
Seems to be more subluxation noted of the lunate bone noted today.  We discussed with patient home exercises.  Which activities to do and which ones to avoid.  Discussed taping daily for the next week.  Worsening pain may need some x-rays but I do expect patient to do extremely well otherwise.  Follow-up again in 6 weeks.  Due to the acute pain will be meloxicam 15 mg daily for the next week and then as needed

## 2022-11-14 NOTE — Patient Instructions (Addendum)
Good to see you Coban to wrap wrist daily for next week Ok to lift but in neutral position Keep regular appt

## 2022-11-15 ENCOUNTER — Other Ambulatory Visit (HOSPITAL_COMMUNITY): Payer: Self-pay

## 2022-11-16 ENCOUNTER — Ambulatory Visit: Payer: 59 | Admitting: Family Medicine

## 2022-12-19 ENCOUNTER — Other Ambulatory Visit (HOSPITAL_COMMUNITY): Payer: Self-pay

## 2022-12-19 MED ORDER — ATORVASTATIN CALCIUM 20 MG PO TABS
20.0000 mg | ORAL_TABLET | Freq: Every day | ORAL | 3 refills | Status: AC
  Filled 2022-12-19: qty 90, 90d supply, fill #0
  Filled 2023-04-01: qty 90, 90d supply, fill #1
  Filled 2023-07-02: qty 90, 90d supply, fill #2
  Filled 2023-09-29: qty 90, 90d supply, fill #3

## 2022-12-19 NOTE — Progress Notes (Unsigned)
[place] [address] [place] Phone: [phone] Subjective:   [name], am serving as a scribe for Dr. Hulan Saas.  I'm seeing this patient by the request  of:  Bobbye Charleston, MD  CC: Wrist pain and back pain follow-up  [id number]  11/14/2022 Seems to be more subluxation noted of the lunate bone noted today.  We discussed with patient home exercises.  Which activities to do and which ones to avoid.  Discussed taping daily for the next week.  Worsening pain may need some x-rays but I do expect patient to do extremely well otherwise.  Follow-up again in 6 weeks.  Due to the acute pain will be meloxicam 15 mg daily for the next week and then as needed   Update 12/21/2022 Larry [name] is a 62 y.o. male coming in with complaint of R wrist pain. OMT at previous visits. Patient states wrist is doing better. No new issues.  Would state that his wrist is 90% better at this time.       Past Medical History:  Diagnosis Date   High cholesterol    Hyperlipidemia    Hypertension    Past Surgical History:  Procedure Laterality Date   APPENDECTOMY     Social History   Socioeconomic History   Marital status: Married    Spouse name: Not on file   Number of children: Not on file   Years of education: Not on file   Highest education level: Not on file  Occupational History   Not on file  Tobacco Use   Smoking status: Never   Smokeless tobacco: Never  Substance and Sexual Activity   Alcohol use: Yes    Comment: 2 q wk, socially   Drug use: No   Sexual activity: Not on file  Other Topics Concern   Not on file  Social History Narrative   Not on file   Social Determinants of Health   Financial Resource Strain: Not on file  Food Insecurity: Not on file  Transportation Needs: Not on file  Physical Activity: Not on file  Stress: Not on file  Social Connections: Not on file   No Known Allergies Family  History  Problem Relation Age of Onset   Hyperlipidemia Mother    Hyperlipidemia Father      Current Outpatient Medications (Cardiovascular):    amLODipine (NORVASC) 5 MG tablet, Take 1 tablet by mouth once daily   atorvastatin (LIPITOR) 20 MG tablet, Take 1 tablet (20 mg total) by mouth daily for cholesterol.   atorvastatin (LIPITOR) 20 MG tablet, Take 1 tablet (20 mg total) by mouth daily for cholesterol   candesartan-hydrochlorothiazide (ATACAND HCT) 32-12.5 MG tablet, Take 1 tablet by mouth daily.   hydrochlorothiazide (MICROZIDE) 12.5 MG capsule, TAKE 1 CAPSULE (12.5 MG TOTAL) BY MOUTH DAILY FOR BLOOD PRESSURE   losartan (COZAAR) 50 MG tablet, Take 1 tablet (50 mg total) by mouth 2 times daily.   rosuvastatin (CRESTOR) 20 MG tablet, Take 1 tablet by mouth daily for cholesterol   simvastatin (ZOCOR) 20 MG tablet, TAKE 1 TABLET BY MOUTH NIGHTLY   simvastatin (ZOCOR) 40 MG tablet, Take 1 tablet (40 mg total) by mouth daily for cholesterol  Current Outpatient Medications (Respiratory):    fluticasone (FLONASE) 50 MCG/ACT nasal spray, Use 2 sprays into both nostrils daily.  Current Outpatient Medications (Analgesics):    meloxicam (MOBIC) 15 MG tablet, Take 1 tablet (15 mg total) by mouth  daily.   Current Outpatient Medications (Other):    ALPRAZolam (XANAX) 0.5 MG tablet, Take 1 tablet by mouth every 6 to 8 hours if needed for anxiety   COVID-19 mRNA bivalent vaccine, Pfizer, (PFIZER COVID-19 VAC BIVALENT) injection, Inject into the muscle.   hydrOXYzine (ATARAX) 10 MG tablet, Take 1 tablet (10 mg total) by mouth 3 (three) times daily as needed.   polyethylene glycol-electrolytes (NULYTELY) 420 g solution, Use as directed   tiZANidine (ZANAFLEX) 4 MG tablet, Take 1 tablet (4 mg total) by mouth in the morning and at bedtime.   Reviewed prior external information including notes and imaging from  primary care provider As well as notes that were available from care everywhere and  other healthcare systems.  Past medical history, social, surgical and family history all reviewed in electronic medical record.  No pertanent information unless stated regarding to the chief complaint.   Review of Systems:  No headache, visual changes, nausea, vomiting, diarrhea, constipation, dizziness, abdominal pain, skin rash, fevers, chills, night sweats, weight loss, swollen lymph nodes, body aches, joint swelling, chest pain, shortness of breath, mood changes. POSITIVE muscle aches  Objective  Blood pressure 130/88, pulse 89, height '6\' 1"'$  (1.854 m), weight 195 lb (88.5 kg), SpO2 96 %.   General: No apparent distress alert and oriented x3 mood and affect normal, dressed appropriately.  HEENT: Pupils equal, extraocular movements intact  Respiratory: Patient's speak in full sentences and does not appear short of breath  Cardiovascular: No lower extremity edema, non tender, no erythema  Right wrist exam does have the range of motion noted.  Nontender on exam really.  The patient has no pain over the lunate bone at the moment.  Patient does have some tenderness to palpation noted in the paraspinal musculature of the lumbar spine.  Tightness also in the parascapular area right greater than left.  Limited muscular skeletal ultrasound was performed and interpreted by Hulan Saas, M  Limited ultrasound shows the patient's scaphoid lunate was intact.  Still has some very mild hypoechoic changes of the TFCC but otherwise fairly unremarkable. Impression: Interval improvement  Osteopathic findings C2 flexed rotated and side bent right T3 extended rotated and side bent right inhaled third rib T8 extended rotated and side bent left L2 flexed rotated and side bent right Sacrum right on right     Impression and Recommendations:    The above documentation has been reviewed and is accurate and complete Lyndal Pulley, DO

## 2022-12-21 ENCOUNTER — Ambulatory Visit: Payer: Commercial Managed Care - PPO | Admitting: Family Medicine

## 2022-12-21 ENCOUNTER — Ambulatory Visit: Payer: Self-pay

## 2022-12-21 VITALS — BP 130/88 | HR 89 | Ht 73.0 in | Wt 195.0 lb

## 2022-12-21 DIAGNOSIS — M9903 Segmental and somatic dysfunction of lumbar region: Secondary | ICD-10-CM

## 2022-12-21 DIAGNOSIS — M9902 Segmental and somatic dysfunction of thoracic region: Secondary | ICD-10-CM | POA: Diagnosis not present

## 2022-12-21 DIAGNOSIS — M9904 Segmental and somatic dysfunction of sacral region: Secondary | ICD-10-CM

## 2022-12-21 DIAGNOSIS — M9901 Segmental and somatic dysfunction of cervical region: Secondary | ICD-10-CM | POA: Diagnosis not present

## 2022-12-21 DIAGNOSIS — M999 Biomechanical lesion, unspecified: Secondary | ICD-10-CM

## 2022-12-21 DIAGNOSIS — M9908 Segmental and somatic dysfunction of rib cage: Secondary | ICD-10-CM

## 2022-12-21 DIAGNOSIS — M25531 Pain in right wrist: Secondary | ICD-10-CM | POA: Diagnosis not present

## 2022-12-21 NOTE — Assessment & Plan Note (Signed)

## 2022-12-21 NOTE — Assessment & Plan Note (Signed)
Significant improvement with the hypoechoic changes from previous exam and scaphoid lunate ligament is noted to be intact.  Patient does describe some numbness in the fingertips that is consistent with some mild carpal tunnel that is seen with some mild dilatation of the median nerve on ultrasound today.  Discussed with patient about potential bracing at night, given stretches by athletic trainer, follow-up with me again in 6 to 8 weeks

## 2022-12-21 NOTE — Patient Instructions (Signed)
Do prescribed exercises at least 3x a week Wrist looks good Wear brace at night for at least 2 weeks Can take gabapentin if numbness gets worse See you again in 6-8 weeks Get a vertical mouse

## 2023-01-20 ENCOUNTER — Other Ambulatory Visit (HOSPITAL_COMMUNITY): Payer: Self-pay

## 2023-01-22 ENCOUNTER — Other Ambulatory Visit (HOSPITAL_COMMUNITY): Payer: Self-pay

## 2023-01-22 MED ORDER — HYDROCHLOROTHIAZIDE 12.5 MG PO CAPS
12.5000 mg | ORAL_CAPSULE | Freq: Every day | ORAL | 3 refills | Status: DC
  Filled 2023-01-22: qty 90, 90d supply, fill #0
  Filled 2023-04-29: qty 90, 90d supply, fill #1
  Filled 2023-08-13: qty 90, 90d supply, fill #2
  Filled 2023-12-04: qty 90, 90d supply, fill #3

## 2023-01-29 ENCOUNTER — Encounter: Payer: Self-pay | Admitting: Family Medicine

## 2023-01-30 ENCOUNTER — Ambulatory Visit (INDEPENDENT_AMBULATORY_CARE_PROVIDER_SITE_OTHER): Payer: Commercial Managed Care - PPO

## 2023-01-30 ENCOUNTER — Ambulatory Visit: Payer: Commercial Managed Care - PPO | Admitting: Family Medicine

## 2023-01-30 ENCOUNTER — Ambulatory Visit: Payer: Self-pay

## 2023-01-30 ENCOUNTER — Other Ambulatory Visit: Payer: Self-pay

## 2023-01-30 VITALS — BP 120/84 | HR 78 | Ht 73.0 in | Wt 193.0 lb

## 2023-01-30 DIAGNOSIS — M25571 Pain in right ankle and joints of right foot: Secondary | ICD-10-CM | POA: Diagnosis not present

## 2023-01-30 DIAGNOSIS — M7989 Other specified soft tissue disorders: Secondary | ICD-10-CM | POA: Diagnosis not present

## 2023-01-30 DIAGNOSIS — S93492A Sprain of other ligament of left ankle, initial encounter: Secondary | ICD-10-CM | POA: Diagnosis not present

## 2023-01-30 DIAGNOSIS — M79671 Pain in right foot: Secondary | ICD-10-CM | POA: Diagnosis not present

## 2023-01-30 DIAGNOSIS — G8929 Other chronic pain: Secondary | ICD-10-CM | POA: Diagnosis not present

## 2023-01-30 DIAGNOSIS — S93491A Sprain of other ligament of right ankle, initial encounter: Secondary | ICD-10-CM

## 2023-01-30 NOTE — Patient Instructions (Signed)
Aircast Ice for 20 min Arnica lotion Do not start exercises until next week See me as scheduled

## 2023-01-30 NOTE — Progress Notes (Addendum)
[place] Phone: [phone] Subjective:   [name], am serving as a scribe for Dr. Hulan Saas.  I'm seeing this patient by the request  of:  Rice, Wayland Denis, MD  CC: right  ankle injury  [id number]  Larry [name] is a 62 y.o. male coming in with complaint of R ankle pain. Patient states that he might have over extended his foot last week. Had a weird sensation but walked it off. Over the weekend he was walking a lot over the weekend and the pain increased. Swelling still occurring today over lateral malleolus. No weakness or instability. During initial injury he did feel some instability.       Past Medical History:  Diagnosis Date   High cholesterol    Hyperlipidemia    Hypertension    Past Surgical History:  Procedure Laterality Date   APPENDECTOMY     Social History   Socioeconomic History   Marital status: Married    Spouse name: Not on file   Number of children: Not on file   Years of education: Not on file   Highest education level: Not on file  Occupational History   Not on file  Tobacco Use   Smoking status: Never   Smokeless tobacco: Never  Substance and Sexual Activity   Alcohol use: Yes    Comment: 2 q wk, socially   Drug use: No   Sexual activity: Not on file  Other Topics Concern   Not on file  Social History Narrative   Not on file   Social Determinants of Health   Financial Resource Strain: Not on file  Food Insecurity: Not on file  Transportation Needs: Not on file  Physical Activity: Not on file  Stress: Not on file  Social Connections: Not on file   No Known Allergies Family History  Problem Relation Age of Onset   Hyperlipidemia Mother    Hyperlipidemia Father      Current Outpatient Medications (Cardiovascular):    amLODipine (NORVASC) 5 MG tablet, Take 1 tablet by mouth once daily   atorvastatin (LIPITOR) 20 MG tablet, Take 1 tablet  (20 mg total) by mouth daily for cholesterol.   atorvastatin (LIPITOR) 20 MG tablet, Take 1 tablet (20 mg total) by mouth daily for cholesterol   candesartan-hydrochlorothiazide (ATACAND HCT) 32-12.5 MG tablet, Take 1 tablet by mouth daily.   hydrochlorothiazide (MICROZIDE) 12.5 MG capsule, Take 1 capsule (12.5 mg total) by mouth daily for blood pressure   losartan (COZAAR) 50 MG tablet, Take 1 tablet (50 mg total) by mouth 2 times daily.   rosuvastatin (CRESTOR) 20 MG tablet, Take 1 tablet by mouth daily for cholesterol   simvastatin (ZOCOR) 40 MG tablet, Take 1 tablet (40 mg total) by mouth daily for cholesterol   simvastatin (ZOCOR) 20 MG tablet, TAKE 1 TABLET BY MOUTH NIGHTLY  Current Outpatient Medications (Respiratory):    fluticasone (FLONASE) 50 MCG/ACT nasal spray, Use 2 sprays into both nostrils daily.  Current Outpatient Medications (Analgesics):    meloxicam (MOBIC) 15 MG tablet, Take 1 tablet (15 mg total) by mouth daily.   Current Outpatient Medications (Other):    ALPRAZolam (XANAX) 0.5 MG tablet, Take 1 tablet by mouth every 6 to 8 hours if needed for anxiety   COVID-19 mRNA bivalent vaccine, Pfizer, (PFIZER COVID-19 VAC BIVALENT) injection, Inject into the muscle.   hydrOXYzine (ATARAX) 10 MG tablet, Take 1 tablet (10 mg total)  by mouth 3 (three) times daily as needed.   polyethylene glycol-electrolytes (NULYTELY) 420 g solution, Use as directed   tiZANidine (ZANAFLEX) 4 MG tablet, Take 1 tablet (4 mg total) by mouth in the morning and at bedtime.   Reviewed prior external information including notes and imaging from  primary care provider As well as notes that were available from care everywhere and other healthcare systems.  Past medical history, social, surgical and family history all reviewed in electronic medical record.  No pertanent information unless stated regarding to the chief complaint.   Review of Systems:  No headache, visual changes, nausea, vomiting,  diarrhea, constipation, dizziness, abdominal pain, skin rash, fevers, chills, night sweats, weight loss, swollen lymph nodes, body aches, joint swelling, chest pain, shortness of breath, mood changes. POSITIVE muscle aches  Objective  Blood pressure 120/84, pulse 78, height '6\' 1"'$  (1.854 m), weight 193 lb (87.5 kg), SpO2 98 %.   General: No apparent distress alert and oriented x3 mood and affect normal, dressed appropriately.  HEENT: Pupils equal, extraocular movements intact  Respiratory: Patient's speak in full sentences and does not appear short of breath  Left ankle does have swelling noted.  Patient does have some tenderness to palpation over the syndesmosis but nothing significant over the talar dome.  Patient does have good range of motion noted.  Very minimal tenderness on the lateral aspect of the malleolus.  Limited muscular skeletal ultrasound was performed and interpreted by Hulan Saas, M  Limited ultrasound shows that there is some hypoechoic changes noted some trace effusion noted also of the anterior ankle mortise.  ATFL seen and does seem to be normal.  Does have some possible tearing though noted to the anterior syndesmosis. Impression: ATFL seems to be intact but does have possible stenosis injury   Impression and Recommendations:     The above documentation has been reviewed and is accurate and complete Lyndal Pulley, DO

## 2023-01-31 ENCOUNTER — Encounter: Payer: Self-pay | Admitting: Family Medicine

## 2023-01-31 DIAGNOSIS — S93491A Sprain of other ligament of right ankle, initial encounter: Secondary | ICD-10-CM | POA: Insufficient documentation

## 2023-01-31 DIAGNOSIS — S93492A Sprain of other ligament of left ankle, initial encounter: Secondary | ICD-10-CM | POA: Insufficient documentation

## 2023-01-31 NOTE — Assessment & Plan Note (Signed)
Patient does have signs and symptoms consistent with a syndesmosis injury noted.  Patient likely is able to ambulate but does have swelling noted.  We did discuss with patient about home exercises, about Aircast to avoid any type of twisting motion if for while here.  Patient has not traveled recently.  No shortness of breath or pain in the calf itself.  Follow-up with me after the conservative therapy and we will discuss further in 6 weeks.

## 2023-02-06 NOTE — Progress Notes (Unsigned)
[facility] [place] Phone: [phone] Subjective:   [name], am serving as a scribe for Dr. Hulan Saas.  I'm seeing this patient by the request  of:  Bobbye Charleston, MD  CC: Ankle pain follow-up,  [id number]  01/30/2023 Patient does have signs and symptoms consistent with a syndesmosis injury noted. Patient likely is able to ambulate but does have swelling noted. We did discuss with patient about home exercises, about Aircast to avoid any type of twisting motion if for while here. Patient has not traveled recently. No shortness of breath or pain in the calf itself. Follow-up with me after the conservative therapy and we will discuss further in 6 weeks.   Update 02/07/2023 KACESON BENALLY is a 62 y.o. male coming in with complaint of R ankle pain. Patient states that his ankle pain has subsided.   Palpable pain in lateral aspect of L hip and upper leg. Lumbar spine spasm on that side as well.   Carpal tunnel in R wrist is the same as last visit.       Past Medical History:  Diagnosis Date   High cholesterol    Hyperlipidemia    Hypertension    Past Surgical History:  Procedure Laterality Date   APPENDECTOMY     Social History   Socioeconomic History   Marital status: Married    Spouse name: Not on file   Number of children: Not on file   Years of education: Not on file   Highest education level: Not on file  Occupational History   Not on file  Tobacco Use   Smoking status: Never   Smokeless tobacco: Never  Substance and Sexual Activity   Alcohol use: Yes    Comment: 2 q wk, socially   Drug use: No   Sexual activity: Not on file  Other Topics Concern   Not on file  Social History Narrative   Not on file   Social Determinants of Health   Financial Resource Strain: Not on file  Food Insecurity: Not on file  Transportation Needs: Not on file  Physical Activity: Not on file   Stress: Not on file  Social Connections: Not on file   No Known Allergies Family History  Problem Relation Age of Onset   Hyperlipidemia Mother    Hyperlipidemia Father      Current Outpatient Medications (Cardiovascular):    amLODipine (NORVASC) 5 MG tablet, Take 1 tablet by mouth once daily   atorvastatin (LIPITOR) 20 MG tablet, Take 1 tablet (20 mg total) by mouth daily for cholesterol.   atorvastatin (LIPITOR) 20 MG tablet, Take 1 tablet (20 mg total) by mouth daily for cholesterol   candesartan-hydrochlorothiazide (ATACAND HCT) 32-12.5 MG tablet, Take 1 tablet by mouth daily.   hydrochlorothiazide (MICROZIDE) 12.5 MG capsule, Take 1 capsule (12.5 mg total) by mouth daily for blood pressure   losartan (COZAAR) 50 MG tablet, Take 1 tablet (50 mg total) by mouth 2 times daily.   rosuvastatin (CRESTOR) 20 MG tablet, Take 1 tablet by mouth daily for cholesterol   simvastatin (ZOCOR) 40 MG tablet, Take 1 tablet (40 mg total) by mouth daily for cholesterol   simvastatin (ZOCOR) 20 MG tablet, TAKE 1 TABLET BY MOUTH NIGHTLY  Current Outpatient Medications (Respiratory):    fluticasone (FLONASE) 50 MCG/ACT nasal spray, Use 2 sprays into both nostrils daily.  Current Outpatient Medications (Analgesics):    meloxicam (MOBIC) 15 MG tablet,  Take 1 tablet (15 mg total) by mouth daily.   Current Outpatient Medications (Other):    ALPRAZolam (XANAX) 0.5 MG tablet, Take 1 tablet by mouth every 6 to 8 hours if needed for anxiety   COVID-19 mRNA bivalent vaccine, Pfizer, (PFIZER COVID-19 VAC BIVALENT) injection, Inject into the muscle.   hydrOXYzine (ATARAX) 10 MG tablet, Take 1 tablet (10 mg total) by mouth 3 (three) times daily as needed.   polyethylene glycol-electrolytes (NULYTELY) 420 g solution, Use as directed   tiZANidine (ZANAFLEX) 4 MG tablet, Take 1 tablet (4 mg total) by mouth in the morning and at bedtime.   Reviewed prior external information including notes and imaging from   primary care provider As well as notes that were available from care everywhere and other healthcare systems.  Past medical history, social, surgical and family history all reviewed in electronic medical record.  No pertanent information unless stated regarding to the chief complaint.   Review of Systems:  No headache, visual changes, nausea, vomiting, diarrhea, constipation, dizziness, abdominal pain, skin rash, fevers, chills, night sweats, weight loss, swollen lymph nodes, body aches, joint swelling, chest pain, shortness of breath, mood changes. POSITIVE muscle aches  Objective  Blood pressure 118/82, pulse 84, height '6\' 1"'$  (1.854 m), weight 195 lb (88.5 kg), SpO2 99 %.   General: No apparent distress alert and oriented x3 mood and affect normal, dressed appropriately.  HEENT: Pupils equal, extraocular movements intact  Respiratory: Patient's speak in full sentences and does not appear short of breath  Cardiovascular: No lower extremity edema, non tender, no erythema  Right ankle does have good range of motion noted. Low back does have tightness noted.  Tenderness to palpation over the sacroiliac joints bilaterally Patient does have more tenderness over the left sacroiliac joint.  Patient does have good range of motion of the ankle.  Negative straight leg test noted..   Limited muscular skeletal ultrasound was performed and interpreted by Hulan Saas, M   Clinical exam shows no significant hypoechoic changes at this point.  Seems to have improvement in the stenosis from previous exam. Impression: Significant improvement  Osteopathic findings C4 flexed rotated and side bent left C6 flexed rotated and side bent left T3 extended rotated and side bent right inhaled third rib T9 extended rotated and side bent left L2 flexed rotated and side bent right Sacrum left on left Pelvic shear noted   Impression and Recommendations:     The above documentation has been reviewed and is  accurate and complete Lyndal Pulley, DO

## 2023-02-08 ENCOUNTER — Ambulatory Visit: Payer: Commercial Managed Care - PPO | Admitting: Family Medicine

## 2023-02-08 ENCOUNTER — Other Ambulatory Visit: Payer: Self-pay

## 2023-02-08 ENCOUNTER — Encounter: Payer: Self-pay | Admitting: Family Medicine

## 2023-02-08 VITALS — BP 118/82 | HR 84 | Ht 73.0 in | Wt 195.0 lb

## 2023-02-08 DIAGNOSIS — M25571 Pain in right ankle and joints of right foot: Secondary | ICD-10-CM | POA: Diagnosis not present

## 2023-02-08 DIAGNOSIS — M9908 Segmental and somatic dysfunction of rib cage: Secondary | ICD-10-CM

## 2023-02-08 DIAGNOSIS — M999 Biomechanical lesion, unspecified: Secondary | ICD-10-CM

## 2023-02-08 DIAGNOSIS — M9903 Segmental and somatic dysfunction of lumbar region: Secondary | ICD-10-CM | POA: Diagnosis not present

## 2023-02-08 DIAGNOSIS — M9904 Segmental and somatic dysfunction of sacral region: Secondary | ICD-10-CM

## 2023-02-08 DIAGNOSIS — M9902 Segmental and somatic dysfunction of thoracic region: Secondary | ICD-10-CM

## 2023-02-08 DIAGNOSIS — M9901 Segmental and somatic dysfunction of cervical region: Secondary | ICD-10-CM | POA: Diagnosis not present

## 2023-02-08 DIAGNOSIS — M5416 Radiculopathy, lumbar region: Secondary | ICD-10-CM | POA: Diagnosis not present

## 2023-02-08 DIAGNOSIS — S93491A Sprain of other ligament of right ankle, initial encounter: Secondary | ICD-10-CM | POA: Diagnosis not present

## 2023-02-08 NOTE — Patient Instructions (Signed)
Ankle looks great See me in 6 weeks

## 2023-02-08 NOTE — Assessment & Plan Note (Signed)
Seems to have completely resolved at this time.  There is a trace amount of effusion of the ankle noted.  Has done well and is able to wear regular shoes without any significant discomfort.  Will continue to monitor but seems to be doing okay.  Follow-up with me again in 6 to 8 weeks

## 2023-02-08 NOTE — Assessment & Plan Note (Signed)
Usually right-sided but had more tightness on the left side of the back.  Seems to be more secondary to the pelvic shift noted today.  We discussed with patient monitoring certain activities at the moment.  Increase activity slowly.  Follow-up with me again in 6 to 8 weeks.

## 2023-02-08 NOTE — Assessment & Plan Note (Signed)

## 2023-02-12 ENCOUNTER — Other Ambulatory Visit (HOSPITAL_COMMUNITY): Payer: Self-pay

## 2023-02-15 ENCOUNTER — Ambulatory Visit: Payer: Commercial Managed Care - PPO | Admitting: Dermatology

## 2023-02-15 ENCOUNTER — Encounter: Payer: Self-pay | Admitting: Dermatology

## 2023-02-15 DIAGNOSIS — L821 Other seborrheic keratosis: Secondary | ICD-10-CM

## 2023-02-15 DIAGNOSIS — D1801 Hemangioma of skin and subcutaneous tissue: Secondary | ICD-10-CM | POA: Diagnosis not present

## 2023-02-15 DIAGNOSIS — L723 Sebaceous cyst: Secondary | ICD-10-CM | POA: Diagnosis not present

## 2023-02-15 NOTE — Progress Notes (Signed)
   New Patient Visit  Subjective  Larry [name] is a 62 y.o. male who presents for the following: Annual Exam (C/o boils on back. No active ones now but has been a frequent problem. Sometimes they have to be drained. Very painful when flares and gets them about twice a year. Last dermatology appointment about a year ago. No hx of skin cancer and no family hx of skin cancer. Last flare was about 2 years ago. Uses Dial style soap and Cetaphil lotion. Took Accutane in younger years and responded well).    Objective  Well appearing patient in no apparent distress; mood and affect are within normal limits.  A full examination was performed including scalp, head, eyes, ears, nose, lips, neck, chest, axillae, abdomen, back, buttocks, bilateral upper extremities, bilateral lower extremities, hands, feet, fingers, toes, fingernails, and toenails. All findings within normal limits unless otherwise noted below.  A dermatoscope was used during the exam.  The following people were also present during my examination: , my medical assistant (male)   Mid Back Multiple scattered cystic subcutaneous nodules some with dilated open pores   Right Anterior Neck Owens Shark / black pedunculated papules    Assessment & Plan  Sebaceous cyst Mid Back  Patient has a history of severe acne and cysts. Took Accutane as a young adult and responded well. Cysts are currently stable with no recent infections, recommended he follow up in the future with any signs of infection  Dermatosis papulosa nigra Right Anterior Neck  Reassured normal. Informed cosmetic removal in the future if desired      Seborrheic Keratoses - Stuck-on, waxy, tan-brown papules and/or plaques  - Benign-appearing - Discussed benign etiology and prognosis. - Observe - Call for any changes  Melanocytic Nevi - Tan-brown and/or pink-flesh-colored symmetric macules and papules - Benign appearing on exam today - Observation - Call  clinic for new or changing moles - Recommend daily use of broad spectrum spf 30+ sunscreen to sun-exposed areas.   Hemangiomas - Red papules - Discussed benign nature - Observe - Call for any changes  Mild Actinic Damage (Bilateral Temples) - Chronic condition, secondary to cumulative UV/sun exposure - diffuse scaly erythematous macules with underlying dyspigmentation - Recommend daily broad spectrum sunscreen SPF 30+ to sun-exposed areas, reapply every 2 hours as needed.  - Staying in the shade or wearing long sleeves, sun glasses (UVA+UVB protection) and wide brim hats (4-inch brim around the entire circumference of the hat) are also recommended for sun protection.  - Call for new or changing lesions.  Skin cancer screening performed today.  Return in about 1 year (around 02/15/2024) for Anual Skin Exam.  I, Zigmund Gottron, CMA, am acting as scribe for Ellard Artis, MD.

## 2023-02-15 NOTE — Patient Instructions (Addendum)
Due to recent changes in healthcare laws, you may see results of your pathology and/or laboratory studies on MyChart before the doctors have had a chance to review them. We understand that in some cases there may be results that are confusing or concerning to you. Please understand that not all results are received at the same time and often the doctors may need to interpret multiple results in order to provide you with the best plan of care or course of treatment. Therefore, we ask that you please give [place] 2 business days to thoroughly review all your results before contacting the office for clarification. Should we see a critical lab result, you will be contacted sooner.   If You Need Anything After Your Visit  If you have any questions or concerns for your doctor, please call our main line at [phone] If no one answers, please leave a voicemail as directed and we will return your call as soon as possible. Messages left after 4 pm will be answered the following business day.   You may also send [place] a message via [place]. We typically respond to MyChart messages within 1-2 business days.  For prescription refills, please ask your pharmacy to contact our office. Our fax number is [phone].  If you have an urgent issue when the clinic is closed that cannot wait until the next business day, you can page your doctor at the number below.    Please note that while we do our best to be available for urgent issues outside of office hours, we are not available 24/7.   If you have an urgent issue and are unable to reach [place], you may choose to seek medical care at your doctor's office, retail clinic, urgent care center, or emergency room.  If you have a medical emergency, please immediately call 911 or go to the emergency department. In the event of inclement weather, please call our main line at [phone] for an update on the status of any delays or closures.  Dermatology Medication Tips: Please  keep the boxes that topical medications come in in order to help keep track of the instructions about where and how to use these. Pharmacies typically print the medication instructions only on the boxes and not directly on the medication tubes.   If your medication is too expensive, please contact our office at [phone] or send [place] a message through [place].   We are unable to tell what your co-pay for medications will be in advance as this is different depending on your insurance coverage. However, we may be able to find a substitute medication at lower cost or fill out paperwork to get insurance to cover a needed medication.   If a prior authorization is required to get your medication covered by your insurance company, please allow [place] 1-2 business days to complete this process.  Drug prices often vary depending on where the prescription is filled and some pharmacies may offer cheaper prices.  The website www.goodrx.com contains coupons for medications through different pharmacies. The prices here do not account for what the cost may be with help from insurance (it may be cheaper with your insurance), but the website can give you the price if you did not use any insurance.  - You can print the associated coupon and take it with your prescription to the pharmacy.  - You may also stop by our office during regular business hours and pick up a GoodRx coupon card.  - If you need your  prescription sent electronically to a different pharmacy, notify our office through [facility] or by phone at [phone]

## 2023-03-21 NOTE — Progress Notes (Unsigned)
[name] [address] [place] [address] Phone: [phone] Subjective:   Larry [name], am serving as a scribe for Dr. Antoine Primas.  I'm seeing this patient by the request  of:  Rice, Carollee Leitz, MD  CC: low back pain   [id number]  Larry [name] is a 62 y.o. male coming in with complaint of back and neck pain. OMT on 02/08/2023. Patient states that his carpal tunnel is no better in R wrist.   Also c/o stiffness in B quads in quad tendons after sitting for a prolonged period.   Medications patient has been prescribed:   Taking:         Reviewed prior external information including notes and imaging from previsou exam, outside providers and external EMR if available.   As well as notes that were available from care everywhere and other healthcare systems.  Past medical history, social, surgical and family history all reviewed in electronic medical record.  No pertanent information unless stated regarding to the chief complaint.   Past Medical History:  Diagnosis Date   High cholesterol    Hyperlipidemia    Hypertension     Allergies  Allergen Reactions   Amlodipine Swelling    Significant ankle edema     Review of Systems:  No headache, visual changes, nausea, vomiting, diarrhea, constipation, dizziness, abdominal pain, skin rash, fevers, chills, night sweats, weight loss, swollen lymph nodes, body aches, joint swelling, chest pain, shortness of breath, mood changes. POSITIVE muscle aches  Objective  Blood pressure 120/72, pulse 80, height  (1.854 m), weight 193 lb (87.5 kg), SpO2 98 %.   General: No apparent distress alert and oriented x3 mood and affect normal, dressed appropriately.  HEENT: Pupils equal, extraocular movements intact  Respiratory: Patient's speak in full sentences and does not appear short of breath  Cardiovascular: No lower extremity edema, non tender, no erythema  MSK:  Back low back  still has some tightness noted in the paraspinal musculature. Patient's knees bilaterally do have some mild crepitus mild lateral tracking but no significant effusion.  Nontender on exam today. Right wrist exam still has negative Tinel's but does seem to have more discomfort in that vicinity of the median nerve anywhere else on the wrist.  Good range of motion of the wrist noted.  Osteopathic findings  C3 flexed rotated and side bent right C7 flexed rotated and side bent left T3 extended rotated and side bent right inhaled rib T9 extended rotated and side bent left L2 flexed rotated and side bent right Sacrum right on right     Assessment and Plan:  Right wrist pain Discussed with patient that there is a possibility for carpal tunnel.  Restart the gabapentin.  See if this responds well to this.  Follow-up with me again in 6 to 8 weeks  Bilateral knee pain Acute worsening.  X-rays ordered today.  Discussed which activities to do and which ones to avoid.  Discussed topical anti-inflammatories if needed or possible oral anti-inflammatories such as meloxicam.  Patient does have a strong family history of rheumatoid arthritis and may need other laboratory workup but have done it previously with negative results.  Follow-up with me again in 6 to 8 weeks otherwise    Nonallopathic problems  Decision today to treat with OMT was based on Physical Exam  After verbal consent patient was treated with HVLA, ME, FPR techniques in cervical, rib, thoracic, lumbar, and sacral  areas  Patient tolerated the procedure well with improvement in symptoms  Patient given exercises, stretches and lifestyle modifications  See medications in patient instructions if given  Patient will follow up in 4-8 weeks     The above documentation has been reviewed and is accurate and complete Judi Saa, DO         Note: This dictation was prepared with Dragon dictation along with smaller phrase  technology. Any transcriptional errors that result from this process are unintentional.

## 2023-03-22 ENCOUNTER — Encounter: Payer: Self-pay | Admitting: Family Medicine

## 2023-03-22 ENCOUNTER — Ambulatory Visit: Payer: Commercial Managed Care - PPO | Admitting: Family Medicine

## 2023-03-22 ENCOUNTER — Other Ambulatory Visit (HOSPITAL_COMMUNITY): Payer: Self-pay

## 2023-03-22 ENCOUNTER — Ambulatory Visit (INDEPENDENT_AMBULATORY_CARE_PROVIDER_SITE_OTHER): Payer: Commercial Managed Care - PPO

## 2023-03-22 VITALS — BP 120/72 | HR 80 | Ht 73.0 in | Wt 193.0 lb

## 2023-03-22 DIAGNOSIS — M25561 Pain in right knee: Secondary | ICD-10-CM

## 2023-03-22 DIAGNOSIS — M9904 Segmental and somatic dysfunction of sacral region: Secondary | ICD-10-CM | POA: Diagnosis not present

## 2023-03-22 DIAGNOSIS — M9903 Segmental and somatic dysfunction of lumbar region: Secondary | ICD-10-CM | POA: Diagnosis not present

## 2023-03-22 DIAGNOSIS — M9908 Segmental and somatic dysfunction of rib cage: Secondary | ICD-10-CM | POA: Diagnosis not present

## 2023-03-22 DIAGNOSIS — M25562 Pain in left knee: Secondary | ICD-10-CM

## 2023-03-22 DIAGNOSIS — M9901 Segmental and somatic dysfunction of cervical region: Secondary | ICD-10-CM | POA: Diagnosis not present

## 2023-03-22 DIAGNOSIS — M25531 Pain in right wrist: Secondary | ICD-10-CM

## 2023-03-22 DIAGNOSIS — M9902 Segmental and somatic dysfunction of thoracic region: Secondary | ICD-10-CM

## 2023-03-22 MED ORDER — MELOXICAM 15 MG PO TABS
15.0000 mg | ORAL_TABLET | Freq: Every day | ORAL | 0 refills | Status: AC
  Filled 2023-03-22: qty 30, 30d supply, fill #0

## 2023-03-22 MED ORDER — GABAPENTIN 100 MG PO CAPS
200.0000 mg | ORAL_CAPSULE | Freq: Every day | ORAL | 0 refills | Status: AC
  Filled 2023-03-22: qty 180, 90d supply, fill #0

## 2023-03-22 NOTE — Assessment & Plan Note (Signed)
Discussed with patient that there is a possibility for carpal tunnel.  Restart the gabapentin.  See if this responds well to this.  Follow-up with me again in 6 to 8 weeks

## 2023-03-22 NOTE — Patient Instructions (Addendum)
Meloxicam  prescribed Gabapentin  prescribed Xrays today [place] See you again in 6 weeks

## 2023-03-22 NOTE — Assessment & Plan Note (Signed)
Acute worsening.  X-rays ordered today.  Discussed which activities to do and which ones to avoid.  Discussed topical anti-inflammatories if needed or possible oral anti-inflammatories such as meloxicam.  Patient does have a strong family history of rheumatoid arthritis and may need other laboratory workup but have done it previously with negative results.  Follow-up with me again in 6 to 8 weeks otherwise

## 2023-03-27 ENCOUNTER — Encounter: Payer: Self-pay | Admitting: Family Medicine

## 2023-04-25 DIAGNOSIS — H6123 Impacted cerumen, bilateral: Secondary | ICD-10-CM | POA: Diagnosis not present

## 2023-05-03 NOTE — Progress Notes (Unsigned)
[name] [address] [place] [address] Phone: [phone] Subjective:   [name], am serving as a scribe for Dr. Antoine Primas.  I'm seeing this patient by the request  of:  Rice, Carollee Leitz, MD  CC: Neck and back pain follow-up  [id number]  Larry [name] is a 62 y.o. male coming in with complaint of back and neck pain. OMT on 03/22/2023. Also seen for R knee pain. Patient states that the gabapentin has been helpful to reduce his knee pain.   Pain in R side of lower back for past week. No using anything for pain.   No change in pain in R CMC joint.   Believes that he might have an ear infection. Pain began Thursday of this past week. Hx of ear infections.   Medications patient has been prescribed: Mobic Gabapentin  Taking: yes         Reviewed prior external information including notes and imaging from previsou exam, outside providers and external EMR if available.   As well as notes that were available from care everywhere and other healthcare systems.  Past medical history, social, surgical and family history all reviewed in electronic medical record.  No pertanent information unless stated regarding to the chief complaint.   Past Medical History:  Diagnosis Date   High cholesterol    Hyperlipidemia    Hypertension     Allergies  Allergen Reactions   Amlodipine Swelling    Significant ankle edema     Review of Systems:  No headache, visual changes, nausea, vomiting, diarrhea, constipation, dizziness, abdominal pain, skin rash, fevers, chills, night sweats, weight loss, swollen lymph nodes, body aches, joint swelling, chest pain, shortness of breath, mood changes. POSITIVE muscle aches  Objective  Blood pressure 110/80, pulse 86, height 6\' 1"  (1.854 m), weight 199 lb (90.3 kg), SpO2 98 %.   General: No apparent distress alert and oriented x3 mood and affect normal, dressed appropriately.  HEENT: Pupils  equal, extraocular movements intact  Respiratory: Patient's speak in full sentences and does not appear short of breath  Cardiovascular: No lower extremity edema, non tender, no erythema  Neck exam shows tightness noted on the right side.  Negative Spurling's  Back exam shows significant tightness noted on the right side of the back.  Tightness with straight leg test but no true radicular symptoms.  Right wrist exam shows patient does have some mild discomfort over the [facility] joint but no significant swelling.  Good grip strength noted.  Osteopathic findings  C2 flexed rotated and side bent right C7 flexed rotated and side bent left T3 extended rotated and side bent right inhaled rib T8 extended rotated and side bent left L2 flexed rotated and side bent right Sacrum right on right       Assessment and Plan:  Right wrist pain No significant hypoechoic changes noted at this time.  We will continue to monitor.  Seems to be the [facility] joint.  Discussed putting pressure on it with weight lifting.  Middle ear effusion Ear effusion noted, discussed Flonase to do twice daily over-the-counter and given Augmentin for any type of signs of sinusitis.  Increase activity as tolerated.  Follow-up with me again 6 to 8 weeks as ordered follow-up with ENT.  Right lumbar radiculitis Still responding well to osteopathic manipulation and will be traveling.  Muscle relaxer given.    Nonallopathic problems  Decision today to treat with OMT was based on Physical  Exam  After verbal consent patient was treated with HVLA, ME, FPR techniques in cervical, rib, thoracic, lumbar, and sacral  areas  Patient tolerated the procedure well with improvement in symptoms  Patient given exercises, stretches and lifestyle modifications  See medications in patient instructions if given  Patient will follow up in 4-8 weeks     The above documentation has been reviewed and is accurate and complete Judi Saa,  DO         Note: This dictation was prepared with Dragon dictation along with smaller phrase technology. Any transcriptional errors that result from this process are unintentional.

## 2023-05-08 ENCOUNTER — Other Ambulatory Visit: Payer: Self-pay

## 2023-05-08 ENCOUNTER — Encounter: Payer: Self-pay | Admitting: Family Medicine

## 2023-05-08 ENCOUNTER — Ambulatory Visit: Payer: Commercial Managed Care - PPO | Admitting: Family Medicine

## 2023-05-08 ENCOUNTER — Other Ambulatory Visit (HOSPITAL_COMMUNITY): Payer: Self-pay

## 2023-05-08 VITALS — BP 110/80 | HR 86 | Ht 73.0 in | Wt 199.0 lb

## 2023-05-08 DIAGNOSIS — M9904 Segmental and somatic dysfunction of sacral region: Secondary | ICD-10-CM

## 2023-05-08 DIAGNOSIS — H6590 Unspecified nonsuppurative otitis media, unspecified ear: Secondary | ICD-10-CM | POA: Insufficient documentation

## 2023-05-08 DIAGNOSIS — M5416 Radiculopathy, lumbar region: Secondary | ICD-10-CM | POA: Diagnosis not present

## 2023-05-08 DIAGNOSIS — M25531 Pain in right wrist: Secondary | ICD-10-CM | POA: Diagnosis not present

## 2023-05-08 DIAGNOSIS — M9908 Segmental and somatic dysfunction of rib cage: Secondary | ICD-10-CM | POA: Diagnosis not present

## 2023-05-08 DIAGNOSIS — M9901 Segmental and somatic dysfunction of cervical region: Secondary | ICD-10-CM | POA: Diagnosis not present

## 2023-05-08 DIAGNOSIS — M9902 Segmental and somatic dysfunction of thoracic region: Secondary | ICD-10-CM

## 2023-05-08 DIAGNOSIS — M9903 Segmental and somatic dysfunction of lumbar region: Secondary | ICD-10-CM | POA: Diagnosis not present

## 2023-05-08 DIAGNOSIS — H6591 Unspecified nonsuppurative otitis media, right ear: Secondary | ICD-10-CM | POA: Diagnosis not present

## 2023-05-08 MED ORDER — AMOXICILLIN-POT CLAVULANATE 875-125 MG PO TABS
1.0000 | ORAL_TABLET | Freq: Two times a day (BID) | ORAL | 0 refills | Status: AC
  Filled 2023-05-08: qty 20, 10d supply, fill #0

## 2023-05-08 MED ORDER — TIZANIDINE HCL 4 MG PO TABS
4.0000 mg | ORAL_TABLET | Freq: Every day | ORAL | 0 refills | Status: AC
  Filled 2023-05-08: qty 30, 30d supply, fill #0

## 2023-05-08 NOTE — Assessment & Plan Note (Signed)
Ear effusion noted, discussed Flonase to do twice daily over-the-counter and given Augmentin for any type of signs of sinusitis.  Increase activity as tolerated.  Follow-up with me again 6 to 8 weeks as ordered follow-up with ENT.

## 2023-05-08 NOTE — Assessment & Plan Note (Addendum)
No significant hypoechoic changes noted at this time.  We will continue to monitor.  Seems to be the [facility] joint.  Discussed putting pressure on it with weight lifting.

## 2023-05-08 NOTE — Assessment & Plan Note (Signed)
Still responding well to osteopathic manipulation and will be traveling.  Muscle relaxer given.

## 2023-05-08 NOTE — Patient Instructions (Addendum)
Zanaflex 4mg  at night prescribed Augmentin 875 2x a day 10 days prescribed See you again in 6-8 weeks

## 2023-05-10 ENCOUNTER — Ambulatory Visit: Payer: Commercial Managed Care - PPO | Admitting: Family Medicine

## 2023-05-20 ENCOUNTER — Encounter: Payer: Self-pay | Admitting: Family Medicine

## 2023-05-22 NOTE — Progress Notes (Unsigned)
Larry [name] [address] [place] [address] Phone: [phone] Subjective:   [name], am serving as a scribe for Dr. Antoine Primas.  I'm seeing this patient by the request  of:  Rice, Carollee Leitz, MD  CC: Back pain follow-up  [id number]  Larry [name] is a 62 y.o. male coming in with complaint of R back pain. Seen on 05/08/2023. Patient states back is still bothersome. R side back pain. Flares are intense constant. New setting on bed helps relieve pressure. Tylenol does help, muscle relaxer does not. Stretching helps as well. Discomfort in R knee a couple of day.        Past Medical History:  Diagnosis Date   High cholesterol    Hyperlipidemia    Hypertension    Past Surgical History:  Procedure Laterality Date   APPENDECTOMY     Social History   Socioeconomic History   Marital status: Married    Spouse name: Not on file   Number of children: Not on file   Years of education: Not on file   Highest education level: Not on file  Occupational History   Not on file  Tobacco Use   Smoking status: Never   Smokeless tobacco: Never  Substance and Sexual Activity   Alcohol use: Yes    Comment: 2 q wk, socially   Drug use: No   Sexual activity: Not on file  Other Topics Concern   Not on file  Social History Narrative   Not on file   Social Determinants of Health   Financial Resource Strain: Not on file  Food Insecurity: Not on file  Transportation Needs: Not on file  Physical Activity: Not on file  Stress: Not on file  Social Connections: Not on file   Allergies  Allergen Reactions   Amlodipine Swelling    Significant ankle edema   Family History  Problem Relation Age of Onset   Hyperlipidemia Mother    Hyperlipidemia Father      Current Outpatient Medications (Cardiovascular):    amLODipine (NORVASC) 5 MG tablet, Take 1 tablet by mouth once daily   atorvastatin (LIPITOR) 20 MG tablet, Take 1 tablet  (20 mg total) by mouth daily for cholesterol.   atorvastatin (LIPITOR) 20 MG tablet, Take 1 tablet (20 mg total) by mouth daily for cholesterol   candesartan-hydrochlorothiazide (ATACAND HCT) 32-12.5 MG tablet, Take 1 tablet by mouth daily.   hydrochlorothiazide (MICROZIDE) 12.5 MG capsule, Take 1 capsule (12.5 mg total) by mouth daily for blood pressure   losartan (COZAAR) 50 MG tablet, Take 1 tablet (50 mg total) by mouth 2 times daily.   rosuvastatin (CRESTOR) 20 MG tablet, Take 1 tablet by mouth daily for cholesterol   simvastatin (ZOCOR) 20 MG tablet, TAKE 1 TABLET BY MOUTH NIGHTLY   simvastatin (ZOCOR) 40 MG tablet, Take 1 tablet (40 mg total) by mouth daily for cholesterol  Current Outpatient Medications (Respiratory):    fluticasone (FLONASE) 50 MCG/ACT nasal spray, Use 2 sprays into both nostrils daily.  Current Outpatient Medications (Analgesics):    meloxicam (MOBIC) 15 MG tablet, Take 1 tablet (15 mg total) by mouth daily.   meloxicam (MOBIC) 15 MG tablet, Take 1 tablet (15 mg total) by mouth daily.   Current Outpatient Medications (Other):    ALPRAZolam (XANAX) 0.5 MG tablet, Take 1 tablet by mouth every 6 to 8 hours if needed for anxiety   amoxicillin-clavulanate (AUGMENTIN) 875-125 MG tablet, Take 1 tablet by  mouth 2 times daily.   COVID-19 mRNA bivalent vaccine, Pfizer, (PFIZER COVID-19 VAC BIVALENT) injection, Inject into the muscle.   gabapentin (NEURONTIN) 100 MG capsule, Take 2 capsules (200 mg total) by mouth at bedtime.   hydrOXYzine (ATARAX) 10 MG tablet, Take 1 tablet (10 mg total) by mouth 3 (three) times daily as needed.   polyethylene glycol-electrolytes (NULYTELY) 420 g solution, Use as directed   tiZANidine (ZANAFLEX) 4 MG tablet, Take 1 tablet (4 mg total) by mouth in the morning and at bedtime.   tiZANidine (ZANAFLEX) 4 MG tablet, Take 1 tablet (4 mg) by mouth at bedtime.   Reviewed prior external information including notes and imaging from  primary care  provider As well as notes that were available from care everywhere and other healthcare systems.  Past medical history, social, surgical and family history all reviewed in electronic medical record.  No pertanent information unless stated regarding to the chief complaint.   Review of Systems:  No headache, visual changes, nausea, vomiting, diarrhea, constipation, dizziness, abdominal pain, skin rash, fevers, chills, night sweats, weight loss, swollen lymph nodes, body aches, joint swelling, chest pain, shortness of breath, mood changes. POSITIVE muscle aches  Objective  Blood pressure (!) 138/96, pulse 92, height 6\' 1"  (1.854 m), weight 200 lb (90.7 kg), SpO2 96 %.   General: No apparent distress alert and oriented x3 mood and affect normal, dressed appropriately.  HEENT: Pupils equal, extraocular movements intact  Respiratory: Patient's speak in full sentences and does not appear short of breath  Cardiovascular: No lower extremity edema, non tender, no erythema  Low back does have some loss lordosis noted.  Positive straight leg test at 45 degrees of forward flexion.  No significant weakness noted.  Everything seems to be on the right side.  No tenderness in the paraspinal musculature on the right side.  Osteopathic findings  T9 extended rotated and side bent right L2 flexed rotated and side bent right Sacrum right on right     Impression and Recommendations:

## 2023-05-23 ENCOUNTER — Other Ambulatory Visit: Payer: Self-pay

## 2023-05-23 ENCOUNTER — Encounter: Payer: Self-pay | Admitting: Family Medicine

## 2023-05-23 ENCOUNTER — Ambulatory Visit: Payer: Commercial Managed Care - PPO | Admitting: Family Medicine

## 2023-05-23 VITALS — BP 138/96 | HR 92 | Ht 73.0 in | Wt 200.0 lb

## 2023-05-23 DIAGNOSIS — M25531 Pain in right wrist: Secondary | ICD-10-CM

## 2023-05-23 DIAGNOSIS — M9902 Segmental and somatic dysfunction of thoracic region: Secondary | ICD-10-CM | POA: Diagnosis not present

## 2023-05-23 DIAGNOSIS — M25561 Pain in right knee: Secondary | ICD-10-CM

## 2023-05-23 DIAGNOSIS — M9903 Segmental and somatic dysfunction of lumbar region: Secondary | ICD-10-CM

## 2023-05-23 DIAGNOSIS — M9904 Segmental and somatic dysfunction of sacral region: Secondary | ICD-10-CM

## 2023-05-23 DIAGNOSIS — M5416 Radiculopathy, lumbar region: Secondary | ICD-10-CM

## 2023-05-23 DIAGNOSIS — M999 Biomechanical lesion, unspecified: Secondary | ICD-10-CM | POA: Diagnosis not present

## 2023-05-23 MED ORDER — METHYLPREDNISOLONE ACETATE 80 MG/ML IJ SUSP
80.0000 mg | Freq: Once | INTRAMUSCULAR | Status: AC
  Administered 2023-05-23: 80 mg via INTRAMUSCULAR

## 2023-05-23 MED ORDER — KETOROLAC TROMETHAMINE 60 MG/2ML IM SOLN
60.0000 mg | Freq: Once | INTRAMUSCULAR | Status: AC
  Administered 2023-05-23: 60 mg via INTRAMUSCULAR

## 2023-05-23 NOTE — Assessment & Plan Note (Addendum)

## 2023-05-23 NOTE — Assessment & Plan Note (Signed)
Concern the patient is having more of the right sided back pain.  Seems to be worsening at the moment.  Some radicular symptoms.  Seems to be more in the L3 and even L2 area.  Toradol and Depo-Medrol given today.  Responding well to osteopathic manipulation.  Muscle relaxers encouraged at night.  Follow-up again in 2 to 3 weeks

## 2023-05-23 NOTE — Patient Instructions (Signed)
Appreciate you so much! Enjoy a little time off  2 injections today  Stay active  See me again as scheduled

## 2023-06-13 NOTE — Progress Notes (Signed)
  [name] [address] [place] [address] Phone: [phone] Subjective:   [name], am serving as a scribe for Dr. Antoine Primas.  I'm seeing this patient by the request  of:  Rice, Carollee Leitz, MD  CC: Back and neck pain follow-up  [id number]  Larry [name] is a 62 y.o. male coming in with complaint of back and neck pain. OMT on 05/23/2023. Patient states   Medications patient has been prescribed: Augmentin Zanaflex  Taking:         Reviewed prior external information including notes and imaging from previsou exam, outside providers and external EMR if available.   As well as notes that were available from care everywhere and other healthcare systems.  Past medical history, social, surgical and family history all reviewed in electronic medical record.  No pertanent information unless stated regarding to the chief complaint.   Past Medical History:  Diagnosis Date   High cholesterol    Hyperlipidemia    Hypertension     Allergies  Allergen Reactions   Amlodipine Swelling    Significant ankle edema     Objective  Blood pressure 118/82, pulse 89, height 6\' 1"  (1.854 m), weight 195 lb (88.5 kg), SpO2 95%.   General: No apparent distress alert and oriented x3 mood and affect normal, dressed appropriately.  HEENT: Pupils equal, extraocular movements intact  Respiratory: Patient's speak in full sentences and does not appear short of breath  Cardiovascular: No lower extremity edema, non tender, no erythema  Low back exam does have some very mild loss of lordosis.  Still has some tightness in the paraspinal musculature of the right side of the back.  No significant radicular symptoms at the moment.  Osteopathic findings  T7 extended rotated and side bent left L2 flexed rotated and side bent right L3 flexed rotated and side bent left Sacrum right on right       Assessment and Plan:  Right lumbar  radiculitis Right low back pain  Much better from the exacerbation patient is having previously.  Spine extremely well to osteopathic manipulation again.  Believe that patient's medication was helpful.  Increase activity slowly.  Follow-up again in 6 to 8 weeks otherwise.    Nonallopathic problems  Decision today to treat with OMT was based on Physical Exam  After verbal consent patient was treated with HVLA, ME, FPR techniques in, , thoracic, lumbar, and sacral  areas  Patient tolerated the procedure well with improvement in symptoms  Patient given exercises, stretches and lifestyle modifications  See medications in patient instructions if given  Patient will follow up in 4-8 weeks    The above documentation has been reviewed and is accurate and complete Judi Saa, DO          Note: This dictation was prepared with Dragon dictation along with smaller phrase technology. Any transcriptional errors that result from this process are unintentional.

## 2023-06-19 ENCOUNTER — Other Ambulatory Visit (HOSPITAL_COMMUNITY): Payer: Self-pay

## 2023-06-19 MED ORDER — ALPRAZOLAM 0.5 MG PO TABS
0.5000 mg | ORAL_TABLET | Freq: Four times a day (QID) | ORAL | 3 refills | Status: AC | PRN
  Filled 2023-06-19: qty 30, 8d supply, fill #0

## 2023-06-21 ENCOUNTER — Ambulatory Visit: Payer: Commercial Managed Care - PPO | Admitting: Family Medicine

## 2023-06-21 ENCOUNTER — Encounter: Payer: Self-pay | Admitting: Family Medicine

## 2023-06-21 VITALS — BP 118/82 | HR 89 | Ht 73.0 in | Wt 195.0 lb

## 2023-06-21 DIAGNOSIS — M9904 Segmental and somatic dysfunction of sacral region: Secondary | ICD-10-CM | POA: Diagnosis not present

## 2023-06-21 DIAGNOSIS — M5416 Radiculopathy, lumbar region: Secondary | ICD-10-CM

## 2023-06-21 DIAGNOSIS — M9902 Segmental and somatic dysfunction of thoracic region: Secondary | ICD-10-CM | POA: Diagnosis not present

## 2023-06-21 DIAGNOSIS — M9903 Segmental and somatic dysfunction of lumbar region: Secondary | ICD-10-CM

## 2023-06-21 NOTE — Assessment & Plan Note (Signed)
Right low back pain  Much better from the exacerbation patient is having previously.  Spine extremely well to osteopathic manipulation again.  Believe that patient's medication was helpful.  Increase activity slowly.  Follow-up again in 6 to 8 weeks otherwise.

## 2023-06-21 NOTE — Patient Instructions (Signed)
Good to see you! Glad you're doing so much better Go shoe shopping See you again in 2-3 months

## 2023-08-06 ENCOUNTER — Other Ambulatory Visit (HOSPITAL_BASED_OUTPATIENT_CLINIC_OR_DEPARTMENT_OTHER): Payer: Self-pay

## 2023-08-06 MED ORDER — COMIRNATY 30 MCG/0.3ML IM SUSY
0.3000 mL | PREFILLED_SYRINGE | Freq: Once | INTRAMUSCULAR | 0 refills | Status: AC
  Filled 2023-08-06: qty 0.3, 1d supply, fill #0

## 2023-08-06 MED ORDER — FLULAVAL 0.5 ML IM SUSY
0.5000 mL | PREFILLED_SYRINGE | Freq: Once | INTRAMUSCULAR | 0 refills | Status: AC
  Filled 2023-08-06: qty 0.5, 1d supply, fill #0

## 2023-08-07 ENCOUNTER — Other Ambulatory Visit (HOSPITAL_COMMUNITY): Payer: Self-pay

## 2023-08-07 DIAGNOSIS — I10 Essential (primary) hypertension: Secondary | ICD-10-CM | POA: Diagnosis not present

## 2023-08-07 DIAGNOSIS — N529 Male erectile dysfunction, unspecified: Secondary | ICD-10-CM | POA: Diagnosis not present

## 2023-08-07 DIAGNOSIS — M545 Low back pain, unspecified: Secondary | ICD-10-CM | POA: Diagnosis not present

## 2023-08-07 DIAGNOSIS — G8929 Other chronic pain: Secondary | ICD-10-CM | POA: Diagnosis not present

## 2023-08-07 DIAGNOSIS — F419 Anxiety disorder, unspecified: Secondary | ICD-10-CM | POA: Diagnosis not present

## 2023-08-07 DIAGNOSIS — Z0000 Encounter for general adult medical examination without abnormal findings: Secondary | ICD-10-CM | POA: Diagnosis not present

## 2023-08-07 DIAGNOSIS — E785 Hyperlipidemia, unspecified: Secondary | ICD-10-CM | POA: Diagnosis not present

## 2023-08-08 ENCOUNTER — Other Ambulatory Visit (HOSPITAL_COMMUNITY): Payer: Self-pay

## 2023-08-08 MED ORDER — LOSARTAN POTASSIUM 50 MG PO TABS
50.0000 mg | ORAL_TABLET | Freq: Two times a day (BID) | ORAL | 3 refills | Status: DC
  Filled 2023-08-08: qty 180, 90d supply, fill #0
  Filled 2023-11-04: qty 180, 90d supply, fill #1
  Filled 2024-01-31: qty 180, 90d supply, fill #2
  Filled 2024-04-27: qty 180, 90d supply, fill #3

## 2023-08-14 ENCOUNTER — Other Ambulatory Visit (HOSPITAL_COMMUNITY): Payer: Self-pay

## 2023-08-16 NOTE — Progress Notes (Unsigned)
  [name] [address] [place] [address] Phone: [phone] Subjective:   Larry [name], am serving as a scribe for Dr. Antoine Primas.  I'm seeing this patient by the request  of:  Rice, Carollee Leitz, MD  CC: Back and neck pain follow-up  [id number]  MARDARIUS DELBUONO is a 62 y.o. male coming in with complaint of back and neck pain. OMT on 06/21/2023. Patient states continue to have some mild discomfort but nothing that stopping him from activity.  Staying very active.  Medications patient has been prescribed:   Taking:         Reviewed prior external information including notes and imaging from previsou exam, outside providers and external EMR if available.   As well as notes that were available from care everywhere and other healthcare systems.  Past medical history, social, surgical and family history all reviewed in electronic medical record.  No pertanent information unless stated regarding to the chief complaint.   Past Medical History:  Diagnosis Date   High cholesterol    Hyperlipidemia    Hypertension     Allergies  Allergen Reactions   Amlodipine Swelling    Significant ankle edema     Review of Systems:  No headache, visual changes, nausea, vomiting, diarrhea, constipation, dizziness, abdominal pain, skin rash, fevers, chills, night sweats, weight loss, swollen lymph nodes, body aches, joint swelling, chest pain, shortness of breath, mood changes. POSITIVE muscle aches  Objective  Blood pressure 122/82, pulse 72, height 6\' 1"  (1.854 m), weight 198 lb (89.8 kg), SpO2 98%.   General: No apparent distress alert and oriented x3 mood and affect normal, dressed appropriately.  HEENT: Pupils equal, extraocular movements intact  Respiratory: Patient's speak in full sentences and does not appear short of breath  Cardiovascular: No lower extremity edema, non tender, no erythema  MSK:  Back does have some loss of  lordosis noted.  Some tightness noted in the paraspinal musculature on the right side.  Negative straight leg test noted and does still have more tightness on the right side lower of the paraspinal musculature of the lumbar spine  Osteopathic findings  T8 extended rotated and side bent left L1 flexed rotated and side bent right Sacrum right on right     Assessment and Plan:  Right lumbar radiculitis Overall doing significantly better.  Discussed with patient about continuing to work on the core strengthening but I do believe the patient is in a good area at the moment.  Follow-up again in 2 months has responded well to osteopathic manipulation.    Nonallopathic problems  Decision today to treat with OMT was based on Physical Exam  After verbal consent patient was treated with HVLA, ME, FPR techniques in thoracic, lumbar, and sacral  areas  Patient tolerated the procedure well with improvement in symptoms  Patient given exercises, stretches and lifestyle modifications  See medications in patient instructions if given  Patient will follow up in 4-8 weeks     The above documentation has been reviewed and is accurate and complete Judi Saa, DO         Note: This dictation was prepared with Dragon dictation along with smaller phrase technology. Any transcriptional errors that result from this process are unintentional.

## 2023-08-20 ENCOUNTER — Ambulatory Visit: Payer: Commercial Managed Care - PPO | Admitting: Family Medicine

## 2023-08-20 VITALS — BP 122/82 | HR 72 | Ht 73.0 in | Wt 198.0 lb

## 2023-08-20 DIAGNOSIS — M9903 Segmental and somatic dysfunction of lumbar region: Secondary | ICD-10-CM | POA: Diagnosis not present

## 2023-08-20 DIAGNOSIS — M5416 Radiculopathy, lumbar region: Secondary | ICD-10-CM | POA: Diagnosis not present

## 2023-08-20 DIAGNOSIS — M9904 Segmental and somatic dysfunction of sacral region: Secondary | ICD-10-CM | POA: Diagnosis not present

## 2023-08-20 DIAGNOSIS — M9902 Segmental and somatic dysfunction of thoracic region: Secondary | ICD-10-CM

## 2023-08-20 NOTE — Assessment & Plan Note (Signed)
Overall doing significantly better.  Discussed with patient about continuing to work on the core strengthening but I do believe the patient is in a good area at the moment.  Follow-up again in 2 months has responded well to osteopathic manipulation.

## 2023-08-20 NOTE — Patient Instructions (Signed)
See me again in 2 months

## 2023-08-21 ENCOUNTER — Encounter: Payer: Self-pay | Admitting: Family Medicine

## 2023-08-31 ENCOUNTER — Other Ambulatory Visit (HOSPITAL_COMMUNITY): Payer: Self-pay

## 2023-10-02 ENCOUNTER — Other Ambulatory Visit (HOSPITAL_COMMUNITY): Payer: Self-pay

## 2023-10-17 ENCOUNTER — Ambulatory Visit: Payer: Commercial Managed Care - PPO | Admitting: Family Medicine

## 2023-10-17 NOTE — Progress Notes (Signed)
[name] [address] [place] [address] Phone: [phone] Subjective:   Larry [name], am serving as a scribe for Dr. Antoine Primas.  I'm seeing this patient by the request  of:  Rice, Carollee Leitz, MD  CC: back pain, worsening left knee pain   [id number]  [name] is a 62 y.o. male coming in with complaint of back and neck pain. OMT on 08/20/2023. Patient states that his back is doing well.   L knee pain since last visit. Knee will get stiff and has some locking after being seated for a while. Painful to go up stairs.           Reviewed prior external information including notes and imaging from previsou exam, outside providers and external EMR if available.   As well as notes that were available from care everywhere and other healthcare systems.  Past medical history, social, surgical and family history all reviewed in electronic medical record.  No pertanent information unless stated regarding to the chief complaint.   Past Medical History:  Diagnosis Date   High cholesterol    Hyperlipidemia    Hypertension     Allergies  Allergen Reactions   Amlodipine Swelling    Significant ankle edema     Review of Systems:  No headache, visual changes, nausea, vomiting, diarrhea, constipation, dizziness, abdominal pain, skin rash, fevers, chills, night sweats, weight loss, swollen lymph nodes, body aches, joint swelling, chest pain, shortness of breath, mood changes. POSITIVE muscle aches  Objective  Blood pressure 118/80, pulse 75, height 6\' 1"  (1.854 m), weight 200 lb (90.7 kg), SpO2 99%.   General: No apparent distress alert and oriented x3 mood and affect normal, dressed appropriately.  HEENT: Pupils equal, extraocular movements intact  Respiratory: Patient's speak in full sentences and does not appear short of breath  Cardiovascular: No lower extremity edema, non tender, no erythema  Low back does have loss of  lordosis does have tightness SLT on right no radicular symptoms  Left knee pos Pat grind  Pos lat tracking, mild pain over medial joint line but more on the central patella    Limited muscular skeletal ultrasound was performed and interpreted by Antoine Primas, M  Limited US shows mild hypoechoic changes in the PF space, no loose body, no OA of the knee   Osteopathic findings  C2 flexed rotated and side bent right C5 flexed rotated and side bent left T3 extended rotated and side bent right inhaled rib T9 extended rotated and side bent left L2 flexed rotated and side bent right Sacrum right on right     Assessment and Plan:  Bilateral knee pain Left more then right, discussed HEP  Discussed which activities to do and which ones to avoid.  Increase activity as tolerated. Discussed biking and seat position.  RTC in 6-8 weeks   Right lumbar radiculitis Mild increase in tightness due to the knee likley and compensation, no radiation of the pain  Discussed hip abductor strengthening.  No med chanes 'RTC in 6-8 weeks     Nonallopathic problems  Decision today to treat with OMT was based on Physical Exam  After verbal consent patient was treated with HVLA, ME, FPR techniques in cervical, rib, thoracic, lumbar, and sacral  areas  Patient tolerated the procedure well with improvement in symptoms  Patient given exercises, stretches and lifestyle modifications  See medications in patient instructions if given  Patient will follow up in 4-8  weeks      The above documentation has been reviewed and is accurate and complete Judi Saa, DO        Note: This dictation was prepared with Dragon dictation along with smaller phrase technology. Any transcriptional errors that result from this process are unintentional.

## 2023-10-18 ENCOUNTER — Other Ambulatory Visit: Payer: Self-pay

## 2023-10-18 ENCOUNTER — Ambulatory Visit: Payer: Commercial Managed Care - PPO | Admitting: Family Medicine

## 2023-10-18 VITALS — BP 118/80 | HR 75 | Ht 73.0 in | Wt 200.0 lb

## 2023-10-18 DIAGNOSIS — M25562 Pain in left knee: Secondary | ICD-10-CM | POA: Diagnosis not present

## 2023-10-18 DIAGNOSIS — M9902 Segmental and somatic dysfunction of thoracic region: Secondary | ICD-10-CM

## 2023-10-18 DIAGNOSIS — M9903 Segmental and somatic dysfunction of lumbar region: Secondary | ICD-10-CM

## 2023-10-18 DIAGNOSIS — M9908 Segmental and somatic dysfunction of rib cage: Secondary | ICD-10-CM | POA: Diagnosis not present

## 2023-10-18 DIAGNOSIS — M25561 Pain in right knee: Secondary | ICD-10-CM | POA: Diagnosis not present

## 2023-10-18 DIAGNOSIS — M9904 Segmental and somatic dysfunction of sacral region: Secondary | ICD-10-CM | POA: Diagnosis not present

## 2023-10-18 DIAGNOSIS — M5416 Radiculopathy, lumbar region: Secondary | ICD-10-CM

## 2023-10-18 DIAGNOSIS — M9901 Segmental and somatic dysfunction of cervical region: Secondary | ICD-10-CM

## 2023-10-18 NOTE — Patient Instructions (Signed)
PF exercises Ice after activity Move bike seat up one level See me in 6-8 weeks

## 2023-10-19 ENCOUNTER — Encounter: Payer: Self-pay | Admitting: Family Medicine

## 2023-10-19 NOTE — Assessment & Plan Note (Signed)
Mild increase in tightness due to the knee likley and compensation, no radiation of the pain  Discussed hip abductor strengthening.  No med chanes 'RTC in 6-8 weeks

## 2023-10-19 NOTE — Assessment & Plan Note (Signed)
Left more then right, discussed HEP  Discussed which activities to do and which ones to avoid.  Increase activity as tolerated. Discussed biking and seat position.  RTC in 6-8 weeks

## 2023-11-07 ENCOUNTER — Other Ambulatory Visit (HOSPITAL_COMMUNITY): Payer: Self-pay

## 2023-11-09 DIAGNOSIS — R972 Elevated prostate specific antigen [PSA]: Secondary | ICD-10-CM | POA: Diagnosis not present

## 2023-11-25 DIAGNOSIS — J029 Acute pharyngitis, unspecified: Secondary | ICD-10-CM | POA: Diagnosis not present

## 2023-11-25 DIAGNOSIS — Z20822 Contact with and (suspected) exposure to covid-19: Secondary | ICD-10-CM | POA: Diagnosis not present

## 2023-12-05 ENCOUNTER — Other Ambulatory Visit (HOSPITAL_COMMUNITY): Payer: Self-pay

## 2023-12-14 NOTE — Progress Notes (Signed)
[name] [address] [place] [address] Phone: [phone] Subjective:   Larry [name], am serving as a scribe for Dr. Antoine Primas.   I'm seeing this patient by the request  of:  Rice, Carollee Leitz, MD  CC: Low back pain follow-up, knee pain  [id number]  Larry [name] is a 63 y.o. male coming in with complaint of back and neck pain. OMT on 10/18/2023. Also seen for knee pain. Patient states has been doing relatively well.  Has some tightness noted but nothing that is stopping her from activity.  Has started using elliptical.  Had lab at around 1          Reviewed prior external information including notes and imaging from previsou exam, outside providers and external EMR if available.   As well as notes that were available from care everywhere and other healthcare systems.  Past medical history, social, surgical and family history all reviewed in electronic medical record.  No pertanent information unless stated regarding to the chief complaint.   Past Medical History:  Diagnosis Date   High cholesterol    Hyperlipidemia    Hypertension     Allergies  Allergen Reactions   Amlodipine Swelling    Significant ankle edema     Review of Systems:  No headache, visual changes, nausea, vomiting, diarrhea, constipation, dizziness, abdominal pain, skin rash, fevers, chills, night sweats, weight loss, swollen lymph nodes, body aches, joint swelling, chest pain, shortness of breath, mood changes. POSITIVE muscle aches  Objective  Blood pressure 126/82, height 6\' 1"  (1.854 m).   General: No apparent distress alert and oriented x3 mood and affect normal, dressed appropriately.  HEENT: Pupils equal, extraocular movements intact  Respiratory: Patient's speak in full sentences and does not appear short of breath  Cardiovascular: No lower extremity edema, non tender, no erythema  MSK:  Back mild loss lordosis noted.  Tenderness  to palpation in the paraspinal musculature of the right side of the lumbar spine.  Negative straight leg test today but still has tightness with straight leg on the right compared to the left.  Osteopathic findings  C2 flexed rotated and side bent right C6 flexed rotated and side bent left T3 extended rotated and side bent right inhaled rib T9 extended rotated and side bent left L2 flexed rotated and side bent right Sacrum right on right     Assessment and Plan:  Right lumbar radiculitis On slowly patient is doing relatively well overall.  Discussed icing regimen and home exercises, discussed which activities to do and which ones to avoid.  Increase activity slowly.  Discussed icing regimen.  Follow-up again in 6 to 8 weeks and then M[place]zoon wheezes noted.  Patient did have some lower quadrant tenderness we did examine his abdomen today.  Treatments that he can do on his own.    Nonallopathic problems  Decision today to treat with OMT was based on Physical Exam  After verbal consent patient was treated with HVLA, ME, FPR techniques in cervical, rib, thoracic, lumbar, and sacral  areas  Patient tolerated the procedure well with improvement in symptoms  Patient given exercises, stretches and lifestyle modifications  See medications in patient instructions if given  Patient will follow up in 4-8 weeks     The above documentation has been reviewed and is accurate and complete Judi Saa, DO         Note: This dictation was prepared with Dragon dictation along  with smaller phrase technology. Any transcriptional errors that result from this process are unintentional.

## 2023-12-20 ENCOUNTER — Ambulatory Visit: Payer: Commercial Managed Care - PPO | Admitting: Family Medicine

## 2023-12-20 VITALS — BP 126/82 | Ht 73.0 in

## 2023-12-20 DIAGNOSIS — M5416 Radiculopathy, lumbar region: Secondary | ICD-10-CM | POA: Diagnosis not present

## 2023-12-20 DIAGNOSIS — M9902 Segmental and somatic dysfunction of thoracic region: Secondary | ICD-10-CM | POA: Diagnosis not present

## 2023-12-20 DIAGNOSIS — M9903 Segmental and somatic dysfunction of lumbar region: Secondary | ICD-10-CM | POA: Diagnosis not present

## 2023-12-20 DIAGNOSIS — M9904 Segmental and somatic dysfunction of sacral region: Secondary | ICD-10-CM | POA: Diagnosis not present

## 2023-12-20 DIAGNOSIS — M9901 Segmental and somatic dysfunction of cervical region: Secondary | ICD-10-CM

## 2023-12-20 DIAGNOSIS — M9908 Segmental and somatic dysfunction of rib cage: Secondary | ICD-10-CM

## 2023-12-20 NOTE — Patient Instructions (Addendum)
Good to see you  Remember the basketball trick  Follow up in 6-8 weeks

## 2023-12-22 ENCOUNTER — Encounter: Payer: Self-pay | Admitting: Family Medicine

## 2023-12-22 NOTE — Assessment & Plan Note (Signed)
On slowly patient is doing relatively well overall.  Discussed icing regimen and home exercises, discussed which activities to do and which ones to avoid.  Increase activity slowly.  Discussed icing regimen.  Follow-up again in 6 to 8 weeks and then M[place]zoon wheezes noted.  Patient did have some lower quadrant tenderness we did examine his abdomen today.  Treatments that he can do on his own.

## 2023-12-24 ENCOUNTER — Other Ambulatory Visit (HOSPITAL_COMMUNITY): Payer: Self-pay

## 2023-12-24 MED ORDER — ATORVASTATIN CALCIUM 20 MG PO TABS
20.0000 mg | ORAL_TABLET | Freq: Every day | ORAL | 3 refills | Status: DC
  Filled 2023-12-24: qty 90, 90d supply, fill #0
  Filled 2024-04-03: qty 90, 90d supply, fill #1
  Filled 2024-07-03: qty 90, 90d supply, fill #2
  Filled 2024-10-05: qty 90, 90d supply, fill #3

## 2024-01-11 ENCOUNTER — Encounter: Payer: Self-pay | Admitting: Family Medicine

## 2024-01-14 ENCOUNTER — Other Ambulatory Visit: Payer: Self-pay

## 2024-01-14 ENCOUNTER — Other Ambulatory Visit (HOSPITAL_COMMUNITY): Payer: Self-pay

## 2024-01-14 DIAGNOSIS — M25562 Pain in left knee: Secondary | ICD-10-CM

## 2024-01-14 MED ORDER — DIAZEPAM 5 MG PO TABS
ORAL_TABLET | ORAL | 0 refills | Status: AC
  Filled 2024-01-14: qty 2, 1d supply, fill #0

## 2024-01-15 ENCOUNTER — Encounter: Payer: Self-pay | Admitting: Family Medicine

## 2024-01-19 ENCOUNTER — Ambulatory Visit
Admission: RE | Admit: 2024-01-19 | Discharge: 2024-01-19 | Disposition: A | Discharge status: Home / Self Care | Payer: Commercial Managed Care - PPO | Source: Ambulatory Visit | Attending: Family Medicine | Admitting: Family Medicine

## 2024-01-19 DIAGNOSIS — G8929 Other chronic pain: Secondary | ICD-10-CM | POA: Diagnosis not present

## 2024-01-19 DIAGNOSIS — M25562 Pain in left knee: Secondary | ICD-10-CM | POA: Diagnosis not present

## 2024-01-24 ENCOUNTER — Encounter: Payer: Self-pay | Admitting: Family Medicine

## 2024-01-24 DIAGNOSIS — M25562 Pain in left knee: Secondary | ICD-10-CM

## 2024-01-30 DIAGNOSIS — M25562 Pain in left knee: Secondary | ICD-10-CM | POA: Diagnosis not present

## 2024-02-01 NOTE — Progress Notes (Signed)
 [name] [name] [place] [address] Phone: [phone] Subjective:   [name] am a scribe for Dr. Katrinka Blazing.   I'm seeing this patient by the request  of:  [name], Larry Leitz, MD  CC: Back and neck pain follow-up  [id number]  Larry [name] is a 63 y.o. male coming in with complaint of back and neck pain. OMT 12/20/2023. Patient states everything is good today.  Patient states that the knee is doing relatively well at the moment.  Nothing too severe.  Been wearing the brace and doing better.  Medications patient has been prescribed: Valium  Taking: No         Reviewed prior external information including notes and imaging from previsou exam, outside providers and external EMR if available.   As well as notes that were available from care everywhere and other healthcare systems.  Past medical history, social, surgical and family history all reviewed in electronic medical record.  No pertanent information unless stated regarding to the chief complaint.   Past Medical History:  Diagnosis Date   High cholesterol    Hyperlipidemia    Hypertension     Allergies  Allergen Reactions   Amlodipine Swelling    Significant ankle edema     Review of Systems:  No headache, visual changes, nausea, vomiting, diarrhea, constipation, dizziness, abdominal pain, skin rash, fevers, chills, night sweats, weight loss, swollen lymph nodes, body aches, joint swelling, chest pain, shortness of breath, mood changes. POSITIVE muscle aches  Objective  Blood pressure 126/80, pulse 77, height 6\' 1"  (1.854 m), weight 196 lb (88.9 kg), SpO2 98%.   General: No apparent distress alert and oriented x3 mood and affect normal, dressed appropriately.  HEENT: Pupils equal, extraocular movements intact  Respiratory: Patient's speak in full sentences and does not appear short of breath  Cardiovascular: No lower extremity edema, non tender, no  erythema  Gait MSK:  Back does have some loss lordosis noted.  Some tenderness to palpation in the paraspinal musculature.  Some tightness noted with certain range of motion.  Osteopathic findings  C2 flexed rotated and side bent right C6 flexed rotated and side bent left T3 extended rotated and side bent right inhaled rib T9 extended rotated and side bent left L2 flexed rotated and side bent right Sacrum right on right       Assessment and Plan:  Right lumbar radiculitis Patient does have some tightness of the neck as well as the lower back.  Patient so I do think is going to do well with the knee at the moment.  We discussed the patellofemoral strap as well as the Tru pull lite for certain other activities.  Discussed worsening symptoms injections could be beneficial but patient does have a reasonable phobia of needles.  Will continue to monitor.  Follow-up again in 6 to 8 weeks    Nonallopathic problems  Decision today to treat with OMT was based on Physical Exam  After verbal consent patient was treated with HVLA, ME, FPR techniques in cervical, rib, thoracic, lumbar, and sacral  areas  Patient tolerated the procedure well with improvement in symptoms  Patient given exercises, stretches and lifestyle modifications  See medications in patient instructions if given  Patient will follow up in 4-8 weeks     The above documentation has been reviewed and is accurate and complete Larry Saa, DO         Note: This dictation was prepared  with Dragon dictation along with smaller phrase technology. Any transcriptional errors that result from this process are unintentional.

## 2024-02-04 ENCOUNTER — Ambulatory Visit: Admitting: Family Medicine

## 2024-02-04 ENCOUNTER — Encounter: Payer: Self-pay | Admitting: Family Medicine

## 2024-02-04 VITALS — BP 126/80 | HR 77 | Ht 73.0 in | Wt 196.0 lb

## 2024-02-04 DIAGNOSIS — M9908 Segmental and somatic dysfunction of rib cage: Secondary | ICD-10-CM

## 2024-02-04 DIAGNOSIS — M9903 Segmental and somatic dysfunction of lumbar region: Secondary | ICD-10-CM | POA: Diagnosis not present

## 2024-02-04 DIAGNOSIS — M9904 Segmental and somatic dysfunction of sacral region: Secondary | ICD-10-CM | POA: Diagnosis not present

## 2024-02-04 DIAGNOSIS — M9902 Segmental and somatic dysfunction of thoracic region: Secondary | ICD-10-CM

## 2024-02-04 DIAGNOSIS — M9901 Segmental and somatic dysfunction of cervical region: Secondary | ICD-10-CM | POA: Diagnosis not present

## 2024-02-04 DIAGNOSIS — M5416 Radiculopathy, lumbar region: Secondary | ICD-10-CM

## 2024-02-04 NOTE — Patient Instructions (Addendum)
 Good to see you. Can try the patellar strap. Use the brace when working out.  Return in 6 to 8 weeks.

## 2024-02-04 NOTE — Assessment & Plan Note (Signed)
 Patient does have some tightness of the neck as well as the lower back.  Patient so I do think is going to do well with the knee at the moment.  We discussed the patellofemoral strap as well as the Tru pull lite for certain other activities.  Discussed worsening symptoms injections could be beneficial but patient does have a reasonable phobia of needles.  Will continue to monitor.  Follow-up again in 6 to 8 weeks

## 2024-02-05 ENCOUNTER — Other Ambulatory Visit (HOSPITAL_COMMUNITY): Payer: Self-pay

## 2024-02-07 ENCOUNTER — Ambulatory Visit: Payer: Commercial Managed Care - PPO | Admitting: Family Medicine

## 2024-02-21 ENCOUNTER — Other Ambulatory Visit (HOSPITAL_COMMUNITY): Payer: Self-pay

## 2024-02-21 MED ORDER — SILDENAFIL CITRATE 20 MG PO TABS
ORAL_TABLET | ORAL | 3 refills | Status: AC
  Filled 2024-02-21: qty 50, 30d supply, fill #0

## 2024-02-22 ENCOUNTER — Other Ambulatory Visit (HOSPITAL_COMMUNITY): Payer: Self-pay

## 2024-02-27 ENCOUNTER — Encounter (HOSPITAL_BASED_OUTPATIENT_CLINIC_OR_DEPARTMENT_OTHER): Payer: Self-pay | Admitting: Physical Therapy

## 2024-02-27 ENCOUNTER — Ambulatory Visit (HOSPITAL_BASED_OUTPATIENT_CLINIC_OR_DEPARTMENT_OTHER): Attending: Family Medicine | Admitting: Physical Therapy

## 2024-02-27 ENCOUNTER — Other Ambulatory Visit: Payer: Self-pay

## 2024-02-27 DIAGNOSIS — M25562 Pain in left knee: Secondary | ICD-10-CM | POA: Diagnosis not present

## 2024-02-27 DIAGNOSIS — M6281 Muscle weakness (generalized): Secondary | ICD-10-CM | POA: Diagnosis not present

## 2024-02-27 NOTE — Therapy (Signed)
 OUTPATIENT PHYSICAL THERAPY LOWER EXTREMITY EVALUATION   Patient Name: KOHL POLINSKY MRN: [id number] DOB:[birth year], 63 y.o., male Today's Date: 02/28/2024  END OF SESSION:  PT End of Session - 02/27/24 1640     Visit Number 1    Number of Visits 8    Date for PT Re-Evaluation 04/09/24    Authorization Type Gretta Began    PT Start Time 1616    PT Stop Time 1716    PT Time Calculation (min) 60 min    Activity Tolerance Patient tolerated treatment well    Behavior During Therapy [facility] for tasks assessed/performed             Past Medical History:  Diagnosis Date   High cholesterol    Hyperlipidemia    Hypertension    Past Surgical History:  Procedure Laterality Date   APPENDECTOMY     Patient Active Problem List   Diagnosis Date Noted   Middle ear effusion 05/08/2023   Bilateral knee pain 03/22/2023   High ankle sprain, right, initial encounter 01/31/2023   Right wrist pain 11/14/2022   Left elbow contusion 09/21/2022   Acute upper respiratory infection 11/03/2021   Left rotator cuff tear 05/11/2020   Nonallopathic lesion of thoracic region 07/09/2018   Nonallopathic lesion of cervical region 07/09/2018   Nonallopathic lesion of lumbar region 07/09/2018   Nonallopathic lesion of sacral region 07/09/2018   Right rotator cuff tendinitis 04/12/2017   Right lumbar radiculitis 09/29/2014   Foot pain, left 09/08/2014   HYPERLIPIDEMIA 10/09/2010     REFERRING PROVIDER: Judi Saa, DO  REFERRING DIAG: [phone] (ICD-10-CM) - Left knee pain, unspecified chronicity  THERAPY DIAG:  Left knee pain, unspecified chronicity  Muscle weakness (generalized)  Rationale for Evaluation and Treatment: Rehabilitation  ONSET DATE: Fall 2024  SUBJECTIVE:   SUBJECTIVE STATEMENT: He goes by Virl Diamond". His pain began last fall with no specific injury.  He reports having pain in bilat knees, though much worse on left.  Pt states his father had RA.  Pt was tested  which was negative.  Pt saw MD on 10/18/23 and note indicated possible lat tracking and L > R knee pain.  Pt had a MRI on 01/19/2024.  MD message indicated his patella tracking inappropriately.  MD suggested bracing and ordered PT.  Pt is using the brace when he works out.  He also has a patellofemoral strap though hasn't needed to use it.    Pt states his pain comes and goes and lasts for different times.  Pt states the pain can last days at times and weeks at other times, but the pain goes away.  He has pain when he is still for a prolonged time including driving, sleeping, and flying.   Pt hasn't had pain in weeks.  When he has his worst pain, he's unable to sleep.  When he has the pain, he has pain with ascending > descending stairs.  He also has pain with and has to be careful with initiating walking after sitting awhile.  Pt has deficits with walking including decreased speed when he is having pain.     Pt uses the elliptical bike and performs upper body strengthening.  His knee feels better after using the elliptical bike.   PERTINENT HISTORY: R knee pain, though L knee is worse HTN Hx of R Lumbar radiculitis, Pt reports having 3 herniated discs.  Pt receives regular adjustments.  PAIN:  NPRS:  0/10 current and best, 7-8/10 worst  Location: superior L knee, distal quad  PRECAUTIONS: None   WEIGHT BEARING RESTRICTIONS: No  FALLS:  Has patient fallen in last 6 months? 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  DIAGNOSTIC FINDINGS: MRI on 01/19/24: IMPRESSION: 1. Negative for meniscal or ligament tear. 2. Patellofemoral osteoarthritis which is most notable at the apex of the patella where cartilage is denuded and  there is fluid undermining a small flap of cartilage on the lateral patellar facet. 3. 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  PATIENT SURVEYS:  LEFS 69/80  COGNITION: Overall cognitive status: Within functional limits for tasks assessed      MUSCLE LENGTH: Tightness in bilat HS   PALPATION: Pt had no tenderness to palpation t/o L knee   LOWER EXTREMITY ROM:  Active ROM Right eval Left eval  Hip flexion    Hip extension    Hip abduction [facility]  Hip adduction [facility]  Hip internal rotation  WFL  Hip external rotation 34 29  Knee flexion 131 142  Knee extension 0 0  Ankle dorsiflexion    Ankle plantarflexion    Ankle inversion    Ankle eversion     (Blank rows = not tested)  LOWER EXTREMITY MMT:  MMT Right eval Left eval  Hip flexion 5/5 5/5  Hip extension 5/5 5/5  Hip abduction 5/5 5/5  Hip adduction    Hip internal rotation    Hip external rotation 5/5 4+/5  Knee flexion 5/5 5/5  Knee extension 5/5 5/5  Ankle dorsiflexion    Ankle plantarflexion    Ankle inversion    Ankle eversion     (Blank rows = not tested)    FUNCTIONAL TESTS:  Pt has good form with squats with knees having slight anterior translation but not past the toes.   GAIT: Comments: Pt ambulates with a normalized heel to toe gait pattern without limping.  He has equal stance in bilat LE's.                                                                                                                                 TREATMENT:    Pt performed supine SLR, S/L hip abd, supine SLR with ER x 10 reps each.  Pt received a HEP handout and was educated in correct form and appropriate frequency.  PT instructed pt he should not have knee or back pain with exercises.    PATIENT EDUCATION:  Education details: dx, relevant anatomy, rationale of interventions, HEP, objective findings, and POC.  PT answered pt's questions. Person educated: Patient Education method:  Explanation, Demonstration, Tactile cues, Verbal cues, and Handouts Education comprehension: verbalized understanding,  returned demonstration, verbal cues required, and tactile cues required  HOME EXERCISE PROGRAM: Access Code: [email or web address] URL: [email or web address] Date: 02/27/2024 Prepared by: Aaron Edelman  Exercises - Supine Active Straight Leg Raise  - 1 x daily - 6-7 x weekly - 2 sets - 10 reps - Straight Leg Raise with External Rotation  - 1 x daily - 5-7 x weekly - 2 sets - 10 reps - Sidelying Hip Abduction  - 1 x daily - 6-7 x weekly - 2 sets - 10 reps  ASSESSMENT:  CLINICAL IMPRESSION: Patient is a 63 y.o. male with a dx of L knee pain.  Pt reports having bilat knee pain though L knee is worse.  Pt has intermittent pain and reports he hasn't had L knee pain in weeks.  When he has his worst pain, he's unable to sleep.  When his L knee is bothering him, he has pain with ascending > descending stairs, sitting, and initiating walking after prolonged sitting.  Pt also has deficits with walking including decreased speed when he is having pain.  Pt demonstrated good form with squatting.  He has good knee ROM bilat.  Pt had minimal weakness in L hip ER.  Pt should benefit from skilled PT to address impairments and improve overall function.         OBJECTIVE IMPAIRMENTS: decreased mobility, decreased strength, impaired flexibility, and pain.   ACTIVITY LIMITATIONS: sitting, sleeping, stairs, and locomotion level  PARTICIPATION LIMITATIONS: driving  PERSONAL FACTORS: 1-2 comorbidities: R knee pain and lumbar radiculitis  are also affecting patient's functional outcome.   REHAB POTENTIAL: Good  CLINICAL DECISION MAKING: Stable/uncomplicated  EVALUATION COMPLEXITY: Low   GOALS:   SHORT TERM GOALS: Target date: 03/19/2024  Pt will be independent and compliant with HEP for improved pain, strength, and function.  Baseline: Goal status: INITIAL  2.  Pt will tolerate  progression of exercises without adverse effects for improved hip and knee strength and stability to reduce occurrences of pain, improve functional tolerance, and to decrease the intensity of pain.  Baseline:  Goal status: INITIAL   LONG TERM GOALS: Target date: 04/09/2024  If pt has pain, he will report at least a 60% improvement in sleeping. Baseline:  Goal status: INITIAL  2.  Pt will score 0-1 on the lateral step test for improved quad eccentric control and improved performance of stairs.  Baseline:  Goal status: INITIAL  3.  Pt will demo improved L hip ER strength to 5/5 MMT for improved knee support and tolerance with daily activities and mobility.  Baseline:  Goal status: INITIAL  4.  If or when he has pain, he will report at least a 70% reduction in the intensity of pain.  Baseline:  Goal status: INITIAL     PLAN:  PT FREQUENCY: 1-2x/week.  Plan to start out 1x/wk  PT DURATION: 6 weeks  PLANNED INTERVENTIONS: 97164- PT Re-evaluation, 97110-Therapeutic exercises, 97530- Therapeutic activity, [id number]- Neuromuscular re-education, 97535- Self Care, 09811- Manual therapy, [phone]- Gait training, [phone]- Aquatic Therapy, [phone]- Electrical stimulation (unattended), [phone]- Electrical stimulation (manual), 97016- Vasopneumatic device, 97035- Ultrasound, Patient/Family education, Balance training, Stair training, Taping, Dry Needling, Joint mobilization, Spinal mobilization, Cryotherapy, and Moist heat  PLAN FOR NEXT SESSION: Cont with quad and hip strengthening.  HS flexibility.  Review and perform HEP.  Update HEP per pt tolerance.   Audie Clear III PT, DPT 02/28/24 9:59 PM

## 2024-03-03 ENCOUNTER — Other Ambulatory Visit (HOSPITAL_COMMUNITY): Payer: Self-pay

## 2024-03-03 MED ORDER — HYDROCHLOROTHIAZIDE 12.5 MG PO CAPS
ORAL_CAPSULE | ORAL | 3 refills | Status: AC
  Filled 2024-03-03: qty 90, 90d supply, fill #0
  Filled 2024-06-15: qty 90, 90d supply, fill #1
  Filled 2024-10-05: qty 90, 90d supply, fill #2
  Filled 2024-12-29: qty 90, 90d supply, fill #3

## 2024-03-10 NOTE — Progress Notes (Deleted)
  [facility] [address] [place] [address] Phone: [phone] Subjective:    I'm seeing this patient by the request  of:  Rodell Citrin Parley Bolls, MD  CC:   [id number]  Larry [name] is a 63 y.o. male coming in with complaint of back and neck pain. OMT 02/04/2024. Patient states   Medications patient has been prescribed: None  Taking:         Reviewed prior external information including notes and imaging from previsou exam, outside providers and external EMR if available.   As well as notes that were available from care everywhere and other healthcare systems.  Past medical history, social, surgical and family history all reviewed in electronic medical record.  No pertanent information unless stated regarding to the chief complaint.   Past Medical History:  Diagnosis Date   High cholesterol    Hyperlipidemia    Hypertension     Allergies  Allergen Reactions   Amlodipine Swelling    Significant ankle edema     Review of Systems:  No headache, visual changes, nausea, vomiting, diarrhea, constipation, dizziness, abdominal pain, skin rash, fevers, chills, night sweats, weight loss, swollen lymph nodes, body aches, joint swelling, chest pain, shortness of breath, mood changes. POSITIVE muscle aches  Objective  There were no vitals taken for this visit.   General: No apparent distress alert and oriented x3 mood and affect normal, dressed appropriately.  HEENT: Pupils equal, extraocular movements intact  Respiratory: Patient's speak in full sentences and does not appear short of breath  Cardiovascular: No lower extremity edema, non tender, no erythema  Gait MSK:  Back   Osteopathic findings  C2 flexed rotated and side bent right C6 flexed rotated and side bent left T3 extended rotated and side bent right inhaled rib T9 extended rotated and side bent left L2 flexed rotated and side bent right Sacrum right on  right       Assessment and Plan:  No problem-specific Assessment & Plan notes found for this encounter.    Nonallopathic problems  Decision today to treat with OMT was based on Physical Exam  After verbal consent patient was treated with HVLA, ME, FPR techniques in cervical, rib, thoracic, lumbar, and sacral  areas  Patient tolerated the procedure well with improvement in symptoms  Patient given exercises, stretches and lifestyle modifications  See medications in patient instructions if given  Patient will follow up in 4-8 weeks             Note: This dictation was prepared with Dragon dictation along with smaller phrase technology. Any transcriptional errors that result from this process are unintentional.

## 2024-03-17 ENCOUNTER — Encounter (HOSPITAL_BASED_OUTPATIENT_CLINIC_OR_DEPARTMENT_OTHER): Payer: Self-pay | Admitting: Physical Therapy

## 2024-03-17 ENCOUNTER — Ambulatory Visit (HOSPITAL_BASED_OUTPATIENT_CLINIC_OR_DEPARTMENT_OTHER): Admitting: Physical Therapy

## 2024-03-17 DIAGNOSIS — M6281 Muscle weakness (generalized): Secondary | ICD-10-CM | POA: Diagnosis not present

## 2024-03-17 DIAGNOSIS — M25562 Pain in left knee: Secondary | ICD-10-CM | POA: Diagnosis not present

## 2024-03-17 NOTE — Therapy (Signed)
 OUTPATIENT PHYSICAL THERAPY LOWER EXTREMITY TREATMENT   Patient Name: MIKKO LEWELLEN MRN: [id number] DOB:February 18, 1961, 63 y.o., male Today's Date: 03/17/2024  END OF SESSION:  PT End of Session - 03/17/24 1606     Visit Number 2    Number of Visits 8    Date for PT Re-Evaluation 04/09/24    Authorization Type [employer] [employer]    PT Start Time 1606    PT Stop Time 1650    PT Time Calculation (min) 44 min    Activity Tolerance Patient tolerated treatment well    Behavior During Therapy [facility] for tasks assessed/performed              Past Medical History:  Diagnosis Date   High cholesterol    Hyperlipidemia    Hypertension    Past Surgical History:  Procedure Laterality Date   APPENDECTOMY     Patient Active Problem List   Diagnosis Date Noted   Middle ear effusion 05/08/2023   Bilateral knee pain 03/22/2023   High ankle sprain, right, initial encounter 01/31/2023   Right wrist pain 11/14/2022   Left elbow contusion 09/21/2022   Acute upper respiratory infection 11/03/2021   Left rotator cuff tear 05/11/2020   Nonallopathic lesion of thoracic region 07/09/2018   Nonallopathic lesion of cervical region 07/09/2018   Nonallopathic lesion of lumbar region 07/09/2018   Nonallopathic lesion of sacral region 07/09/2018   Right rotator cuff tendinitis 04/12/2017   Right lumbar radiculitis 09/29/2014   Foot pain, left 09/08/2014   HYPERLIPIDEMIA 10/09/2010     REFERRING PROVIDER: Isidro Margo, DO  REFERRING DIAG: [phone] (ICD-10-CM) - Left knee pain, unspecified chronicity  THERAPY DIAG:  Left knee pain, unspecified chronicity  Muscle weakness (generalized)  Rationale for Evaluation and Treatment: Rehabilitation  ONSET DATE: Fall 2024  SUBJECTIVE:   SUBJECTIVE STATEMENT: He goes by [name]".  Open to more lower body strengthening.     EVAL: His pain began last fall with no specific injury.  He reports having pain in bilat knees, though much  worse on left.  Pt states his father had RA.  Pt was tested which was negative.  Pt saw MD on 10/18/23 and note indicated possible lat tracking and L > R knee pain.  Pt had a MRI on 01/19/2024.  MD message indicated his patella tracking inappropriately.  MD suggested bracing and ordered PT.  Pt is using the brace when he works out.  He also has a patellofemoral strap though hasn't needed to use it.    Pt states his pain comes and goes and lasts for different times.  Pt states the pain can last days at times and weeks at other times, but the pain goes away.  He has pain when he is still for a prolonged time including driving, sleeping, and flying.   Pt hasn't had pain in weeks.  When he has his worst pain, he's unable to sleep.  When he has the pain, he has pain with ascending > descending stairs.  He also has pain with and has to be careful with initiating walking after sitting awhile.  Pt has deficits with walking including decreased speed when he is having pain.     Pt uses the elliptical bike and performs upper body strengthening.  His knee feels better after using the elliptical bike.   PERTINENT HISTORY: R knee pain, though L knee is worse HTN Hx of R Lumbar radiculitis, Pt reports having 3 herniated discs.  Pt receives  regular adjustments.  PAIN:  NPRS:  0/10 current and best, 7-8/10 worst Location: superior L knee, distal quad  PRECAUTIONS: None   WEIGHT BEARING RESTRICTIONS: No  FALLS:  Has patient fallen in last 6 months? 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  DIAGNOSTIC FINDINGS: MRI on 01/19/24: IMPRESSION: 1. Negative for meniscal or ligament tear. 2. Patellofemoral osteoarthritis which is most  notable at the apex of the patella where cartilage is denuded and there is fluid undermining a small flap of cartilage on the lateral patellar facet. 3. 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  PATIENT SURVEYS:  LEFS 69/80  COGNITION: Overall cognitive status: Within functional limits for tasks assessed      MUSCLE LENGTH: Tightness in bilat HS   PALPATION: Pt had no tenderness to palpation t/o L knee   LOWER EXTREMITY ROM:  Active ROM Right eval Left eval  Hip flexion    Hip extension    Hip abduction [facility]  Hip adduction [facility]  Hip internal rotation  WFL  Hip external rotation 34 29  Knee flexion 131 142  Knee extension 0 0  Ankle dorsiflexion    Ankle plantarflexion    Ankle inversion    Ankle eversion     (Blank rows = not tested)  LOWER EXTREMITY MMT:  MMT Right eval Left eval  Hip flexion 5/5 5/5  Hip extension 5/5 5/5  Hip abduction 5/5 5/5  Hip adduction    Hip internal rotation    Hip external rotation 5/5 4+/5  Knee flexion 5/5 5/5  Knee extension 5/5 5/5  Ankle dorsiflexion    Ankle plantarflexion    Ankle inversion    Ankle eversion     (Blank rows = not tested)    FUNCTIONAL TESTS:  Pt has good form with squats with knees having slight anterior translation but not past the toes.   GAIT: Comments: Pt ambulates with a normalized heel to toe gait pattern without limping.  He has equal stance in bilat LE's.                                                                                                                                 TREATMENT:    03/17/24: STM Rt lateral hip Supine HSS & piriformis stretch- also in seated Sit<>stand Dead lift- staggered stance for form Wall squat- static & with red swiss ball Lunge with pull off bar Discussed possible use of arch supports in dress shoes  Eval:  Pt performed supine SLR, S/L hip abd, supine SLR with ER x 10 reps each.  Pt received a HEP handout and was  educated in correct form  and appropriate frequency.  PT instructed pt he should not have knee or back pain with exercises.    PATIENT EDUCATION:  Education details: dx, relevant anatomy, rationale of interventions, HEP, objective findings, and POC.  PT answered pt's questions. Person educated: Patient Education method: Explanation, Demonstration, Tactile cues, Verbal cues, and Handouts Education comprehension: verbalized understanding, returned demonstration, verbal cues required, and tactile cues required  HOME EXERCISE PROGRAM: Access Code: [email or web address] URL: [email or web address] Date: 02/27/2024 Prepared by: Marnie Siren  Exercises - Supine Active Straight Leg Raise  - 1 x daily - 6-7 x weekly - 2 sets - 10 reps - Straight Leg Raise with External Rotation  - 1 x daily - 5-7 x weekly - 2 sets - 10 reps - Sidelying Hip Abduction  - 1 x daily - 6-7 x weekly - 2 sets - 10 reps  ASSESSMENT:  CLINICAL IMPRESSION: Heavy cuing required for proper form today- noted knee pain when knees progressed past ankles in squats and lunges. Pt verbalized preference to machinery rather than body weight exercises.    OBJECTIVE IMPAIRMENTS: decreased mobility, decreased strength, impaired flexibility, and pain.   ACTIVITY LIMITATIONS: sitting, sleeping, stairs, and locomotion level  PARTICIPATION LIMITATIONS: driving  PERSONAL FACTORS: 1-2 comorbidities: R knee pain and lumbar radiculitis  are also affecting patient's functional outcome.   REHAB POTENTIAL: Good  CLINICAL DECISION MAKING: Stable/uncomplicated  EVALUATION COMPLEXITY: Low   GOALS:   SHORT TERM GOALS: Target date: 03/19/2024  Pt will be independent and compliant with HEP for improved pain, strength, and function.  Baseline: Goal status: INITIAL  2.  Pt will tolerate progression of exercises without adverse effects for improved hip and knee strength and stability to reduce occurrences of pain, improve functional  tolerance, and to decrease the intensity of pain.  Baseline:  Goal status: INITIAL   LONG TERM GOALS: Target date: 04/09/2024  If pt has pain, he will report at least a 60% improvement in sleeping. Baseline:  Goal status: INITIAL  2.  Pt will score 0-1 on the lateral step test for improved quad eccentric control and improved performance of stairs.  Baseline:  Goal status: INITIAL  3.  Pt will demo improved L hip ER strength to 5/5 MMT for improved knee support and tolerance with daily activities and mobility.  Baseline:  Goal status: INITIAL  4.  If or when he has pain, he will report at least a 70% reduction in the intensity of pain.  Baseline:  Goal status: INITIAL     PLAN:  PT FREQUENCY: 1-2x/week.  Plan to start out 1x/wk  PT DURATION: 6 weeks  PLANNED INTERVENTIONS: 97164- PT Re-evaluation, 97110-Therapeutic exercises, 97530- Therapeutic activity, [id number]- Neuromuscular re-education, 97535- Self Care, 65784- Manual therapy, [phone]- Gait training, [phone]- Aquatic Therapy, [phone]- Electrical stimulation (unattended), [phone]- Electrical stimulation (manual), 97016- Vasopneumatic device, 97035- Ultrasound, Patient/Family education, Balance training, Stair training, Taping, Dry Needling, Joint mobilization, Spinal mobilization, Cryotherapy, and Moist heat  PLAN FOR NEXT SESSION: Cont with quad and hip strengthening.  HS flexibility.  Review and perform HEP.  Update HEP per pt tolerance.   Lakiah Dhingra C. Glorianne Proctor PT, DPT 03/17/24 5:05 PM

## 2024-03-18 ENCOUNTER — Ambulatory Visit: Admitting: Family Medicine

## 2024-03-24 ENCOUNTER — Encounter (HOSPITAL_BASED_OUTPATIENT_CLINIC_OR_DEPARTMENT_OTHER): Payer: Self-pay | Admitting: Physical Therapy

## 2024-03-24 ENCOUNTER — Ambulatory Visit (HOSPITAL_BASED_OUTPATIENT_CLINIC_OR_DEPARTMENT_OTHER): Admitting: Physical Therapy

## 2024-03-24 DIAGNOSIS — M25562 Pain in left knee: Secondary | ICD-10-CM

## 2024-03-24 DIAGNOSIS — M6281 Muscle weakness (generalized): Secondary | ICD-10-CM

## 2024-03-24 NOTE — Therapy (Signed)
 OUTPATIENT PHYSICAL THERAPY LOWER EXTREMITY TREATMENT   Patient Name: Larry [name] MRN: [id number] DOB:1961-04-10, 63 y.o., male Today's Date: 03/24/2024  END OF SESSION:  PT End of Session - 03/24/24 1614     Visit Number 3    Number of Visits 8    Date for PT Re-Evaluation 04/09/24    Authorization Type [employer]es [employer]    PT Start Time 1614    PT Stop Time 1640    PT Time Calculation (min) 26 min    Activity Tolerance Patient tolerated treatment well    Behavior During Therapy [facility] for tasks assessed/performed               Past Medical History:  Diagnosis Date   High cholesterol    Hyperlipidemia    Hypertension    Past Surgical History:  Procedure Laterality Date   APPENDECTOMY     Patient Active Problem List   Diagnosis Date Noted   Middle ear effusion 05/08/2023   Bilateral knee pain 03/22/2023   High ankle sprain, right, initial encounter 01/31/2023   Right wrist pain 11/14/2022   Left elbow contusion 09/21/2022   Acute upper respiratory infection 11/03/2021   Left rotator cuff tear 05/11/2020   Nonallopathic lesion of thoracic region 07/09/2018   Nonallopathic lesion of cervical region 07/09/2018   Nonallopathic lesion of lumbar region 07/09/2018   Nonallopathic lesion of sacral region 07/09/2018   Right rotator cuff tendinitis 04/12/2017   Right lumbar radiculitis 09/29/2014   Foot pain, left 09/08/2014   HYPERLIPIDEMIA 10/09/2010     REFERRING PROVIDER: Isidro Margo, DO  REFERRING DIAG: [phone] (ICD-10-CM) - Left knee pain, unspecified chronicity  THERAPY DIAG:  Left knee pain, unspecified chronicity  Muscle weakness (generalized)  Rationale for Evaluation and Treatment: Rehabilitation  ONSET DATE: Fall 2024  SUBJECTIVE:   SUBJECTIVE STATEMENT: He goes by Larry [name]".  Denied knee pain. Orthotics for a week and have made a definite difference.     EVAL: His pain began last fall with no specific injury.  He reports  having pain in bilat knees, though much worse on left.  Pt states his father had RA.  Pt was tested which was negative.  Pt saw MD on 10/18/23 and note indicated possible lat tracking and L > R knee pain.  Pt had a MRI on 01/19/2024.  MD message indicated his patella tracking inappropriately.  MD suggested bracing and ordered PT.  Pt is using the brace when he works out.  He also has a patellofemoral strap though hasn't needed to use it.    Pt states his pain comes and goes and lasts for different times.  Pt states the pain can last days at times and weeks at other times, but the pain goes away.  He has pain when he is still for a prolonged time including driving, sleeping, and flying.   Pt hasn't had pain in weeks.  When he has his worst pain, he's unable to sleep.  When he has the pain, he has pain with ascending > descending stairs.  He also has pain with and has to be careful with initiating walking after sitting awhile.  Pt has deficits with walking including decreased speed when he is having pain.     Pt uses the elliptical bike and performs upper body strengthening.  His knee feels better after using the elliptical bike.   PERTINENT HISTORY: R knee pain, though L knee is worse HTN Hx of R Lumbar radiculitis, Pt  reports having 3 herniated discs.  Pt receives regular adjustments.  PAIN:  NPRS:  0/10 current and best, 7-8/10 worst Location: superior L knee, distal quad  PRECAUTIONS: None   WEIGHT BEARING RESTRICTIONS: No  FALLS:  Has patient fallen in last 6 months? 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  DIAGNOSTIC FINDINGS: MRI on 01/19/24: IMPRESSION: 1. Negative for meniscal or ligament tear. 2.  Patellofemoral osteoarthritis which is most notable at the apex of the patella where cartilage is denuded and there is fluid undermining a small flap of cartilage on the lateral patellar facet. 3. 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  PATIENT SURVEYS:  LEFS 69/80  COGNITION: Overall cognitive status: Within functional limits for tasks assessed      MUSCLE LENGTH: Tightness in bilat HS   PALPATION: Pt had no tenderness to palpation t/o L knee   LOWER EXTREMITY ROM:  Active ROM Right eval Left eval  Hip flexion    Hip extension    Hip abduction [facility]  Hip adduction [facility]  Hip internal rotation  WFL  Hip external rotation 34 29  Knee flexion 131 142  Knee extension 0 0  Ankle dorsiflexion    Ankle plantarflexion    Ankle inversion    Ankle eversion     (Blank rows = not tested)  LOWER EXTREMITY MMT:  MMT Right eval Left eval  Hip flexion 5/5 5/5  Hip extension 5/5 5/5  Hip abduction 5/5 5/5  Hip adduction    Hip internal rotation    Hip external rotation 5/5 4+/5  Knee flexion 5/5 5/5  Knee extension 5/5 5/5  Ankle dorsiflexion    Ankle plantarflexion    Ankle inversion    Ankle eversion     (Blank rows = not tested)    FUNCTIONAL TESTS:  Pt has good form with squats with knees having slight anterior translation but not past the toes.   GAIT: Comments: Pt ambulates with a normalized heel to toe gait pattern without limping.  He has equal stance in bilat LE's.                                                                                                                                 TREATMENT:    03/24/24: Leg press Knee ext Knee flexion Standing heel raises Hip abd Hip add Lat pull down cable Row cable   03/17/24: STM Rt lateral hip Supine HSS & piriformis stretch- also in seated Sit<>stand Dead lift- staggered stance for form Wall squat- static & with red swiss ball Lunge with pull off bar Discussed  possible use of arch supports in dress shoes  Eval:  Pt performed supine SLR, S/L hip abd, supine SLR with ER x 10 reps each.  Pt received a HEP handout and was educated in correct form and appropriate frequency.  PT instructed pt he should not have knee or back pain with exercises.    PATIENT EDUCATION:  Education details: dx, relevant anatomy, rationale of interventions, HEP, objective findings, and POC.  PT answered pt's questions. Person educated: Patient Education method: Explanation, Demonstration, Tactile cues, Verbal cues, and Handouts Education comprehension: verbalized understanding, returned demonstration, verbal cues required, and tactile cues required  HOME EXERCISE PROGRAM: Access Code: [email or web address] URL: [email or web address] Date: 02/27/2024 Prepared by: Marnie Siren  Exercises - Supine Active Straight Leg Raise  - 1 x daily - 6-7 x weekly - 2 sets - 10 reps - Straight Leg Raise with External Rotation  - 1 x daily - 5-7 x weekly - 2 sets - 10 reps - Sidelying Hip Abduction  - 1 x daily - 6-7 x weekly - 2 sets - 10 reps  ASSESSMENT:  CLINICAL IMPRESSION: All machines began with light weight and cues for core engagement. Re-activated [employer] in order to progress with workout program, handout of machines performed created and provided to patient.   OBJECTIVE IMPAIRMENTS: decreased mobility, decreased strength, impaired flexibility, and pain.   ACTIVITY LIMITATIONS: sitting, sleeping, stairs, and locomotion level  PARTICIPATION LIMITATIONS: driving  PERSONAL FACTORS: 1-2 comorbidities: R knee pain and lumbar radiculitis  are also affecting patient's functional outcome.   REHAB POTENTIAL: Good  CLINICAL DECISION MAKING: Stable/uncomplicated  EVALUATION COMPLEXITY: Low   GOALS:   SHORT TERM GOALS: Target date: 03/19/2024  Pt will be independent and compliant with HEP for improved pain, strength, and function.  Baseline: Goal status:  INITIAL  2.  Pt will tolerate progression of exercises without adverse effects for improved hip and knee strength and stability to reduce occurrences of pain, improve functional tolerance, and to decrease the intensity of pain.  Baseline:  Goal status: INITIAL   LONG TERM GOALS: Target date: 04/09/2024  If pt has pain, he will report at least a 60% improvement in sleeping. Baseline:  Goal status: INITIAL  2.  Pt will score 0-1 on the lateral step test for improved quad eccentric control and improved performance of stairs.  Baseline:  Goal status: INITIAL  3.  Pt will demo improved L hip ER strength to 5/5 MMT for improved knee support and tolerance with daily activities and mobility.  Baseline:  Goal status: INITIAL  4.  If or when he has pain, he will report at least a 70% reduction in the intensity of pain.  Baseline:  Goal status: INITIAL     PLAN:  PT FREQUENCY: 1-2x/week.  Plan to start out 1x/wk  PT DURATION: 6 weeks  PLANNED INTERVENTIONS: 97164- PT Re-evaluation, 97110-Therapeutic exercises, 97530- Therapeutic activity, [id number]- Neuromuscular re-education, 97535- Self Care, 16109- Manual therapy, [phone]- Gait training, [phone]- Aquatic Therapy, [phone]- Electrical stimulation (unattended), [phone]- Electrical stimulation (manual), 97016- Vasopneumatic device, 97035- Ultrasound, Patient/Family education, Balance training, Stair training, Taping, Dry Needling, Joint mobilization, Spinal mobilization, Cryotherapy, and Moist heat  PLAN FOR NEXT SESSION: Cont with quad and hip strengthening.  HS flexibility.  Review and perform HEP.  Update HEP per pt tolerance.   Ina Poupard C. Carmyn Hamm PT, DPT 03/24/24 4:57 PM

## 2024-04-03 ENCOUNTER — Other Ambulatory Visit (HOSPITAL_COMMUNITY): Payer: Self-pay

## 2024-04-04 ENCOUNTER — Ambulatory Visit (HOSPITAL_BASED_OUTPATIENT_CLINIC_OR_DEPARTMENT_OTHER): Admitting: Physical Therapy

## 2024-04-07 ENCOUNTER — Ambulatory Visit (HOSPITAL_BASED_OUTPATIENT_CLINIC_OR_DEPARTMENT_OTHER): Attending: Family Medicine | Admitting: Physical Therapy

## 2024-04-07 ENCOUNTER — Encounter (HOSPITAL_BASED_OUTPATIENT_CLINIC_OR_DEPARTMENT_OTHER): Payer: Self-pay | Admitting: Physical Therapy

## 2024-04-07 DIAGNOSIS — M25562 Pain in left knee: Secondary | ICD-10-CM | POA: Diagnosis not present

## 2024-04-07 DIAGNOSIS — M6281 Muscle weakness (generalized): Secondary | ICD-10-CM | POA: Diagnosis not present

## 2024-04-07 NOTE — Therapy (Signed)
 OUTPATIENT PHYSICAL THERAPY LOWER EXTREMITY TREATMENT   Patient Name: Larry [name] MRN: [id number] DOB:1961/10/29, 63 y.o., male Today's Date: 04/08/2024  END OF SESSION:  PT End of Session - 04/07/24 1632     Visit Number 4    Number of Visits 8    Date for PT Re-Evaluation 04/09/24    Authorization Type Garth Kansky    PT Start Time 1628    PT Stop Time 1713    PT Time Calculation (min) 45 min    Activity Tolerance Patient tolerated treatment well    Behavior During Therapy [facility] for tasks assessed/performed                Past Medical History:  Diagnosis Date   High cholesterol    Hyperlipidemia    Hypertension    Past Surgical History:  Procedure Laterality Date   APPENDECTOMY     Patient Active Problem List   Diagnosis Date Noted   Middle ear effusion 05/08/2023   Bilateral knee pain 03/22/2023   High ankle sprain, right, initial encounter 01/31/2023   Right wrist pain 11/14/2022   Left elbow contusion 09/21/2022   Acute upper respiratory infection 11/03/2021   Left rotator cuff tear 05/11/2020   Nonallopathic lesion of thoracic region 07/09/2018   Nonallopathic lesion of cervical region 07/09/2018   Nonallopathic lesion of lumbar region 07/09/2018   Nonallopathic lesion of sacral region 07/09/2018   Right rotator cuff tendinitis 04/12/2017   Right lumbar radiculitis 09/29/2014   Foot pain, left 09/08/2014   HYPERLIPIDEMIA 10/09/2010     REFERRING PROVIDER: Isidro Margo, DO  REFERRING DIAG: [phone] (ICD-10-CM) - Left knee pain, unspecified chronicity  THERAPY DIAG:  Left knee pain, unspecified chronicity  Muscle weakness (generalized)  Rationale for Evaluation and Treatment: Rehabilitation  ONSET DATE: Fall 2024  SUBJECTIVE:   SUBJECTIVE STATEMENT: He goes by Larry [name]".  Pt states he feels about the same which is good.  He denies pain currently.  Pt has been using the orthotics in his shoes and loves them.  Pt denies any  soreness or adverse effects after prior Rx.  Pt used the elliptical for 30 mins this AM.      PERTINENT HISTORY: R knee pain, though L knee is worse HTN Hx of R Lumbar radiculitis, Pt reports having 3 herniated discs.  Pt receives regular adjustments.  PAIN:  NPRS:  0/10 current and best, 7-8/10 worst Location: superior L knee, distal quad  PRECAUTIONS: None   WEIGHT BEARING RESTRICTIONS: No  FALLS:  Has patient fallen in last 6 months? 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  DIAGNOSTIC FINDINGS: MRI on 01/19/24: IMPRESSION: 1. Negative for meniscal or ligament tear. 2. Patellofemoral osteoarthritis which is most notable at the apex of the patella where cartilage is denuded and there is fluid undermining a small flap of cartilage on the lateral patellar facet. 3. 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  PATIENT SURVEYS:  LEFS 69/80  COGNITION: Overall cognitive status: Within functional limits for tasks assessed      MUSCLE LENGTH: Tightness in bilat HS   PALPATION: Pt had no tenderness to palpation t/o L knee   LOWER EXTREMITY ROM:  Active ROM Right  eval Left eval  Hip flexion    Hip extension    Hip abduction [facility]  Hip adduction [facility]  Hip internal rotation  [facility]  Hip external rotation 34 29  Knee flexion 131 142  Knee extension 0 0  Ankle dorsiflexion    Ankle plantarflexion    Ankle inversion    Ankle eversion     (Blank rows = not tested)  LOWER EXTREMITY MMT:  MMT Right eval Left eval  Hip flexion 5/5 5/5  Hip extension 5/5 5/5  Hip abduction 5/5 5/5  Hip adduction    Hip internal rotation    Hip external rotation 5/5 4+/5  Knee  flexion 5/5 5/5  Knee extension 5/5 5/5  Ankle dorsiflexion    Ankle plantarflexion    Ankle inversion    Ankle eversion     (Blank rows = not tested)    FUNCTIONAL TESTS:  Pt has good form with squats with knees having slight anterior translation but not past the toes.   GAIT: Comments: Pt ambulates with a normalized heel to toe gait pattern without limping.  He has equal stance in bilat LE's.                                                                                                                                 TREATMENT:    04/07/24: Reviewed pt presentation, pain level, HEP compliance, and response to prior Rx.  PT reviewed gym program.  Recumbent bike x 5 mins L3 S/L clamshell 2x10 Supine HS stretch 2x30 sec bilat Lateral band walks with RTB x 1 lap and GTB x 2 laps at rail Cybex Leg press 50# x 10, 65# x 10, 70# x 10 Seated [employer] HS curl 1 set of 10-15, 2 sets of 15 reps Squat with ball on wall    03/24/24: Leg press Knee ext Knee flexion Standing heel raises Hip abd Hip add Lat pull down cable Row cable   03/17/24: STM Rt lateral hip Supine HSS & piriformis stretch- also in seated Sit<>stand Dead lift- staggered stance for form Wall squat- static & with red swiss ball Lunge with pull off bar Discussed possible use of arch supports in dress shoes   PATIENT EDUCATION:  Education details: dx, relevant anatomy, rationale of interventions, HEP, objective findings, and POC.  PT answered pt's questions. Person educated: Patient Education method: Explanation, Demonstration, Tactile cues, Verbal cues, and Handouts Education comprehension: verbalized understanding, returned demonstration, verbal cues required, and tactile cues required  HOME EXERCISE PROGRAM: Access Code: [email or web address] URL: [email or web address] Date: 02/27/2024 Prepared by: Marnie Siren  Exercises - Supine Active Straight Leg Raise  - 1 x daily - 6-7 x weekly - 2  sets - 10 reps - Straight Leg Raise with External Rotation  - 1 x daily - 5-7 x weekly - 2 sets - 10 reps - Sidelying Hip Abduction  - 1 x daily - 6-7 x  weekly - 2 sets - 10 reps  ASSESSMENT:  CLINICAL IMPRESSION: Pt is doing well as evidenced by subjective reports.  He has been using orthotics which have helped.  PT reviewed gym program with pt and answered questions.  Pt performed exercises well with cuing and instruction in correct form and positioning.  PT educated pt concerning proper set up of gym machines.  Pt attempted squats with p-ball on wall though had difficulty with correct form and could feel it in his knee.  PT instructed pt to not perform that exercise.  Pt performed exercises without knee brace.  He responded well to Rx having no pain and no c/o's after Rx.  Patient may be ready for discharge next treatment.   OBJECTIVE IMPAIRMENTS: decreased mobility, decreased strength, impaired flexibility, and pain.   ACTIVITY LIMITATIONS: sitting, sleeping, stairs, and locomotion level  PARTICIPATION LIMITATIONS: driving  PERSONAL FACTORS: 1-2 comorbidities: R knee pain and lumbar radiculitis are also affecting patient's functional outcome.   REHAB POTENTIAL: Good  CLINICAL DECISION MAKING: Stable/uncomplicated  EVALUATION COMPLEXITY: Low   GOALS:   SHORT TERM GOALS: Target date: 03/19/2024  Pt will be independent and compliant with HEP for improved pain, strength, and function.  Baseline: Goal status: INITIAL  2.  Pt will tolerate progression of exercises without adverse effects for improved hip and knee strength and stability to reduce occurrences of pain, improve functional tolerance, and to decrease the intensity of pain.  Baseline:  Goal status: INITIAL   LONG TERM GOALS: Target date: 04/09/2024  If pt has pain, he will report at least a 60% improvement in sleeping. Baseline:  Goal status: INITIAL  2.  Pt will score 0-1 on the lateral step test for improved quad  eccentric control and improved performance of stairs.  Baseline:  Goal status: INITIAL  3.  Pt will demo improved L hip ER strength to 5/5 MMT for improved knee support and tolerance with daily activities and mobility.  Baseline:  Goal status: INITIAL  4.  If or when he has pain, he will report at least a 70% reduction in the intensity of pain.  Baseline:  Goal status: INITIAL     PLAN:  PT FREQUENCY: 1-2x/week.  Plan to start out 1x/wk  PT DURATION: 6 weeks  PLANNED INTERVENTIONS: 97164- PT Re-evaluation, 97110-Therapeutic exercises, 97530- Therapeutic activity, [id number]- Neuromuscular re-education, 97535- Self Care, 56213- Manual therapy, [phone]- Gait training, [phone]- Aquatic Therapy, [phone]- Electrical stimulation (unattended), [phone]- Electrical stimulation (manual), 97016- Vasopneumatic device, 97035- Ultrasound, Patient/Family education, Balance training, Stair training, Taping, Dry Needling, Joint mobilization, Spinal mobilization, Cryotherapy, and Moist heat  PLAN FOR NEXT SESSION:  Cont with quad and hip strengthening.  HS flexibility.  Review and update HEP.  1 more visit then probable discharge.    Trina Fujita III PT, DPT 04/08/24 9:50 PM

## 2024-04-18 NOTE — Progress Notes (Unsigned)
  [facility] [address] [place] [address] Phone: [phone] Subjective:   IBryan Caprio, am serving as a scribe for Dr. Ronnell Coins.  I'm seeing this patient by the request  of:  Rice, Parley Bolls, MD  CC: back and neck pain   [id number]  Larry [name] is a 63 y.o. male coming in with complaint of back and neck pain. OMT on 02/04/2024. Patient states   Medications patient has been prescribed:   Taking:         Reviewed prior external information including notes and imaging from previsou exam, outside providers and external EMR if available.   As well as notes that were available from care everywhere and other healthcare systems.  Past medical history, social, surgical and family history all reviewed in electronic medical record.  No pertanent information unless stated regarding to the chief complaint.   Past Medical History:  Diagnosis Date   High cholesterol    Hyperlipidemia    Hypertension     Allergies  Allergen Reactions   Amlodipine  Swelling    Significant ankle edema     Review of Systems:  No headache, visual changes, nausea, vomiting, diarrhea, constipation, dizziness, abdominal pain, skin rash, fevers, chills, night sweats, weight loss, swollen lymph nodes, body aches, joint swelling, chest pain, shortness of breath, mood changes. POSITIVE muscle aches  Objective  There were no vitals taken for this visit.   General: No apparent distress alert and oriented x3 mood and affect normal, dressed appropriately.  HEENT: Pupils equal, extraocular movements intact  Respiratory: Patient's speak in full sentences and does not appear short of breath  Cardiovascular: No lower extremity edema, non tender, no erythema  Gait MSK:  Back does have some loss lordosis noted.  Some tightness noted in the paraspinal musculature.  No radicular symptoms on exam today.  Still has tightness more of the right hamstring than the  contralateral side.  Osteopathic findings  T6 extended rotated and side bent left L2 flexed rotated and side bent right L4 flexed rotated and side bent right Sacrum right on right    Assessment and Plan:  No problem-specific Assessment & Plan notes found for this encounter.    Nonallopathic problems  Decision today to treat with OMT was based on Physical Exam  After verbal consent patient was treated with HVLA, ME, FPR techniques in thoracic, lumbar, and sacral  areas  Patient tolerated the procedure well with improvement in symptoms  Patient given exercises, stretches and lifestyle modifications  See medications in patient instructions if given  Patient will follow up in 4-8 weeks    The above documentation has been reviewed and is accurate and complete Ketara Cavness M Darel Ricketts, DO          Note: This dictation was prepared with Dragon dictation along with smaller phrase technology. Any transcriptional errors that result from this process are unintentional.

## 2024-04-23 ENCOUNTER — Encounter: Payer: Self-pay | Admitting: Family Medicine

## 2024-04-23 ENCOUNTER — Ambulatory Visit: Admitting: Family Medicine

## 2024-04-23 VITALS — BP 122/80 | HR 79 | Ht 73.0 in | Wt 197.0 lb

## 2024-04-23 DIAGNOSIS — M9904 Segmental and somatic dysfunction of sacral region: Secondary | ICD-10-CM | POA: Diagnosis not present

## 2024-04-23 DIAGNOSIS — M9903 Segmental and somatic dysfunction of lumbar region: Secondary | ICD-10-CM

## 2024-04-23 DIAGNOSIS — M5416 Radiculopathy, lumbar region: Secondary | ICD-10-CM | POA: Diagnosis not present

## 2024-04-23 DIAGNOSIS — M9902 Segmental and somatic dysfunction of thoracic region: Secondary | ICD-10-CM | POA: Diagnosis not present

## 2024-04-23 NOTE — Assessment & Plan Note (Signed)
 Likely no radicular symptoms at this time.  Continuing to workout on a fairly regular basis.  Discussed icing regimen and home exercises, discussed which activities to do things to avoid.  Increase activity slowly.  Follow-up again in 6 to 8 weeks

## 2024-04-23 NOTE — Patient Instructions (Signed)
 Good to see you! Have a great cruise Keep me updated on the book See you again in 6-8 weeks

## 2024-04-28 DIAGNOSIS — H52223 Regular astigmatism, bilateral: Secondary | ICD-10-CM | POA: Diagnosis not present

## 2024-05-27 ENCOUNTER — Ambulatory Visit (HOSPITAL_BASED_OUTPATIENT_CLINIC_OR_DEPARTMENT_OTHER): Attending: Family Medicine | Admitting: Physical Therapy

## 2024-05-27 DIAGNOSIS — M6281 Muscle weakness (generalized): Secondary | ICD-10-CM | POA: Insufficient documentation

## 2024-05-27 DIAGNOSIS — M25562 Pain in left knee: Secondary | ICD-10-CM | POA: Diagnosis not present

## 2024-05-27 NOTE — Therapy (Signed)
 OUTPATIENT PHYSICAL THERAPY LOWER EXTREMITY TREATMENT   Patient Name: Larry [name] MRN: [id number] DOB:Jul 20, 1961, 63 y.o., male Today's Date: 05/28/2024  END OF SESSION:  PT End of Session - 05/27/24 1628     Visit Number 5    Number of Visits 5    Authorization Type Jolynn Davene LEYDEN    PT Start Time 1628    PT Stop Time 1715    PT Time Calculation (min) 47 min    Activity Tolerance Patient tolerated treatment well    Behavior During Therapy [facility] for tasks assessed/performed             Past Medical History:  Diagnosis Date   High cholesterol    Hyperlipidemia    Hypertension    Past Surgical History:  Procedure Laterality Date   APPENDECTOMY     Patient Active Problem List   Diagnosis Date Noted   Middle ear effusion 05/08/2023   Bilateral knee pain 03/22/2023   High ankle sprain, right, initial encounter 01/31/2023   Right wrist pain 11/14/2022   Left elbow contusion 09/21/2022   Acute upper respiratory infection 11/03/2021   Left rotator cuff tear 05/11/2020   Nonallopathic lesion of thoracic region 07/09/2018   Nonallopathic lesion of cervical region 07/09/2018   Nonallopathic lesion of lumbar region 07/09/2018   Nonallopathic lesion of sacral region 07/09/2018   Right rotator cuff tendinitis 04/12/2017   Right lumbar radiculitis 09/29/2014   Foot pain, left 09/08/2014   HYPERLIPIDEMIA 10/09/2010     REFERRING PROVIDER: Claudene Arthea HERO, DO  REFERRING DIAG: [phone] (ICD-10-CM) - Left knee pain, unspecified chronicity  THERAPY DIAG:  Left knee pain, unspecified chronicity  Muscle weakness (generalized)  Rationale for Evaluation and Treatment: Rehabilitation  ONSET DATE: Fall 2024  SUBJECTIVE:   SUBJECTIVE STATEMENT: He goes by Riva.  Pt has been coming to the gym 2-3x/wk and doing his exercises at home. Pt states his knee is feeling good.  He denies pain currently and hasn't been having any pain since before last Rx.  Pt has been  using the orthotics in his shoes and loves them.  Pt denies any soreness or adverse effects after prior Rx.  Pt used the elliptical for 30 mins this AM.  Pt reports PT has increased his motivation to return to his gym workouts.  Pt reports 90-95% improvement in sleeping.       PERTINENT HISTORY: R knee pain, though L knee is worse HTN Hx of R Lumbar radiculitis, Pt reports having 3 herniated discs.  Pt receives regular adjustments.  PAIN:  NPRS:  0/10 current and best, 7-8/10 worst Location: superior L knee, distal quad  PRECAUTIONS: None   WEIGHT BEARING RESTRICTIONS: No  FALLS:  Has patient fallen in last 6 months? 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  DIAGNOSTIC FINDINGS: MRI on 01/19/24: IMPRESSION: 1. Negative for meniscal or ligament tear. 2. Patellofemoral osteoarthritis which is most notable at the apex of the patella where cartilage is denuded and there is fluid undermining a small flap of cartilage on the lateral patellar facet. 3. 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   COGNITION: Overall cognitive status: Within functional limits for tasks assessed  MUSCLE LENGTH: Tightness in bilat HS                                                                                                                               TREATMENT:    05/27/24: Scifit recument bike L3-4 x 5 mins  L Hip ER strength:  5/5 MMT Pt performed heel taps on 4 inch step.  Attempted lateral step down test though pt had difficulty with performing.   S/L clams 2x12, RTB x 8 Supine manual HS stretch x30 sec bilat Supine HS stretch with strap x 20 sec bilat Seated HS stretch x20 sec  bilat Lateral band walks with RTB around thighs x 2 laps at rail  Reviewed gym program and HEP.  Updated HEP and gave pt a HEP handout.  PATIENT SURVEYS:  LEFS:  INITIAL/CURRENT:  69/80 /  72/80      PATIENT EDUCATION:  Education details: dx, relevant anatomy, rationale of interventions, HEP, objective findings, and POC/discharge planning.  PT answered pt's questions. Person educated: Patient Education method: Explanation, Demonstration, Verbal cues, and Handouts Education comprehension: verbalized understanding, returned demonstration, verbal cues required  HOME EXERCISE PROGRAM: Access Code: [email or web address] URL: [email or web address] Date: 02/27/2024 Prepared by: Mose Minerva  Exercises - Supine Active Straight Leg Raise  - 1 x daily - 6-7 x weekly - 2 sets - 10 reps - Straight Leg Raise with External Rotation  - 1 x daily - 5-7 x weekly - 2 sets - 10 reps - Sidelying Hip Abduction  - 1 x daily - 6-7 x weekly - 2 sets - 10 reps  Updated HEP: - Supine Hamstring Stretch with Strap  - 2 x daily - 7 x weekly - 2 reps - 20 seconds hold - Clamshell  - 1 x daily - 6-7 x weekly - 2 sets - 10 reps - Side Stepping with Resistance at Ankles and Counter Support  - 2-3 x weekly - 2-3 reps  ASSESSMENT:  CLINICAL IMPRESSION: Pt is doing well and states his knee is feeling good.  He is not having any pain.  Pt reports 90-95% improvement in sleeping.  Pt has been going to the gym and performing his exercises at home.  PT reviewed HEP and gym program.  PT updated HEP and spent time educating pt concerning correct form and appropriate frequency.  PT educated pt concerning appropriate frequency of gym exercises also.  Pt received a HEP handout and is independent with HEP and gym program.  PT assessed L Hip ER strength which has improved to 5/5 MMT.  Pt performed heel taps on 4 inch step.  Attempted lateral step down test though pt had difficulty with performing.  Pt had difficulty with  correct form with heel taps and lateral step down test.  Pt has met all goals except LTG #2 and LTG #4 being N/A.  He responded well to Rx having no pain and no c/o's after Rx.  Patient is  ready for discharge.      OBJECTIVE IMPAIRMENTS: decreased mobility, decreased strength, impaired flexibility, and pain.   ACTIVITY LIMITATIONS: sitting, sleeping, stairs, and locomotion level  PARTICIPATION LIMITATIONS: driving  PERSONAL FACTORS: 1-2 comorbidities: R knee pain and lumbar radiculitis are also affecting patient's functional outcome.   REHAB POTENTIAL: Good  CLINICAL DECISION MAKING: Stable/uncomplicated  EVALUATION COMPLEXITY: Low   GOALS:   SHORT TERM GOALS: Target date: 03/19/2024  Pt will be independent and compliant with HEP for improved pain, strength, and function.  Baseline: Goal status: GOAL MET  7/1  2.  Pt will tolerate progression of exercises without adverse effects for improved hip and knee strength and stability to reduce occurrences of pain, improve functional tolerance, and to decrease the intensity of pain.  Baseline:  Goal status: GOAL MET  7/1   LONG TERM GOALS: Target date: 04/09/2024  If pt has pain, he will report at least a 60% improvement in sleeping. Baseline:  Goal status: GOAL MET  7/1  2.  Pt will score 0-1 on the lateral step test for improved quad eccentric control and improved performance of stairs.  Baseline:  Goal status: NOT MET  3.  Pt will demo improved L hip ER strength to 5/5 MMT for improved knee support and tolerance with daily activities and mobility.  Baseline:  Goal status:  GOAL MET  7/1  4.  If or when he has pain, he will report at least a 70% reduction in the intensity of pain.  Baseline:  Goal status: N/A--Pt's pain did not return.      PLAN:  PT FREQUENCY: 1 visit  PT DURATION: 1 visit  PLANNED INTERVENTIONS: 97164- PT Re-evaluation, 97110-Therapeutic exercises, 97530- Therapeutic activity, [id number]-  Neuromuscular re-education, 97535- Self Care, 02859- Manual therapy, [phone]- Gait training, [phone]- Aquatic Therapy, [phone]- Electrical stimulation (unattended), [phone]- Electrical stimulation (manual), 97016- Vasopneumatic device, 97035- Ultrasound, Patient/Family education, Balance training, Stair training, Taping, Dry Needling, Joint mobilization, Spinal mobilization, Cryotherapy, and Moist heat  PLAN FOR NEXT SESSION:  Pt to be discharged from skilled PT due to meeting the majority of goals and not having any pain.  He will cont with gym program and HEP.  Pt is agreeable with discharge.   PHYSICAL THERAPY DISCHARGE SUMMARY  Visits from Start of Care: 5  Current functional level related to goals / functional outcomes: See above   Remaining deficits: See above   Education / Equipment: See above   Leigh Minerva III PT, DPT 05/28/24 11:47 PM

## 2024-05-28 ENCOUNTER — Encounter (HOSPITAL_BASED_OUTPATIENT_CLINIC_OR_DEPARTMENT_OTHER): Payer: Self-pay | Admitting: Physical Therapy

## 2024-05-28 NOTE — Addendum Note (Signed)
 Addended by: MARGRETTE LEIGH RAMAN on: 05/28/2024 11:48 PM   Modules accepted: Orders

## 2024-06-04 NOTE — Progress Notes (Signed)
  [name] [address] [place] [address] Phone: [phone] Subjective:   Larry [name], am serving as a scribe for Dr. Arthea [name].  I'm seeing this patient by the request  of:  Larry [name] GRADE, MD  CC: Back and neck pain follow-up  YEP:Dlagzrupcz  Larry [name] is a 63 y.o. male coming in with complaint of back and neck pain. OMT on 04/23/2024. Patient states same per usual. No new symptoms.  Medications patient has been prescribed:   Taking:         Reviewed prior external information including notes and imaging from previsou exam, outside providers and external EMR if available.   As well as notes that were available from care everywhere and other healthcare systems.  Past medical history, social, surgical and family history all reviewed in electronic medical record.  No pertanent information unless stated regarding to the chief complaint.   Past Medical History:  Diagnosis Date   High cholesterol    Hyperlipidemia    Hypertension     Allergies  Allergen Reactions   Amlodipine  Swelling    Significant ankle edema     Review of Systems:  No headache, visual changes, nausea, vomiting, diarrhea, constipation, dizziness, abdominal pain, skin rash, fevers, chills, night sweats, weight loss, swollen lymph nodes, body aches, joint swelling, chest pain, shortness of breath, mood changes. POSITIVE muscle aches  Objective  Blood pressure 124/80, pulse 82, height 6' 1 (1.854 m), weight 201 lb (91.2 kg), SpO2 96%.   General: No apparent distress alert and oriented x3 mood and affect normal, dressed appropriately.  HEENT: Pupils equal, extraocular movements intact  Respiratory: Patient's speak in full sentences and does not appear short of breath  Cardiovascular: No lower extremity edema, non tender, no erythema  Gait relatively normal MSK:  Back of the low back does have some tightness noted.  Some tightness with FABER  test noted.  Still seems to be over the sacroiliac joint as well.  Negative straight leg test noted today but still tightness of hamstring.  Osteopathic findings  C2 flexed rotated and side bent right C6 flexed rotated and side bent left T3 extended rotated and side bent right inhaled rib T8 extended rotated and side bent left L1 flexed rotated and side bent right L3 flexed rotated and side bent left Sacrum right on right    Assessment and Plan:  Right lumbar radiculitis Tightness on the right side that did need more muscle energy.  Patient did respond extremely well to this.  Did have significant loosening noted.  No change in medication at this time.  Follow-up with me again 2 months    Nonallopathic problems  Decision today to treat with OMT was based on Physical Exam  After verbal consent patient was treated with HVLA, ME, FPR techniques in cervical, rib, thoracic, lumbar, and sacral  areas  Patient tolerated the procedure well with improvement in symptoms  Patient given exercises, stretches and lifestyle modifications  See medications in patient instructions if given  Patient will follow up in 4-8 weeks     The above documentation has been reviewed and is accurate and complete Larry Sires [name], DO         Note: This dictation was prepared with Dragon dictation along with smaller phrase technology. Any transcriptional errors that result from this process are unintentional.

## 2024-06-09 ENCOUNTER — Encounter: Payer: Self-pay | Admitting: Family Medicine

## 2024-06-09 ENCOUNTER — Ambulatory Visit: Admitting: Family Medicine

## 2024-06-09 VITALS — BP 124/80 | HR 82 | Ht 73.0 in | Wt 201.0 lb

## 2024-06-09 DIAGNOSIS — M9903 Segmental and somatic dysfunction of lumbar region: Secondary | ICD-10-CM | POA: Diagnosis not present

## 2024-06-09 DIAGNOSIS — M9908 Segmental and somatic dysfunction of rib cage: Secondary | ICD-10-CM

## 2024-06-09 DIAGNOSIS — M9901 Segmental and somatic dysfunction of cervical region: Secondary | ICD-10-CM

## 2024-06-09 DIAGNOSIS — M9902 Segmental and somatic dysfunction of thoracic region: Secondary | ICD-10-CM | POA: Diagnosis not present

## 2024-06-09 DIAGNOSIS — M9904 Segmental and somatic dysfunction of sacral region: Secondary | ICD-10-CM | POA: Diagnosis not present

## 2024-06-09 DIAGNOSIS — M5416 Radiculopathy, lumbar region: Secondary | ICD-10-CM | POA: Diagnosis not present

## 2024-06-09 NOTE — Patient Instructions (Signed)
 3 ibuprofen a night Keep working on core See you again in 2 months

## 2024-06-09 NOTE — Assessment & Plan Note (Signed)
 Tightness on the right side that did need more muscle energy.  Patient did respond extremely well to this.  Did have significant loosening noted.  No change in medication at this time.  Follow-up with me again 2 months

## 2024-08-03 ENCOUNTER — Other Ambulatory Visit (HOSPITAL_COMMUNITY): Payer: Self-pay

## 2024-08-06 ENCOUNTER — Other Ambulatory Visit (HOSPITAL_COMMUNITY): Payer: Self-pay

## 2024-08-07 ENCOUNTER — Other Ambulatory Visit (HOSPITAL_COMMUNITY): Payer: Self-pay

## 2024-08-07 MED ORDER — LOSARTAN POTASSIUM 50 MG PO TABS
50.0000 mg | ORAL_TABLET | Freq: Two times a day (BID) | ORAL | 3 refills | Status: AC
  Filled 2024-08-07: qty 180, 90d supply, fill #0
  Filled 2024-11-02: qty 180, 90d supply, fill #1

## 2024-08-08 NOTE — Progress Notes (Signed)
  [name] [address] [place] [address] Phone: [phone] Subjective:   ISusannah Gully, am serving as a scribe for Dr. Arthea Claudene.  I'm seeing this patient by the request  of:  Rice, Elsie GRADE, MD  CC: Back and neck pain follow-up  YEP:Dlagzrupcz  STANISLAV GERVASE is a 63 y.o. male coming in with complaint of back and neck pain. OMT on 06/09/2024. Patient states doing well. No new symptoms.         Reviewed prior external information including notes and imaging from previsou exam, outside providers and external EMR if available.   As well as notes that were available from care everywhere and other healthcare systems.  Past medical history, social, surgical and family history all reviewed in electronic medical record.  No pertanent information unless stated regarding to the chief complaint.   Past Medical History:  Diagnosis Date   High cholesterol    Hyperlipidemia    Hypertension     Allergies  Allergen Reactions   Amlodipine  Swelling    Significant ankle edema     Review of Systems:  No headache, visual changes, nausea, vomiting, diarrhea, constipation, dizziness, abdominal pain, skin rash, fevers, chills, night sweats, weight loss, swollen lymph nodes, body aches, joint swelling, chest pain, shortness of breath, mood changes. POSITIVE muscle aches  Objective  Blood pressure 114/82, pulse 81, height 6' 1 (1.854 m), weight 202 lb (91.6 kg), SpO2 98%.   General: No apparent distress alert and oriented x3 mood and affect normal, dressed appropriately.  HEENT: Pupils equal, extraocular movements intact  Respiratory: Patient's speak in full sentences and does not appear short of breath  Cardiovascular: No lower extremity edema, non tender, no erythema  MSK:  Back loss of lordosis patient does have more tightness noted around the right sacroiliac joint today.  Some tenderness to palpation in the paraspinal  musculature.  Osteopathic findings  C3 flexed rotated and side bent right C5 flexed rotated and side bent left T7 extended rotated and side bent left L1 flexed rotated and side bent right L3 flexed rotated and side bent left Sacrum right on right     Assessment and Plan:  Right lumbar radiculitis We discussed icing regimen and home exercises, discussed which activities to do and which ones to avoid.  Increase activity slowly.  Discussed which activities to do and which ones to avoid.  Increase activity slowly.  Follow-up again in 6 to 8 weeks.    Nonallopathic problems  Decision today to treat with OMT was based on Physical Exam  After verbal consent patient was treated with HVLA, ME, FPR techniques in cervical, , thoracic, lumbar, and sacral  areas  Patient tolerated the procedure well with improvement in symptoms  Patient given exercises, stretches and lifestyle modifications  See medications in patient instructions if given  Patient will follow up in 4-8 weeks     The above documentation has been reviewed and is accurate and complete Petro Talent M Mazy Culton, DO         Note: This dictation was prepared with Dragon dictation along with smaller phrase technology. Any transcriptional errors that result from this process are unintentional.

## 2024-08-12 ENCOUNTER — Ambulatory Visit: Admitting: Family Medicine

## 2024-08-12 ENCOUNTER — Encounter: Payer: Self-pay | Admitting: Family Medicine

## 2024-08-12 VITALS — BP 114/82 | HR 81 | Ht 73.0 in | Wt 202.0 lb

## 2024-08-12 DIAGNOSIS — M5416 Radiculopathy, lumbar region: Secondary | ICD-10-CM

## 2024-08-12 DIAGNOSIS — M9902 Segmental and somatic dysfunction of thoracic region: Secondary | ICD-10-CM

## 2024-08-12 DIAGNOSIS — M9903 Segmental and somatic dysfunction of lumbar region: Secondary | ICD-10-CM

## 2024-08-12 DIAGNOSIS — M9901 Segmental and somatic dysfunction of cervical region: Secondary | ICD-10-CM

## 2024-08-12 DIAGNOSIS — M9904 Segmental and somatic dysfunction of sacral region: Secondary | ICD-10-CM | POA: Diagnosis not present

## 2024-08-12 NOTE — Patient Instructions (Signed)
 Good to see you! Enjoy all the travels See you again in 2 months

## 2024-08-12 NOTE — Assessment & Plan Note (Signed)
 We discussed icing regimen and home exercises, discussed which activities to do and which ones to avoid.  Increase activity slowly.  Discussed which activities to do and which ones to avoid.  Increase activity slowly.  Follow-up again in 6 to 8 weeks.

## 2024-08-21 ENCOUNTER — Other Ambulatory Visit (HOSPITAL_COMMUNITY): Payer: Self-pay

## 2024-08-21 DIAGNOSIS — R509 Fever, unspecified: Secondary | ICD-10-CM | POA: Diagnosis not present

## 2024-08-21 DIAGNOSIS — J0110 Acute frontal sinusitis, unspecified: Secondary | ICD-10-CM | POA: Diagnosis not present

## 2024-08-21 DIAGNOSIS — Z20822 Contact with and (suspected) exposure to covid-19: Secondary | ICD-10-CM | POA: Diagnosis not present

## 2024-08-21 MED ORDER — AMOXICILLIN 875 MG PO TABS
875.0000 mg | ORAL_TABLET | Freq: Two times a day (BID) | ORAL | 0 refills | Status: AC
  Filled 2024-08-21: qty 20, 10d supply, fill #0

## 2024-08-21 MED ORDER — PREDNISONE 20 MG PO TABS
40.0000 mg | ORAL_TABLET | Freq: Every day | ORAL | 0 refills | Status: AC
  Filled 2024-08-21: qty 10, 5d supply, fill #0

## 2024-08-26 ENCOUNTER — Other Ambulatory Visit (HOSPITAL_COMMUNITY): Payer: Self-pay

## 2024-08-26 MED ORDER — BENZONATATE 200 MG PO CAPS
ORAL_CAPSULE | ORAL | 0 refills | Status: AC
  Filled 2024-08-26: qty 20, 7d supply, fill #0

## 2024-09-08 ENCOUNTER — Other Ambulatory Visit (HOSPITAL_BASED_OUTPATIENT_CLINIC_OR_DEPARTMENT_OTHER): Payer: Self-pay

## 2024-09-08 MED ORDER — FLUZONE 0.5 ML IM SUSY
0.5000 mL | PREFILLED_SYRINGE | Freq: Once | INTRAMUSCULAR | 0 refills | Status: AC
Start: 2024-09-08 — End: 2024-09-09
  Filled 2024-09-08: qty 0.5, 1d supply, fill #0

## 2024-09-09 DIAGNOSIS — Z125 Encounter for screening for malignant neoplasm of prostate: Secondary | ICD-10-CM | POA: Diagnosis not present

## 2024-09-09 DIAGNOSIS — Z13220 Encounter for screening for lipoid disorders: Secondary | ICD-10-CM | POA: Diagnosis not present

## 2024-09-09 DIAGNOSIS — R972 Elevated prostate specific antigen [PSA]: Secondary | ICD-10-CM | POA: Diagnosis not present

## 2024-09-09 DIAGNOSIS — Z131 Encounter for screening for diabetes mellitus: Secondary | ICD-10-CM | POA: Diagnosis not present

## 2024-09-10 ENCOUNTER — Other Ambulatory Visit (HOSPITAL_COMMUNITY): Payer: Self-pay

## 2024-09-10 DIAGNOSIS — N528 Other male erectile dysfunction: Secondary | ICD-10-CM | POA: Diagnosis not present

## 2024-09-10 DIAGNOSIS — Z6826 Body mass index (BMI) 26.0-26.9, adult: Secondary | ICD-10-CM | POA: Diagnosis not present

## 2024-09-10 DIAGNOSIS — R972 Elevated prostate specific antigen [PSA]: Secondary | ICD-10-CM | POA: Diagnosis not present

## 2024-09-10 DIAGNOSIS — E663 Overweight: Secondary | ICD-10-CM | POA: Diagnosis not present

## 2024-09-10 DIAGNOSIS — Z0001 Encounter for general adult medical examination with abnormal findings: Secondary | ICD-10-CM | POA: Diagnosis not present

## 2024-09-10 DIAGNOSIS — F411 Generalized anxiety disorder: Secondary | ICD-10-CM | POA: Diagnosis not present

## 2024-09-10 DIAGNOSIS — R7303 Prediabetes: Secondary | ICD-10-CM | POA: Diagnosis not present

## 2024-09-10 MED ORDER — ALPRAZOLAM 0.5 MG PO TABS
ORAL_TABLET | ORAL | 3 refills | Status: AC
  Filled 2024-09-10: qty 30, 10d supply, fill #0

## 2024-10-13 ENCOUNTER — Ambulatory Visit: Admitting: Family Medicine

## 2024-10-16 NOTE — Progress Notes (Signed)
 [name] [address] [place] [address] Phone: [phone] Subjective:   Larry [name], am serving as a scribe for Dr. Arthea Claudene.  I'[name] seeing this patient by the request  of:  [name], Larry GRADE, MD  CC: Low back pain  YEP:Dlagzrupcz  Larry [name] is a 63 y.o. male coming in with complaint of back and neck pain. OMT 08/12/2024. Patient states that he has been doing ok since last visit.   Numbness in thumb, 2nd and 3rd in R hand. Elbow also started hurting over tricep insertion. No injury to this area.   One year ago he also had some numbness in L hand and fingers.   Medications patient has been prescribed: None  Taking:         Reviewed prior external information including notes and imaging from previsou exam, outside providers and external EMR if available.   As well as notes that were available from care everywhere and other healthcare systems.  Past medical history, social, surgical and family history all reviewed in electronic medical record.  No pertanent information unless stated regarding to the chief complaint.   Past Medical History:  Diagnosis Date   High cholesterol    Hyperlipidemia    Hypertension     Allergies  Allergen Reactions   Amlodipine  Swelling    Significant ankle edema     Review of Systems:  No headache, visual changes, nausea, vomiting, diarrhea, constipation, dizziness, abdominal pain, skin rash, fevers, chills, night sweats, weight loss, swollen lymph nodes, body aches, joint swelling, chest pain, shortness of breath, mood changes. POSITIVE muscle aches  Objective  Blood pressure 106/84, pulse 72, height 6' 1 (1.854 [name]), weight 202 lb (91.6 kg), SpO2 98%.   General: No apparent distress alert and oriented x3 mood and affect normal, dressed appropriately.  HEENT: Pupils equal, extraocular movements intact  Respiratory: Patient's speak in full sentences and does not appear short of  breath  Cardiovascular: No lower extremity edema, non tender, no erythema  Gait MSK:  Back low back does have some loss lordosis noted.  Some tenderness to palpation in the paraspinal musculature.  Tightness with FABER right greater than left.  Right wrist does have some mild positive Tinel's.  Worsening pain with extension noted of the wrist against resistance.  Osteopathic findings  C3 flexed rotated and side bent right T5 extended rotated and side bent right inhaled rib T9 extended rotated and side bent left L2 flexed rotated and side bent right Sacrum right on right       Assessment and Plan:  Right lumbar radiculitis Chronic problem with some mild exacerbation.  Still responding extremely well though to osteopathic manipulation.  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  Increase activity slowly.  Follow-up again in 6 to 12 weeks  Right lateral epicondylitis Elbow anatomy was reviewed, and tendinopathy was explained.  Pt. given a home rehab program. Start with isometrics and ROM, then a series of concentric and eccentric exercises should be done starting with no weight, work up to 1 lb, hammer, etc.  Use counterforce strap if working or using hands.  Formal PT would be beneficial. Emphasized stretching an cross-friction massage Emphasized proper palms up lifting biomechanics to unload ECRB Patient given a wrist brace today as well to wear at night    Nonallopathic problems  Decision today to treat with OMT was based on Physical Exam  After verbal consent patient was  treated with HVLA, ME, FPR techniques in cervical, rib, thoracic, lumbar, and sacral  areas  Patient tolerated the procedure well with improvement in symptoms  Patient given exercises, stretches and lifestyle modifications  See medications in patient instructions if given  Patient will follow up in 4-8 weeks      The above documentation has been reviewed and is  accurate and complete Larry [name] Larry Burdick, DO        Note: This dictation was prepared with Dragon dictation along with smaller phrase technology. Any transcriptional errors that result from this process are unintentional.

## 2024-10-21 ENCOUNTER — Ambulatory Visit: Admitting: Family Medicine

## 2024-10-21 ENCOUNTER — Encounter: Payer: Self-pay | Admitting: Family Medicine

## 2024-10-21 VITALS — BP 106/84 | HR 72 | Ht 73.0 in | Wt 202.0 lb

## 2024-10-21 DIAGNOSIS — M9903 Segmental and somatic dysfunction of lumbar region: Secondary | ICD-10-CM | POA: Diagnosis not present

## 2024-10-21 DIAGNOSIS — M5416 Radiculopathy, lumbar region: Secondary | ICD-10-CM

## 2024-10-21 DIAGNOSIS — M9902 Segmental and somatic dysfunction of thoracic region: Secondary | ICD-10-CM | POA: Diagnosis not present

## 2024-10-21 DIAGNOSIS — M7711 Lateral epicondylitis, right elbow: Secondary | ICD-10-CM | POA: Insufficient documentation

## 2024-10-21 DIAGNOSIS — M9901 Segmental and somatic dysfunction of cervical region: Secondary | ICD-10-CM | POA: Diagnosis not present

## 2024-10-21 DIAGNOSIS — M9904 Segmental and somatic dysfunction of sacral region: Secondary | ICD-10-CM

## 2024-10-21 DIAGNOSIS — M9908 Segmental and somatic dysfunction of rib cage: Secondary | ICD-10-CM

## 2024-10-21 NOTE — Patient Instructions (Signed)
 Right wrist brace at night No over hand lifting Think thumbs up Vertical mouse See me in 2-3 months

## 2024-10-21 NOTE — Assessment & Plan Note (Signed)
 Chronic problem with some mild exacerbation.  Still responding extremely well though to osteopathic manipulation.  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  Increase activity slowly.  Follow-up again in 6 to 12 weeks

## 2024-10-21 NOTE — Assessment & Plan Note (Signed)
 Elbow anatomy was reviewed, and tendinopathy was explained.  Pt. given a home rehab program. Start with isometrics and ROM, then a series of concentric and eccentric exercises should be done starting with no weight, work up to 1 lb, hammer, etc.  Use counterforce strap if working or using hands.  Formal PT would be beneficial. Emphasized stretching an cross-friction massage Emphasized proper palms up lifting biomechanics to unload ECRB Patient given a wrist brace today as well to wear at night

## 2024-12-19 NOTE — Progress Notes (Unsigned)
" °  [name] [address] [place] [address] Phone: [phone] Subjective:    I'm seeing this patient by the request  of:  Rice, Elsie GRADE, MD  CC:   YEP:Dlagzrupcz  Larry [name] is a 64 y.o. male coming in with complaint of back and neck pain. OMT on 08/12/2024. Patient states   Medications patient has been prescribed:   Taking:         Reviewed prior external information including notes and imaging from previsou exam, outside providers and external EMR if available.   As well as notes that were available from care everywhere and other healthcare systems.  Past medical history, social, surgical and family history all reviewed in electronic medical record.  No pertanent information unless stated regarding to the chief complaint.   Past Medical History:  Diagnosis Date   High cholesterol    Hyperlipidemia    Hypertension     Allergies[1]   Review of Systems:  No headache, visual changes, nausea, vomiting, diarrhea, constipation, dizziness, abdominal pain, skin rash, fevers, chills, night sweats, weight loss, swollen lymph nodes, body aches, joint swelling, chest pain, shortness of breath, mood changes. POSITIVE muscle aches  Objective  There were no vitals taken for this visit.   General: No apparent distress alert and oriented x3 mood and affect normal, dressed appropriately.  HEENT: Pupils equal, extraocular movements intact  Respiratory: Patient's speak in full sentences and does not appear short of breath  Cardiovascular: No lower extremity edema, non tender, no erythema  Gait MSK:  Back   Osteopathic findings  C2 flexed rotated and side bent right C6 flexed rotated and side bent left T3 extended rotated and side bent right inhaled rib T9 extended rotated and side bent left L2 flexed rotated and side bent right Sacrum right on right       Assessment and Plan:  No problem-specific Assessment & Plan notes  found for this encounter.    Nonallopathic problems  Decision today to treat with OMT was based on Physical Exam  After verbal consent patient was treated with HVLA, ME, FPR techniques in cervical, rib, thoracic, lumbar, and sacral  areas  Patient tolerated the procedure well with improvement in symptoms  Patient given exercises, stretches and lifestyle modifications  See medications in patient instructions if given  Patient will follow up in 4-8 weeks             Note: This dictation was prepared with Dragon dictation along with smaller phrase technology. Any transcriptional errors that result from this process are unintentional.            [1]  Allergies Allergen Reactions   Amlodipine  Swelling    Significant ankle edema   "

## 2024-12-22 ENCOUNTER — Ambulatory Visit: Admitting: Family Medicine

## 2024-12-24 NOTE — Progress Notes (Unsigned)
 " [name] [address] [place] [address] Phone: [phone] Subjective:   Larry [name], am serving as a scribe for Dr. Arthea [name].  I'm seeing this patient by the request  of:  Rice, Elsie GRADE, MD  CC: Back, neck and wrist pain  YEP:Dlagzrupcz  Larry [name] is a 64 y.o. male coming in with complaint of back and neck pain. OMT on 10/21/2024. Also having carpal tunnel issues. Patient states has been wearing the brace at night. The pain in the elbow is still there and will get a zing with certain ROM.  Medications patient has been prescribed:   Taking:         Reviewed prior external information including notes and imaging from previsou exam, outside providers and external EMR if available.   As well as notes that were available from care everywhere and other healthcare systems.  Past medical history, social, surgical and family history all reviewed in electronic medical record.  No pertanent information unless stated regarding to the chief complaint.   Past Medical History:  Diagnosis Date   High cholesterol    Hyperlipidemia    Hypertension     Allergies[1]   Review of Systems:  No headache, visual changes, nausea, vomiting, diarrhea, constipation, dizziness, abdominal pain, skin rash, fevers, chills, night sweats, weight loss, swollen lymph nodes, body aches, joint swelling, chest pain, shortness of breath, mood changes. POSITIVE muscle aches  Objective  Blood pressure 108/68, pulse 79, height 6' 1 (1.854 m), weight 198 lb (89.8 kg), SpO2 98%.   General: No apparent distress alert and oriented x3 mood and affect normal, dressed appropriately.  HEENT: Pupils equal, extraocular movements intact  Respiratory: Patient's speak in full sentences and does not appear short of breath  Cardiovascular: No lower extremity edema, non tender, no erythema  Gait MSK:  Back does have some mild loss of lordosis.  Significant  stiffness noted also actually at this point.  Tightness with FABER right greater than left.  Negative straight leg test noted. Right elbow still has severe tenderness noted as well.  Tenderness over the lateral epicondylar area.  Osteopathic findings  C2 flexed rotated and side bent right C6 flexed rotated and side bent left T3 extended rotated and side bent right inhaled rib T9 extended rotated and side bent left L2 flexed rotated and side bent right L4 F RS right  Sacrum right on right  Limited muscular skeletal ultrasound was performed and interpreted by [name], M  Lateral epicondylar region shows hypoechoic changes laterally but the tendon appears to be intact of the common extensor tendon.  Mild increase in Doppler flow in the surrounding area. Impression: Mild to moderate lateral epicondylitis   Assessment and Plan:  Right lateral epicondylitis Continues to have some lateral epicondylitis.  We discussed icing regimen and home exercises, discussed which activities to do and which ones to avoid.  Patient would not want any type of injection.  We would need advanced imaging otherwise.  Started nitroglycerin  and warned potential side effects.  Patient does have a prescription for Viagra  but does not take it regularly.  Understands not to do both at the same time and we need to discontinue the nitroglycerin  for 48 hours before use.  Patient understands.  Follow-up again in 6 to 8 weeks otherwise.    Nonallopathic problems  Decision today to treat with OMT was based on Physical Exam  After verbal consent patient was treated with HVLA, ME,  FPR techniques in cervical, rib, thoracic, lumbar, and sacral  areas  Patient tolerated the procedure well with improvement in symptoms  Patient given exercises, stretches and lifestyle modifications  See medications in patient instructions if given  Patient will follow up in 4-8 weeks    The above documentation has been reviewed and is  accurate and complete Arthea CHRISTELLA Sharps, DO          Note: This dictation was prepared with Dragon dictation along with smaller phrase technology. Any transcriptional errors that result from this process are unintentional.            [1]  Allergies Allergen Reactions   Amlodipine  Swelling    Significant ankle edema   "

## 2024-12-25 ENCOUNTER — Ambulatory Visit: Admitting: Family Medicine

## 2024-12-25 ENCOUNTER — Other Ambulatory Visit: Payer: Self-pay

## 2024-12-25 ENCOUNTER — Encounter: Payer: Self-pay | Admitting: Family Medicine

## 2024-12-25 ENCOUNTER — Other Ambulatory Visit (HOSPITAL_COMMUNITY): Payer: Self-pay

## 2024-12-25 VITALS — BP 108/68 | HR 79 | Ht 73.0 in | Wt 198.0 lb

## 2024-12-25 DIAGNOSIS — M9908 Segmental and somatic dysfunction of rib cage: Secondary | ICD-10-CM | POA: Diagnosis not present

## 2024-12-25 DIAGNOSIS — M9903 Segmental and somatic dysfunction of lumbar region: Secondary | ICD-10-CM | POA: Diagnosis not present

## 2024-12-25 DIAGNOSIS — M7711 Lateral epicondylitis, right elbow: Secondary | ICD-10-CM

## 2024-12-25 DIAGNOSIS — M9904 Segmental and somatic dysfunction of sacral region: Secondary | ICD-10-CM | POA: Diagnosis not present

## 2024-12-25 DIAGNOSIS — M9901 Segmental and somatic dysfunction of cervical region: Secondary | ICD-10-CM

## 2024-12-25 DIAGNOSIS — M25531 Pain in right wrist: Secondary | ICD-10-CM

## 2024-12-25 DIAGNOSIS — M9902 Segmental and somatic dysfunction of thoracic region: Secondary | ICD-10-CM

## 2024-12-25 DIAGNOSIS — M5416 Radiculopathy, lumbar region: Secondary | ICD-10-CM | POA: Diagnosis not present

## 2024-12-25 MED ORDER — NITROGLYCERIN 0.2 MG/HR TD PT24
MEDICATED_PATCH | TRANSDERMAL | 0 refills | Status: AC
  Filled 2024-12-25: qty 30, 30d supply, fill #0

## 2024-12-25 NOTE — Assessment & Plan Note (Signed)
 Continues to have some lateral epicondylitis.  We discussed icing regimen and home exercises, discussed which activities to do and which ones to avoid.  Patient would not want any type of injection.  We would need advanced imaging otherwise.  Started nitroglycerin  and warned potential side effects.  Patient does have a prescription for Viagra  but does not take it regularly.  Understands not to do both at the same time and we need to discontinue the nitroglycerin  for 48 hours before use.  Patient understands.  Follow-up again in 6 to 8 weeks otherwise.

## 2024-12-25 NOTE — Assessment & Plan Note (Signed)
 Chronic problem unstable.  Significant stiffness though in the upper back more than patient's baseline.  Patient's lower back significant tightness as well in the thoracolumbar juncture.  Discussed icing regimen and home exercises, discussed which activities to do and which ones to avoid.  Increase activity slowly.  Discussed icing regimen.  Follow-up again in 6 to 12 weeks.

## 2024-12-25 NOTE — Patient Instructions (Signed)
 Nitroglycerin  Protocol   Apply 1/4 nitroglycerin  patch to affected area daily.  Change position of patch within the affected area every 24 hours.  You may experience a headache during the first 1-2 weeks of using the patch, these should subside.  If you experience headaches after beginning nitroglycerin  patch treatment, you may take your preferred over the counter pain reliever.  Another side effect of the nitroglycerin  patch is skin irritation or rash related to patch adhesive.  Please notify our office if you develop more severe headaches or rash, and stop the patch.  Tendon healing with nitroglycerin  patch may require 12 to 24 weeks depending on the extent of injury.  Men should not use if taking Viagra , Cialis, or Levitra.   Do not use if you have migraines or rosacea.  Try the mouse again Stay active See you again in 7-8 weeks

## 2024-12-29 ENCOUNTER — Other Ambulatory Visit (HOSPITAL_COMMUNITY): Payer: Self-pay

## 2024-12-29 MED ORDER — ATORVASTATIN CALCIUM 20 MG PO TABS
20.0000 mg | ORAL_TABLET | Freq: Every day | ORAL | 3 refills | Status: AC
  Filled 2024-12-29: qty 90, 90d supply, fill #0

## 2024-12-30 ENCOUNTER — Other Ambulatory Visit (HOSPITAL_COMMUNITY): Payer: Self-pay

## 2025-01-01 ENCOUNTER — Other Ambulatory Visit (HOSPITAL_COMMUNITY): Payer: Self-pay

## 2025-01-01 MED ORDER — ATORVASTATIN CALCIUM 20 MG PO TABS
ORAL_TABLET | ORAL | 3 refills | Status: AC
  Filled 2025-01-01 – 2025-01-02 (×2): qty 90, 90d supply, fill #0

## 2025-01-02 ENCOUNTER — Other Ambulatory Visit: Payer: Self-pay

## 2025-02-05 ENCOUNTER — Ambulatory Visit: Admitting: Family Medicine

## 2025-02-13 ENCOUNTER — Ambulatory Visit: Admitting: Family Medicine
